# Patient Record
Sex: Female | Born: 1935 | Race: White | Hispanic: No | Marital: Married | Smoking: Never smoker
Health system: Southern US, Community
[De-identification: ages and names within clinical notes are randomized; demographics above are authoritative.]

## PROBLEM LIST (undated history)

## (undated) DIAGNOSIS — F419 Anxiety disorder, unspecified: Secondary | ICD-10-CM

## (undated) DIAGNOSIS — Z923 Personal history of irradiation: Secondary | ICD-10-CM

## (undated) DIAGNOSIS — I447 Left bundle-branch block, unspecified: Secondary | ICD-10-CM

## (undated) DIAGNOSIS — K219 Gastro-esophageal reflux disease without esophagitis: Secondary | ICD-10-CM

## (undated) DIAGNOSIS — M1990 Unspecified osteoarthritis, unspecified site: Secondary | ICD-10-CM

## (undated) DIAGNOSIS — J3089 Other allergic rhinitis: Secondary | ICD-10-CM

## (undated) DIAGNOSIS — C801 Malignant (primary) neoplasm, unspecified: Secondary | ICD-10-CM

## (undated) DIAGNOSIS — Z9221 Personal history of antineoplastic chemotherapy: Secondary | ICD-10-CM

## (undated) DIAGNOSIS — G629 Polyneuropathy, unspecified: Secondary | ICD-10-CM

## (undated) DIAGNOSIS — Z853 Personal history of malignant neoplasm of breast: Secondary | ICD-10-CM

## (undated) DIAGNOSIS — C50919 Malignant neoplasm of unspecified site of unspecified female breast: Secondary | ICD-10-CM

## (undated) DIAGNOSIS — E785 Hyperlipidemia, unspecified: Secondary | ICD-10-CM

## (undated) DIAGNOSIS — I10 Essential (primary) hypertension: Secondary | ICD-10-CM

## (undated) HISTORY — PX: BREAST SURGERY: SHX581

## (undated) HISTORY — PX: EYE SURGERY: SHX253

## (undated) HISTORY — DX: Personal history of malignant neoplasm of breast: Z85.3

## (undated) HISTORY — PX: DILATION AND CURETTAGE OF UTERUS: SHX78

## (undated) HISTORY — PX: TONSILLECTOMY: SUR1361

## (undated) HISTORY — PX: BACK SURGERY: SHX140

## (undated) HISTORY — PX: CARDIAC CATHETERIZATION: SHX172

---
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
## 1995-03-22 DIAGNOSIS — C50919 Malignant neoplasm of unspecified site of unspecified female breast: Secondary | ICD-10-CM

## 1995-03-22 DIAGNOSIS — Z923 Personal history of irradiation: Secondary | ICD-10-CM

## 1995-03-22 DIAGNOSIS — Z9221 Personal history of antineoplastic chemotherapy: Secondary | ICD-10-CM

## 1995-03-22 HISTORY — DX: Malignant neoplasm of unspecified site of unspecified female breast: C50.919

## 1995-03-22 HISTORY — DX: Personal history of antineoplastic chemotherapy: Z92.21

## 1995-03-22 HISTORY — PX: MASTECTOMY: SHX3

## 1995-03-22 HISTORY — DX: Personal history of irradiation: Z92.3

## 2004-02-27 ENCOUNTER — Ambulatory Visit: Payer: Self-pay | Admitting: General Surgery

## 2004-03-24 ENCOUNTER — Ambulatory Visit: Payer: Self-pay | Admitting: Oncology

## 2004-04-21 ENCOUNTER — Ambulatory Visit: Payer: Self-pay | Admitting: Oncology

## 2004-09-27 ENCOUNTER — Ambulatory Visit: Payer: Self-pay | Admitting: Oncology

## 2004-10-19 ENCOUNTER — Ambulatory Visit: Payer: Self-pay | Admitting: Oncology

## 2005-03-28 ENCOUNTER — Ambulatory Visit: Payer: Self-pay | Admitting: General Surgery

## 2005-03-30 ENCOUNTER — Ambulatory Visit: Payer: Self-pay | Admitting: Oncology

## 2005-04-21 ENCOUNTER — Ambulatory Visit: Payer: Self-pay | Admitting: Oncology

## 2005-09-26 ENCOUNTER — Ambulatory Visit: Payer: Self-pay | Admitting: Oncology

## 2005-10-19 ENCOUNTER — Ambulatory Visit: Payer: Self-pay | Admitting: Oncology

## 2005-11-19 ENCOUNTER — Ambulatory Visit: Payer: Self-pay | Admitting: Oncology

## 2006-01-19 ENCOUNTER — Ambulatory Visit: Payer: Self-pay | Admitting: Oncology

## 2006-01-24 ENCOUNTER — Ambulatory Visit: Payer: Self-pay | Admitting: Oncology

## 2006-04-03 ENCOUNTER — Ambulatory Visit: Payer: Self-pay | Admitting: General Surgery

## 2006-04-12 ENCOUNTER — Ambulatory Visit: Payer: Self-pay | Admitting: Oncology

## 2006-04-21 ENCOUNTER — Ambulatory Visit: Payer: Self-pay | Admitting: Oncology

## 2006-09-19 ENCOUNTER — Ambulatory Visit: Payer: Self-pay | Admitting: Oncology

## 2006-10-09 ENCOUNTER — Ambulatory Visit: Payer: Self-pay | Admitting: Oncology

## 2006-10-20 ENCOUNTER — Ambulatory Visit: Payer: Self-pay | Admitting: Oncology

## 2007-03-22 ENCOUNTER — Ambulatory Visit: Payer: Self-pay | Admitting: Oncology

## 2007-03-30 ENCOUNTER — Ambulatory Visit: Payer: Self-pay | Admitting: General Surgery

## 2007-04-18 ENCOUNTER — Ambulatory Visit: Payer: Self-pay | Admitting: Oncology

## 2007-04-22 ENCOUNTER — Ambulatory Visit: Payer: Self-pay | Admitting: Oncology

## 2007-05-15 ENCOUNTER — Inpatient Hospital Stay: Payer: Self-pay | Admitting: Internal Medicine

## 2007-05-15 ENCOUNTER — Other Ambulatory Visit: Payer: Self-pay

## 2007-06-21 ENCOUNTER — Ambulatory Visit: Payer: Self-pay | Admitting: General Surgery

## 2007-09-19 ENCOUNTER — Ambulatory Visit: Payer: Self-pay | Admitting: Oncology

## 2007-10-09 ENCOUNTER — Ambulatory Visit: Payer: Self-pay | Admitting: Internal Medicine

## 2008-04-04 ENCOUNTER — Ambulatory Visit: Payer: Self-pay | Admitting: General Surgery

## 2008-04-21 ENCOUNTER — Ambulatory Visit: Payer: Self-pay | Admitting: Oncology

## 2008-05-16 ENCOUNTER — Ambulatory Visit: Payer: Self-pay | Admitting: Oncology

## 2008-05-19 ENCOUNTER — Ambulatory Visit: Payer: Self-pay | Admitting: Oncology

## 2009-03-30 ENCOUNTER — Ambulatory Visit: Payer: Self-pay | Admitting: Ophthalmology

## 2009-04-06 ENCOUNTER — Ambulatory Visit: Payer: Self-pay | Admitting: Ophthalmology

## 2009-04-21 ENCOUNTER — Ambulatory Visit: Payer: Self-pay | Admitting: General Surgery

## 2009-05-19 ENCOUNTER — Ambulatory Visit: Payer: Self-pay | Admitting: Oncology

## 2009-06-08 ENCOUNTER — Ambulatory Visit: Payer: Self-pay | Admitting: Oncology

## 2009-08-02 ENCOUNTER — Ambulatory Visit: Payer: Self-pay

## 2010-03-23 ENCOUNTER — Ambulatory Visit: Payer: Self-pay | Admitting: Internal Medicine

## 2010-04-01 ENCOUNTER — Ambulatory Visit: Payer: Self-pay | Admitting: Internal Medicine

## 2010-04-19 ENCOUNTER — Ambulatory Visit: Payer: Self-pay | Admitting: Oncology

## 2010-04-21 ENCOUNTER — Ambulatory Visit: Payer: Self-pay | Admitting: Internal Medicine

## 2010-04-26 ENCOUNTER — Ambulatory Visit: Payer: Self-pay | Admitting: General Surgery

## 2010-04-27 ENCOUNTER — Ambulatory Visit: Payer: Self-pay | Admitting: Internal Medicine

## 2010-05-20 ENCOUNTER — Ambulatory Visit: Payer: Self-pay | Admitting: Internal Medicine

## 2010-05-24 ENCOUNTER — Ambulatory Visit: Payer: Self-pay

## 2010-06-20 ENCOUNTER — Ambulatory Visit: Payer: Self-pay | Admitting: Internal Medicine

## 2010-11-26 ENCOUNTER — Ambulatory Visit: Payer: Self-pay | Admitting: Oncology

## 2010-12-20 ENCOUNTER — Ambulatory Visit: Payer: Self-pay | Admitting: Oncology

## 2011-03-10 ENCOUNTER — Ambulatory Visit: Payer: Self-pay | Admitting: Oncology

## 2011-03-22 ENCOUNTER — Ambulatory Visit: Payer: Self-pay | Admitting: Oncology

## 2011-05-03 ENCOUNTER — Ambulatory Visit: Payer: Self-pay | Admitting: Oncology

## 2011-09-13 ENCOUNTER — Ambulatory Visit: Payer: Self-pay | Admitting: Oncology

## 2011-09-13 LAB — COMPREHENSIVE METABOLIC PANEL
Alkaline Phosphatase: 107 U/L (ref 50–136)
Anion Gap: 8 (ref 7–16)
Bilirubin,Total: 0.5 mg/dL (ref 0.2–1.0)
Co2: 28 mmol/L (ref 21–32)
Creatinine: 0.88 mg/dL (ref 0.60–1.30)
EGFR (African American): >60
EGFR (Non-African Amer.): >60
Glucose: 92 mg/dL (ref 65–99)
Osmolality: 275 (ref 275–301)
Potassium: 4.2 mmol/L (ref 3.5–5.1)
SGPT (ALT): 19 U/L
Sodium: 136 mmol/L (ref 136–145)

## 2011-09-13 LAB — CBC CANCER CENTER
Basophil #: 0.1 x10 3/mm (ref 0.0–0.1)
Eosinophil #: 0.2 x10 3/mm (ref 0.0–0.7)
HCT: 36.7 % (ref 35.0–47.0)
HGB: 11.9 g/dL — ABNORMAL LOW (ref 12.0–16.0)
Lymphocyte %: 21.7 %
MCH: 27.6 pg (ref 26.0–34.0)
MCHC: 32.5 g/dL (ref 32.0–36.0)
MCV: 85 fL (ref 80–100)
Monocyte #: 0.8 x10 3/mm (ref 0.2–0.9)
Monocyte %: 12.6 %
Neutrophil #: 3.8 x10 3/mm (ref 1.4–6.5)
Neutrophil %: 61 %
Platelet: 308 x10 3/mm (ref 150–440)
RBC: 4.32 10*6/uL (ref 3.80–5.20)
WBC: 6.3 x10 3/mm (ref 3.6–11.0)

## 2011-09-14 LAB — PROT IMMUNOELECTROPHORES(ARMC)

## 2011-09-19 ENCOUNTER — Ambulatory Visit: Payer: Self-pay | Admitting: Oncology

## 2011-10-20 ENCOUNTER — Ambulatory Visit: Payer: Self-pay | Admitting: Oncology

## 2011-12-08 ENCOUNTER — Ambulatory Visit: Payer: Self-pay | Admitting: Oncology

## 2012-03-27 ENCOUNTER — Ambulatory Visit: Payer: Self-pay | Admitting: Oncology

## 2012-03-27 LAB — CREATININE, SERUM: EGFR (Non-African Amer.): >60

## 2012-03-27 LAB — CANCER CENTER HEMOGLOBIN: HGB: 12.9 g/dL (ref 12.0–16.0)

## 2012-03-29 LAB — PROT IMMUNOELECTROPHORES(ARMC)

## 2012-04-21 ENCOUNTER — Ambulatory Visit: Payer: Self-pay | Admitting: Oncology

## 2012-05-19 ENCOUNTER — Ambulatory Visit: Payer: Self-pay | Admitting: Oncology

## 2012-09-18 ENCOUNTER — Ambulatory Visit: Payer: Self-pay | Admitting: Oncology

## 2012-09-27 LAB — CBC CANCER CENTER
Basophil %: 0.8 %
Eosinophil #: 0.2 x10 3/mm (ref 0.0–0.7)
HGB: 12 g/dL (ref 12.0–16.0)
Lymphocyte #: 1.1 x10 3/mm (ref 1.0–3.6)
Lymphocyte %: 16.8 %
MCH: 29.3 pg (ref 26.0–34.0)
MCHC: 34.7 g/dL (ref 32.0–36.0)
MCV: 85 fL (ref 80–100)
Monocyte #: 0.8 x10 3/mm (ref 0.2–0.9)
Monocyte %: 11.3 %
Neutrophil %: 68.3 %
RBC: 4.11 10*6/uL (ref 3.80–5.20)
RDW: 13.5 % (ref 11.5–14.5)
WBC: 6.7 x10 3/mm (ref 3.6–11.0)

## 2012-09-27 LAB — COMPREHENSIVE METABOLIC PANEL
Alkaline Phosphatase: 102 U/L (ref 50–136)
Anion Gap: 3 — ABNORMAL LOW (ref 7–16)
BUN: 17 mg/dL (ref 7–18)
Chloride: 98 mmol/L (ref 98–107)
Co2: 31 mmol/L (ref 21–32)
Creatinine: 0.87 mg/dL (ref 0.60–1.30)
EGFR (African American): >60
EGFR (Non-African Amer.): >60
Glucose: 103 mg/dL — ABNORMAL HIGH (ref 65–99)
Osmolality: 266 (ref 275–301)
SGOT(AST): 13 U/L — ABNORMAL LOW (ref 15–37)
SGPT (ALT): 17 U/L (ref 12–78)

## 2012-10-02 LAB — PROT IMMUNOELECTROPHORES(ARMC)

## 2012-10-19 ENCOUNTER — Ambulatory Visit: Payer: Self-pay | Admitting: Oncology

## 2013-04-04 ENCOUNTER — Ambulatory Visit: Payer: Self-pay | Admitting: Ophthalmology

## 2013-04-08 ENCOUNTER — Ambulatory Visit: Payer: Self-pay | Admitting: Ophthalmology

## 2013-04-28 ENCOUNTER — Inpatient Hospital Stay: Payer: Self-pay | Admitting: Internal Medicine

## 2013-04-28 LAB — CK TOTAL AND CKMB (NOT AT ARMC)
CK, TOTAL: 54 U/L
CK, Total: 49 U/L
CK-MB: <0.5 ng/mL — ABNORMAL LOW (ref 0.5–3.6)

## 2013-04-28 LAB — COMPREHENSIVE METABOLIC PANEL
ALBUMIN: 3.2 g/dL — AB (ref 3.4–5.0)
ALK PHOS: 98 U/L
Anion Gap: 8 (ref 7–16)
BUN: 14 mg/dL (ref 7–18)
Bilirubin,Total: 0.8 mg/dL (ref 0.2–1.0)
CALCIUM: 9.5 mg/dL (ref 8.5–10.1)
CHLORIDE: 100 mmol/L (ref 98–107)
CREATININE: 0.88 mg/dL (ref 0.60–1.30)
Co2: 24 mmol/L (ref 21–32)
EGFR (African American): >60
Glucose: 116 mg/dL — ABNORMAL HIGH (ref 65–99)
Osmolality: 266 (ref 275–301)
POTASSIUM: 3.7 mmol/L (ref 3.5–5.1)
SGOT(AST): 26 U/L (ref 15–37)
SGPT (ALT): 33 U/L (ref 12–78)
SODIUM: 132 mmol/L — AB (ref 136–145)
Total Protein: 7.7 g/dL (ref 6.4–8.2)

## 2013-04-28 LAB — URINALYSIS, COMPLETE
BACTERIA: NEGATIVE
BILIRUBIN, UR: NEGATIVE
BLOOD: NEGATIVE
GLUCOSE, UR: NEGATIVE mg/dL (ref 0–75)
Ketone: NEGATIVE
LEUKOCYTE ESTERASE: NEGATIVE
Nitrite: NEGATIVE
PH: 6 (ref 4.5–8.0)
PROTEIN: NEGATIVE
Specific Gravity: 1.017 (ref 1.003–1.030)

## 2013-04-28 LAB — CBC
HCT: 36.5 % (ref 35.0–47.0)
HGB: 12 g/dL (ref 12.0–16.0)
MCH: 28.3 pg (ref 26.0–34.0)
MCHC: 32.9 g/dL (ref 32.0–36.0)
MCV: 86 fL (ref 80–100)
Platelet: 297 10*3/uL (ref 150–440)
RBC: 4.24 10*6/uL (ref 3.80–5.20)
RDW: 13.2 % (ref 11.5–14.5)
WBC: 20.7 10*3/uL — AB (ref 3.6–11.0)

## 2013-04-28 LAB — TROPONIN I
Troponin-I: <0.02 ng/mL
Troponin-I: <0.02 ng/mL

## 2013-04-29 LAB — CBC WITH DIFFERENTIAL/PLATELET
Basophil #: 0.1 10*3/uL (ref 0.0–0.1)
Basophil %: 0.8 %
EOS ABS: 0.1 10*3/uL (ref 0.0–0.7)
Eosinophil %: 0.6 %
HCT: 29.7 % — ABNORMAL LOW (ref 35.0–47.0)
HGB: 10.1 g/dL — ABNORMAL LOW (ref 12.0–16.0)
Lymphocyte #: 0.6 10*3/uL — ABNORMAL LOW (ref 1.0–3.6)
Lymphocyte %: 6 %
MCH: 29.5 pg (ref 26.0–34.0)
MCHC: 34 g/dL (ref 32.0–36.0)
MCV: 87 fL (ref 80–100)
Monocyte #: 1.1 x10 3/mm — ABNORMAL HIGH (ref 0.2–0.9)
Monocyte %: 10.4 %
NEUTROS PCT: 82.2 %
Neutrophil #: 8.7 10*3/uL — ABNORMAL HIGH (ref 1.4–6.5)
Platelet: 195 10*3/uL (ref 150–440)
RBC: 3.42 10*6/uL — ABNORMAL LOW (ref 3.80–5.20)
RDW: 13.4 % (ref 11.5–14.5)
WBC: 10.5 10*3/uL (ref 3.6–11.0)

## 2013-04-29 LAB — BASIC METABOLIC PANEL
ANION GAP: 4 — AB (ref 7–16)
BUN: 17 mg/dL (ref 7–18)
CO2: 23 mmol/L (ref 21–32)
Calcium, Total: 8.5 mg/dL (ref 8.5–10.1)
Chloride: 104 mmol/L (ref 98–107)
Creatinine: 0.99 mg/dL (ref 0.60–1.30)
EGFR (African American): >60
EGFR (Non-African Amer.): 54 — ABNORMAL LOW
GLUCOSE: 107 mg/dL — AB (ref 65–99)
OSMOLALITY: 265 (ref 275–301)
POTASSIUM: 3.6 mmol/L (ref 3.5–5.1)
Sodium: 131 mmol/L — ABNORMAL LOW (ref 136–145)

## 2013-05-03 LAB — CULTURE, BLOOD (SINGLE)

## 2013-05-28 ENCOUNTER — Ambulatory Visit: Payer: Self-pay | Admitting: Oncology

## 2013-06-07 ENCOUNTER — Emergency Department: Payer: Self-pay | Admitting: Emergency Medicine

## 2013-08-27 DIAGNOSIS — M1990 Unspecified osteoarthritis, unspecified site: Secondary | ICD-10-CM | POA: Insufficient documentation

## 2013-10-21 ENCOUNTER — Ambulatory Visit: Payer: Self-pay | Admitting: Oncology

## 2013-10-21 LAB — CBC CANCER CENTER
BASOS ABS: 0.1 x10 3/mm (ref 0.0–0.1)
Basophil %: 1 %
EOS ABS: 0.1 x10 3/mm (ref 0.0–0.7)
EOS PCT: 0.9 %
HCT: 35.5 % (ref 35.0–47.0)
HGB: 11.9 g/dL — AB (ref 12.0–16.0)
Lymphocyte #: 1.4 x10 3/mm (ref 1.0–3.6)
Lymphocyte %: 14.4 %
MCH: 29.4 pg (ref 26.0–34.0)
MCHC: 33.5 g/dL (ref 32.0–36.0)
MCV: 88 fL (ref 80–100)
Monocyte #: 0.9 x10 3/mm (ref 0.2–0.9)
Monocyte %: 9.4 %
NEUTROS ABS: 7.2 x10 3/mm — AB (ref 1.4–6.5)
NEUTROS PCT: 74.3 %
PLATELETS: 392 x10 3/mm (ref 150–440)
RBC: 4.04 10*6/uL (ref 3.80–5.20)
RDW: 13.7 % (ref 11.5–14.5)
WBC: 9.6 x10 3/mm (ref 3.6–11.0)

## 2013-10-21 LAB — COMPREHENSIVE METABOLIC PANEL
ALBUMIN: 3.3 g/dL — AB (ref 3.4–5.0)
ANION GAP: 7 (ref 7–16)
Alkaline Phosphatase: 83 U/L
BUN: 18 mg/dL (ref 7–18)
Bilirubin,Total: 0.5 mg/dL (ref 0.2–1.0)
CHLORIDE: 99 mmol/L (ref 98–107)
CO2: 28 mmol/L (ref 21–32)
Calcium, Total: 9.9 mg/dL (ref 8.5–10.1)
Creatinine: 0.95 mg/dL (ref 0.60–1.30)
GFR CALC NON AF AMER: 57 — AB
GLUCOSE: 103 mg/dL — AB (ref 65–99)
Osmolality: 270 (ref 275–301)
Potassium: 3.9 mmol/L (ref 3.5–5.1)
SGOT(AST): 16 U/L (ref 15–37)
SGPT (ALT): 25 U/L
SODIUM: 134 mmol/L — AB (ref 136–145)
Total Protein: 7.7 g/dL (ref 6.4–8.2)

## 2013-10-24 LAB — PROT IMMUNOELECTROPHORES(ARMC)

## 2013-10-24 LAB — KAPPA/LAMBDA FREE LIGHT CHAINS (ARMC)

## 2013-11-19 ENCOUNTER — Ambulatory Visit: Payer: Self-pay | Admitting: Oncology

## 2013-11-30 DIAGNOSIS — E785 Hyperlipidemia, unspecified: Secondary | ICD-10-CM | POA: Insufficient documentation

## 2013-11-30 DIAGNOSIS — G43909 Migraine, unspecified, not intractable, without status migrainosus: Secondary | ICD-10-CM | POA: Insufficient documentation

## 2013-11-30 DIAGNOSIS — M159 Polyosteoarthritis, unspecified: Secondary | ICD-10-CM | POA: Insufficient documentation

## 2013-11-30 DIAGNOSIS — I10 Essential (primary) hypertension: Secondary | ICD-10-CM | POA: Insufficient documentation

## 2014-02-02 ENCOUNTER — Observation Stay: Payer: Self-pay | Admitting: Internal Medicine

## 2014-02-02 LAB — CBC WITH DIFFERENTIAL/PLATELET
Basophil #: 0.1 10*3/uL (ref 0.0–0.1)
Basophil %: 0.9 %
EOS ABS: 0.1 10*3/uL (ref 0.0–0.7)
Eosinophil %: 0.9 %
HCT: 38.2 % (ref 35.0–47.0)
HGB: 13 g/dL (ref 12.0–16.0)
LYMPHS ABS: 2.3 10*3/uL (ref 1.0–3.6)
LYMPHS PCT: 19.9 %
MCH: 30.2 pg (ref 26.0–34.0)
MCHC: 34.1 g/dL (ref 32.0–36.0)
MCV: 89 fL (ref 80–100)
Monocyte #: 1.1 x10 3/mm — ABNORMAL HIGH (ref 0.2–0.9)
Monocyte %: 9.5 %
Neutrophil #: 8 10*3/uL — ABNORMAL HIGH (ref 1.4–6.5)
Neutrophil %: 68.8 %
PLATELETS: 375 10*3/uL (ref 150–440)
RBC: 4.31 10*6/uL (ref 3.80–5.20)
RDW: 13.2 % (ref 11.5–14.5)
WBC: 11.7 10*3/uL — AB (ref 3.6–11.0)

## 2014-02-02 LAB — BASIC METABOLIC PANEL
ANION GAP: 11 (ref 7–16)
BUN: 22 mg/dL — AB (ref 7–18)
Calcium, Total: 9.3 mg/dL (ref 8.5–10.1)
Chloride: 100 mmol/L (ref 98–107)
Co2: 21 mmol/L (ref 21–32)
Creatinine: 0.94 mg/dL (ref 0.60–1.30)
EGFR (African American): >60
EGFR (Non-African Amer.): >60
GLUCOSE: 107 mg/dL — AB (ref 65–99)
OSMOLALITY: 268 (ref 275–301)
Potassium: 4.3 mmol/L (ref 3.5–5.1)
SODIUM: 132 mmol/L — AB (ref 136–145)

## 2014-02-02 LAB — URINALYSIS, COMPLETE
BILIRUBIN, UR: NEGATIVE
Blood: NEGATIVE
GLUCOSE, UR: NEGATIVE mg/dL (ref 0–75)
Ketone: NEGATIVE
Nitrite: NEGATIVE
PH: 6 (ref 4.5–8.0)
Protein: NEGATIVE
RBC,UR: 5 /HPF (ref 0–5)
Specific Gravity: 1.017 (ref 1.003–1.030)
Transitional Epi: <1
WBC UR: 10 /HPF (ref 0–5)

## 2014-02-02 LAB — CK-MB: CK-MB: 0.7 ng/mL (ref 0.5–3.6)

## 2014-02-02 LAB — TROPONIN I

## 2014-02-03 LAB — HEMOGLOBIN A1C: Hemoglobin A1C: 6.1 % (ref 4.2–6.3)

## 2014-02-03 LAB — TROPONIN I: Troponin-I: <0.02 ng/mL

## 2014-02-03 LAB — CK-MB
CK-MB: 0.8 ng/mL (ref 0.5–3.6)
CK-MB: 1.1 ng/mL (ref 0.5–3.6)

## 2014-02-03 LAB — TSH: Thyroid Stimulating Horm: 1.7 u[IU]/mL

## 2014-03-21 HISTORY — PX: KYPHOPLASTY: SHX5884

## 2014-05-13 ENCOUNTER — Ambulatory Visit: Payer: Self-pay | Admitting: Physician Assistant

## 2014-05-19 ENCOUNTER — Ambulatory Visit: Payer: Self-pay | Admitting: Orthopedic Surgery

## 2014-05-20 ENCOUNTER — Ambulatory Visit: Payer: Self-pay | Admitting: Orthopedic Surgery

## 2014-06-17 DIAGNOSIS — C8800 Waldenstrom macroglobulinemia not having achieved remission: Secondary | ICD-10-CM | POA: Insufficient documentation

## 2014-06-17 DIAGNOSIS — M810 Age-related osteoporosis without current pathological fracture: Secondary | ICD-10-CM | POA: Insufficient documentation

## 2014-06-25 ENCOUNTER — Ambulatory Visit
Admit: 2014-06-25 | Disposition: A | Discharge status: Home / Self Care | Payer: Self-pay | Attending: Internal Medicine | Admitting: Internal Medicine

## 2014-07-12 NOTE — Discharge Summary (Signed)
PATIENT NAME:  Carly Peck, Carly Peck MR#:  [id number] DATE OF BIRTH:  08/03/1931  DATE OF ADMISSION:  04/28/2013 DATE OF DISCHARGE: 04/30/2013   DISCHARGE DIAGNOSES:  1.  Cellulitis. 2.  Sepsis syndrome.  3.  Syncope from above. 4.  History of osteoarthritis.  5.  Hypotension associated with the above, now improved.   DISCHARGE MEDICATIONS: Per [facility] medication reconciliation form. Please see for details.   HISTORY AND PHYSICAL: Please see detailed history and physical done on admission.   HOSPITAL COURSE: The patient was admitted hypotensive, status post syncopal event and cellulitis on her left leg after a cat bite at home. She was put on multiple broad-spectrum antibiotics on admission. She had some nausea with that, which was relieved with supplying her  regimen. White blood cell count was initially 20,000 and improved to normal by the time of discharge. She had a echocardiogram, which showed normal LV function. She had minimal carotid plaques on a carotid ultrasound. She was ambulating in her room to the bathroom without difficulty. She was discharged home as long as she tolerates her breakfast and lunch today to ensure she is eating well and her blood pressure stays in the normal range on just losartan. She will take Augmentin 875 b.i.d. for a week further prior to discharge.   TIME SPENT: Please note, it took approximately 33 minutes to do all discharge tasks today.  ____________________________ Ocie Cornfield. Ouida Sills, MD mwa:aw D: 04/30/2013 08:02:13 ET T: 04/30/2013 09:04:05 ET JOB#: [id number]  cc: Ocie Cornfield. Ouida Sills, MD, <Dictator> Kirk Ruths MD ELECTRONICALLY SIGNED 05/01/2013 14:04

## 2014-07-12 NOTE — H&P (Signed)
PATIENT NAME:  Carly Peck, Carly Peck MR#:  [id number] DATE OF BIRTH:  09-17-1931  DATE OF ADMISSION:  04/28/2013  REFERRING PHYSICIAN: Dr. Benjaman Lobe.   FAMILY PHYSICIAN: Dr. Frazier Richards.   REASON FOR ADMISSION: Left lower extremity cellulitis.   HISTORY OF PRESENT ILLNESS: The patient is a 81 year old female with a history of breast cancer status post left mastectomy, chronic left bundle branch block with negative cardiac catheterization in the past, as well as Waldenstrom macroglobulinemia. Presents to the Emergency Room with some atypical chest pain, fevers, chills, and progressive left lower extremity pain, swelling, and erythema. Was bitten by a cat 3 to 4 days ago. In the Emergency Room, the patient was noted to be somewhat hypotensive. Had a syncopal episode in the Emergency Room. Her white count is elevated. Leg is erythematous and tender. She is now admitted for further evaluation. The patient does have occasional chest pain. None now. She has had atypical chest pain since her mastectomy. Has a left bundle branch block and negative cardiac catheterization associated with this.   PAST MEDICAL HISTORY: 1. Chronic left bundle branch block with negative cardiac catheterization.  2. Breast cancer last status post left mastectomy.  3. Waldenstrom macroglobulinemia.  4. Benign hypertension.  5. Hyperlipidemia.  6. Osteoarthritis.  7. Environmental allergies with allergic rhinitis.  8. Previous cataract surgery.   MEDICATIONS: 1. Zocor 40 mg p.o. at bedtime.  2. Omeprazole 20 mg p.o. daily.  3. Hyzaar 100/12.5 mg 1 p.o. daily.  4. Flonase 2 puffs each nostril daily.  5. Celebrex 200 mg p.o. daily.  6. Amlodipine 5 mg p.o. b.i.d.   ALLERGIES: ULTRAM.   SOCIAL HISTORY: The patient is married. No history of alcohol or tobacco abuse. Works as a Psychologist, occupational here at the hospital.   FAMILY HISTORY: Positive for coronary artery disease and stroke. Negative for breast or colon cancer.   REVIEW  OF SYSTEMS:  CONSTITUTIONAL: The patient has had fever but no change in weight.  EYES: No blurred or double vision. No glaucoma.  ENT: No tinnitus or hearing loss. No nasal discharge or bleeding. No difficulty swallowing.  RESPIRATORY: No cough or wheezing. Denies hemoptysis. No painful respiration.  CARDIOVASCULAR: No orthopnea or palpitations.  GASTROINTESTINAL: Some nausea but no vomiting or diarrhea. No abdominal pain. No change in bowel habits.  GENITOURINARY: No dysuria or hematuria. No incontinence.  ENDOCRINE: No polyuria or polydipsia. No heat or cold intolerance.  HEMATOLOGIC: The patient denies anemia, easy bruising, or bleeding.  LYMPHATIC: No swollen glands.  MUSCULOSKELETAL: The patient denies pain in her neck, back, shoulders, knees, or hips. No gout.  NEUROLOGIC: No numbness or migraines. Denies stroke or seizures.  PSYCHIATRIC: The patient denies anxiety, insomnia or depression.   PHYSICAL EXAMINATION: GENERAL: The patient is in no acute distress.  VITAL SIGNS: Currently remarkable for a blood pressure of 108/53, heart rate 97, respiratory rate of 20, temperature of 98.1. Sats are 99% on room air.  HEENT: Normocephalic, atraumatic. Pupils equally round and reactive to light and accommodation. Extraocular movements are intact. Sclerae are nonicteric. Conjunctivae are clear.  OROPHARYNX: Clear.  NECK: Supple without JVD or bruits. No adenopathy or thyromegaly is noted.  LUNGS: Clear to auscultation and percussion without wheezes, rales or rhonchi. No dullness. Respiratory effort is normal.   CARDIAC: Regular rate and rhythm. Normal S1, S2. No significant rubs, murmurs or gallops. PMI is nondisplaced. Chest wall is nontender.  ABDOMEN: Soft, nontender, with normoactive bowel sounds. No organomegaly or masses were appreciated. No hernias  or bruits were noted.  EXTREMITIES: Revealed 1+ edema to the left lower extremity with erythema and tenderness. Cat bite area was noted. No  purulence. Pulses were 2+ bilaterally.  SKIN: Warm and dry without other rash noted.  NEUROLOGIC: Cranial nerves II through XII grossly intact. Deep tendon reflexes were symmetric. Motor and sensory examination is nonfocal.  PSYCHIATRIC: Exam revealed a patient who is alert and oriented to person, place, and time. She was cooperative and used good judgment.   LABORATORY DATA: EKG revealed sinus tachycardia with a left bundle branch block. Chest x-ray was unremarkable. Her white count was 20.7 with a hemoglobin of 12.0. Glucose 116 with a BUN of 14, creatinine of 0.88 with a GFR of greater than 60. Sodium 132 with a potassium of 3.7. Troponin less than 0.02.   ASSESSMENT: 1. Left lower extremity cellulitis after a cat bite. 2. Atypical chest pain.  3. Syncope, presumably vasovagal.  4. Hyponatremia.  5. Chronic left bundle branch block.  6. Waldenstrom macroglobulinemia.  7. Relative hypotension.   PLAN: The patient will be admitted to telemetry with IV fluids and IV antibiotics. Blood cultures have been sent. We will hold her Norvasc because of her relative hypotension. We will follow serial cardiac enzymes because of her chest pain. We will also obtain an echocardiogram and carotid Dopplers because of her syncope. Monitor on telemetry. Follow up routine labs in the morning. physical therapy and care management consult have been placed. Further treatment and evaluation will depend upon the patient's progress.   TOTAL TIME SPENT ON THIS PATIENT: 50 minutes.    ____________________________ Leonie Douglas Doy Hutching, MD jds:sg D: 04/28/2013 10:55:03 ET T: 04/28/2013 14:01:27 ET JOB#: [id number]  cc: Leonie Douglas. Doy Hutching, MD, <Dictator> Ocie Cornfield. Ouida Sills, MD  Jiovani Mccammon Lennice Sites MD ELECTRONICALLY SIGNED 04/28/2013 15:27

## 2014-07-12 NOTE — H&P (Signed)
PATIENT NAME:  Carly Peck, Carly Peck MR#:  [id number] DATE OF BIRTH:  [birth year]  DATE OF ADMISSION:  02/02/2014  REFERRING PHYSICIAN:  Sheryl L. Benjaman Lobe, MD   PRIMARY CARE PHYSICIAN:  Ocie Cornfield. Ouida Sills, MD   ADMISSION DIAGNOSIS:  Syncope.   HISTORY OF PRESENT ILLNESS:  This is an 81 year old Caucasian female who presents to the Emergency Department via EMS due to syncope. The patient states that she was feeling relatively well this afternoon while shopping and came home with a pizza to eat. She denies any nausea as she ate her pizza and had a few sips of wine. She began to feel lightheaded and dizzy and was able to notify her husband prior to losing consciousness. She did not fall from her barstool, as may have been originally reported in the Emergency Department. Her husband helped her down. She did not hit her head. She had no seizure activity or loss of continence. She reports that her husband told her she was unconscious for 5 to 6 minutes. When she regained consciousness, she felt somewhat flushed and diaphoretic. She denies any chest pain or shortness of breath prior to fainting. She admits that she had gone to the urgent care earlier that day to get some antibiotics for a sinus infection. She had taken one dose of amoxicillin prior to this episode. Due to syncope of unknown etiology, the Emergency Department called for admission.   REVIEW OF SYSTEMS: CONSTITUTIONAL:  The patient admits to some subjective fevers but denies weakness or fatigue.  EYES:  Denies blurred vision or inflammation.  EARS, NOSE, AND THROAT:  Denies tinnitus but admits to sinus pressure and a funny feeling in her ears.  RESPIRATORY:  Admits to cough, but denies shortness of breath.  CARDIOVASCULAR:  Denies chest pain, palpitations, orthopnea, or paroxysmal nocturnal dyspnea.  GASTROINTESTINAL:  Admits to some nausea but denies vomiting or diarrhea. The patient admits to abdominal pain currently that is mild and is likely  from coughing.  GENITOURINARY:  Denies dysuria, increased frequency, or hesitancy.  ENDOCRINE:  Denies polyuria or polydipsia.  INTEGUMENTARY:  Denies rashes or lesions.  HEMATOLOGIC AND LYMPHATIC:  Denies easy bruising or bleeding.  MUSCULOSKELETAL:  Admits to arthralgias, but denies myalgia.  NEUROLOGIC:  Denies numbness in her extremities or dysarthria.  PSYCHIATRIC:  Denies depression or suicidal ideation.   PAST MEDICAL HISTORY:  Hypertension, osteoarthritis, left shoulder bursitis, torn right shoulder rotator cuff, Waldenstrom agammaglobulinemia, history of sepsis secondary to cellulitis due to a cat bite, and history of breast cancer status post chemotherapy.   PAST SURGICAL HISTORY:  Left mastectomy, tonsillectomy and adenoidectomy, dilatation and curettage x 2, temporal artery biopsy, and cataract surgery.   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 She does not smoke or do drugs. She occasionally has some wine to drink.   FAMILY HISTORY:  Her mother is deceased of complications of dementia and possible congestive heart failure. Her father is deceased of arterial sclerosis. He was a smoker.    MEDICATIONS:  1.  Acetaminophen 325 mg 2 tablets p.o. b.i.d. as needed.  2.  Carvedilol 3.125 mg 1 tab p.o. b.i.d.  3.  Celebrex 200 mg 1 capsule p.o. daily.  4.  Cetirizine 10 mg 1 tablet p.o. daily.  5.  Flonase 50 mcg/inhalation one spray to each nostril once a day.  6.  Hydrochlorothiazide with losartan 12.5 mg/100 mg 1 tablet p.o. daily.  7.  Omeprazole 1 capsule p.o. daily.  8.  Simvastatin 40  mg 1 tablet p.o. at bedtime.   ALLERGIES:  ULTRAM.   PERTINENT LABORATORY RESULTS AND RADIOGRAPHIC FINDINGS:  Serum glucose is 107, BUN 22, creatinine 0.94, sodium 132, potassium 4.3, chloride 100, bicarbonate 21, and calcium is 9.3. Troponin is negative x 3 sets. TSH is 1.7. White blood cell count is 11.7, hemoglobin 13, hematocrit 38.2, and platelet count is 375,000.  Urinalysis shows 10 white blood cells per high-power field. It is nitrite negative and leukocyte esterase negative.   CT scan of the head without contrast shows no acute intracranial abnormality.   PHYSICAL EXAMINATION: VITAL SIGNS:  Temperature is 98, pulse 84, respirations 18, blood pressure 121/67, pulse oximetry 97% on room air.  GENERAL:  The patient is alert and oriented x 3 and in no apparent distress.  HEENT:  Normocephalic, atraumatic. Pupils are equal, round, and reactive to light and accommodation. Extraocular movements are intact. Mucous membranes are moist.  NECK:  Trachea is midline. No adenopathy.  CHEST:  Symmetric, atraumatic.  CARDIOVASCULAR:  Regular rate and rhythm. Normal S1, S2. No rubs, clicks, or murmurs appreciated.  LUNGS:  Clear to auscultation bilaterally. Normal effort and excursion.  ABDOMEN:  Positive bowel sounds. Soft. Mildly tender diffusely without guarding or rebound tenderness. No hepatosplenomegaly.  GENITOURINARY:  Deferred.  MUSCULOSKELETAL:  The patient moves all 4 extremities equally. There is 5/5 strength in upper and lower extremities bilaterally.  SKIN:  No rashes or lesions.  EXTREMITIES:  No clubbing, cyanosis, or edema.  NEUROLOGIC:  Cranial nerves II through XII are grossly intact. There is no dysmetria.  PSYCHIATRIC:  Mood is normal. Affect is congruent.   ASSESSMENT AND PLAN:  This is an 81 year old female admitted for syncope.   1.  Syncope. No head trauma. The patient had a distinct prodrome, and there does not seem to be any history of seizure behavior. The etiology of this episode is likely a combination of antibiotics with a small amount of alcohol or air-fluid levels in her sinuses, which can create some dizziness, plus subjective fever and/or exhaustion and mild dehydration. The patient has no arrhythmias reported by telemetry since she has been admitted to the floor. She has a known left bundle branch block. She denies any chest pain.  We will continue to monitor her on telemetry and cycle her cardiac enzymes.  2.  Sinus infection. The patient was given a prescription for amoxicillin. We will give her one dose of IV ceftriaxone while she is in the hospital, which will hopefully help [place] get ahead of this infection and help her clear it faster. We will continue cetirizine and fluticasone for symptomatic relief.  3.  Hyponatremia, mild. This is an unlikely cause of syncope. It is likely secondary to mild dehydration, and we have the patient on intravenous fluid to help hydrate her while she is sick.  4.  Leukocytosis secondary to infection. The patient does not meet septic criteria.  5.  Sterile pyuria. The patient is asymptomatic for urinary tract infection.  6.  Hypertension. Continue Coreg as well as hydrochlorothiazide with losartan.  7.  Hyperlipidemia. Continue statin therapy.  8.  Deep vein thrombosis prophylaxis. Continue subcutaneous heparin.  9.  Gastrointestinal prophylaxis is unnecessary, as the patient is not critically ill.   CODE STATUS:  The patient is a full code.   TIME SPENT ON ADMISSION ORDERS AND PATIENT CARE:  Approximately 40 minutes.     ____________________________ Norva Riffle. Marcille Blanco, MD msd:nb D: 02/03/2014 06:00:03 ET T: 02/03/2014 07:13:46 ET JOB#: [id number]  cc: Norva Riffle. Marcille Blanco, MD, <Dictator> Norva Riffle Edrick Whitehorn MD ELECTRONICALLY SIGNED 02/06/2014 4:37

## 2014-07-12 NOTE — Op Note (Signed)
PATIENT NAME:  Carly Peck, Carly Peck MR#:  [id number] DATE OF BIRTH:  12-16-1931  DATE OF PROCEDURE:  04/08/2013  PREOPERATIVE DIAGNOSIS: Cataract, right eye.   POSTOPERATIVE DIAGNOSIS: Cataract, right eye.  PROCEDURE PERFORMED:  Extracapsular cataract extraction using phacoemulsification with placement of an Alcon SN60WF, 23.5-diopter posterior chamber lens, serial # [id number].  SURGEON:  Loura Back. Lauris Keepers, MD  ASSISTANT:  None.  ANESTHESIA:  4% lidocaine and 0.75% Marcaine in a 50/50 mixture of 10 units/mL of Hylenex added, given as a peribulbar.  ANESTHESIOLOGIST:  Dr. Myra Gianotti.   COMPLICATIONS:  None.  ESTIMATED BLOOD LOSS:  Less than 1 ml.  DESCRIPTION OF PROCEDURE:  The patient was brought to the operating room and given a peribulbar block.  The patient was then prepped and draped in the usual fashion.  The vertical rectus muscles were imbricated using 5-0 silk sutures.  These sutures were then clamped to the sterile drapes as bridle sutures.  A limbal peritomy was performed extending two clock hours and hemostasis was obtained with cautery.  A partial thickness scleral groove was made at the surgical limbus and dissected anteriorly in a lamellar dissection using an Alcon crescent knife.  The anterior chamber was entered superonasally with a Superblade and through the lamellar dissection with a 2.6 mm keratome.  DisCoVisc was used to replace the aqueous and a continuous tear capsulorrhexis was carried out.  Hydrodissection and hydrodelineation were carried out with balanced salt and a 27 gauge canula.  The nucleus was rotated to confirm the effectiveness of the hydrodissection.  Phacoemulsification was carried out using a divide-and-conquer technique.  Total ultrasound time was 1 minute and 23 seconds with an average power of 21.2 percent.  CDE of 32.06.  Irrigation/aspiration was used to remove the residual cortex.  DisCoVisc was used to inflate the capsule and the internal incision was  enlarged to 3 mm with the crescent knife.  The intraocular lens was folded and inserted into the capsular bag using the AcrySert delivery system.  Irrigation/aspiration was used to remove the residual DisCoVisc.  Miostat was injected into the anterior chamber through the paracentesis track to inflate the anterior chamber and induce miosis.  0.10 mL of cefuroxime was injected via the paracentesis tract containing 1 mg of drug. The wound was checked for leaks and none were found. The conjunctiva was closed with cautery and the bridle sutures were removed.  Two drops of 0.3% Vigamox were placed on the eye.   An eye shield was placed on the eye.  The patient was discharged to the recovery room in good condition.     ____________________________ Loura Back Yariana Hoaglund, MD sad:dmm D: 04/08/2013 13:12:17 ET T: 04/08/2013 20:52:45 ET JOB#: [id number]  cc: Remo Lipps A. Arley Garant, MD, <Dictator> Martie Lee MD ELECTRONICALLY SIGNED 04/15/2013 13:35

## 2014-07-14 LAB — SURGICAL PATHOLOGY

## 2014-07-20 NOTE — Op Note (Signed)
PATIENT NAME:  Carly Peck, Carly Peck MR#:  [id number] DATE OF BIRTH:  30-Sep-1931  DATE OF PROCEDURE:  05/20/2014  PREOPERATIVE DIAGNOSIS: L1 compression fracture.   POSTOPERATIVE DIAGNOSIS: L1 compression fracture.   PROCEDURE: L1 kyphoplasty.   ANESTHESIA: MAC.   SURGEON: Hessie Knows, MD   DESCRIPTION OF PROCEDURE: The patient was brought to the operating room and after adequate anesthesia was obtained, the patient was placed prone. C-arm was brought in and good visualization of L1 was obtained in both AP and lateral projections. Local anesthetic was infiltrated on either side, 5 mL of 1% Xylocaine, after prepping with alcohol and after having completed timeout procedure. The back was then prepped and draped in the usual sterile manner, appropriate patient identification and timeout procedure completed. A spinal needle was used to get local anesthetic down to the pedicle on the right side. Trocar was advanced visualized as it went into the vertebral body and a biopsy obtained. Drilling was carried out and a balloon inflated, which crossed the midline to about 2.5 mL. Bone cement was then infiltrated with the appropriate consistency, filling the vertebral body with approximately 4 mL of bone cement. Trocar was removed and without extravasation. Permanent C-arm views obtained. The wound was closed with Dermabond and a Band-Aid. The patient was sent to the recovery room in stable condition.   ESTIMATED BLOOD LOSS: Minimal.   COMPLICATIONS: None.   SPECIMEN: L1 vertebral body biopsy.   ____________________________ Laurene Footman, MD mjm:TM D: 05/20/2014 18:39:00 ET T: 05/21/2014 01:21:06 ET JOB#: [id number]  cc: Laurene Footman, MD, <Dictator> Laurene Footman MD ELECTRONICALLY SIGNED 05/21/2014 7:31

## 2014-09-24 ENCOUNTER — Emergency Department
Admission: EM | Admit: 2014-09-24 | Discharge: 2014-09-24 | Disposition: A | Discharge status: Home / Self Care | Payer: Medicare Other | Attending: Emergency Medicine | Admitting: Emergency Medicine

## 2014-09-24 ENCOUNTER — Encounter: Payer: Self-pay | Admitting: *Deleted

## 2014-09-24 ENCOUNTER — Other Ambulatory Visit: Payer: Self-pay

## 2014-09-24 DIAGNOSIS — R55 Syncope and collapse: Secondary | ICD-10-CM | POA: Diagnosis present

## 2014-09-24 DIAGNOSIS — Y9222 Religious institution as the place of occurrence of the external cause: Secondary | ICD-10-CM | POA: Diagnosis not present

## 2014-09-24 DIAGNOSIS — Y9389 Activity, other specified: Secondary | ICD-10-CM | POA: Diagnosis not present

## 2014-09-24 DIAGNOSIS — I10 Essential (primary) hypertension: Secondary | ICD-10-CM | POA: Diagnosis not present

## 2014-09-24 DIAGNOSIS — Z79899 Other long term (current) drug therapy: Secondary | ICD-10-CM | POA: Diagnosis not present

## 2014-09-24 DIAGNOSIS — X30XXXA Exposure to excessive natural heat, initial encounter: Secondary | ICD-10-CM | POA: Diagnosis not present

## 2014-09-24 DIAGNOSIS — Y998 Other external cause status: Secondary | ICD-10-CM | POA: Diagnosis not present

## 2014-09-24 DIAGNOSIS — T671XXA Heat syncope, initial encounter: Secondary | ICD-10-CM | POA: Diagnosis not present

## 2014-09-24 HISTORY — DX: Essential (primary) hypertension: I10

## 2014-09-24 HISTORY — DX: Malignant (primary) neoplasm, unspecified: C80.1

## 2014-09-24 LAB — URINALYSIS COMPLETE WITH MICROSCOPIC (ARMC ONLY)
BACTERIA UA: NONE SEEN
BILIRUBIN URINE: NEGATIVE
GLUCOSE, UA: NEGATIVE mg/dL
Hgb urine dipstick: NEGATIVE
KETONES UR: NEGATIVE mg/dL
Nitrite: NEGATIVE
PH: 7 (ref 5.0–8.0)
Protein, ur: NEGATIVE mg/dL
SPECIFIC GRAVITY, URINE: 1.011 (ref 1.005–1.030)
Trans Epithel, UA: <1

## 2014-09-24 LAB — COMPREHENSIVE METABOLIC PANEL
ALBUMIN: 3.7 g/dL (ref 3.5–5.0)
ALK PHOS: 68 U/L (ref 38–126)
ALT: 13 U/L — AB (ref 14–54)
AST: 20 U/L (ref 15–41)
Anion gap: 10 (ref 5–15)
BUN: 16 mg/dL (ref 6–20)
CALCIUM: 9.4 mg/dL (ref 8.9–10.3)
CO2: 22 mmol/L (ref 22–32)
Chloride: 95 mmol/L — ABNORMAL LOW (ref 101–111)
Creatinine, Ser: 1.02 mg/dL — ABNORMAL HIGH (ref 0.44–1.00)
GFR calc non Af Amer: 50 mL/min — ABNORMAL LOW (ref >60)
GFR, EST AFRICAN AMERICAN: 58 mL/min — AB (ref >60)
Glucose, Bld: 109 mg/dL — ABNORMAL HIGH (ref 65–99)
POTASSIUM: 3.6 mmol/L (ref 3.5–5.1)
Sodium: 127 mmol/L — ABNORMAL LOW (ref 135–145)
TOTAL PROTEIN: 7.4 g/dL (ref 6.5–8.1)
Total Bilirubin: 0.7 mg/dL (ref 0.3–1.2)

## 2014-09-24 LAB — CBC WITH DIFFERENTIAL/PLATELET
Basophils Absolute: 0.1 10*3/uL (ref 0–0.1)
Basophils Relative: 1 %
EOS ABS: 0.1 10*3/uL (ref 0–0.7)
Eosinophils Relative: 1 %
HCT: 33.2 % — ABNORMAL LOW (ref 35.0–47.0)
HEMOGLOBIN: 11.5 g/dL — AB (ref 12.0–16.0)
LYMPHS ABS: 1.6 10*3/uL (ref 1.0–3.6)
LYMPHS PCT: 18 %
MCH: 29.8 pg (ref 26.0–34.0)
MCHC: 34.6 g/dL (ref 32.0–36.0)
MCV: 86.2 fL (ref 80.0–100.0)
MONO ABS: 0.9 10*3/uL (ref 0.2–0.9)
Monocytes Relative: 10 %
Neutro Abs: 6.4 10*3/uL (ref 1.4–6.5)
Neutrophils Relative %: 70 %
Platelets: 316 10*3/uL (ref 150–440)
RBC: 3.86 MIL/uL (ref 3.80–5.20)
RDW: 13.3 % (ref 11.5–14.5)
WBC: 9.1 10*3/uL (ref 3.6–11.0)

## 2014-09-24 LAB — TROPONIN I: Troponin I: <0.03 ng/mL (ref <0.031)

## 2014-09-24 NOTE — Discharge Instructions (Signed)
Drink plenty of fluids today and tomorrow. Avoid high or heat situations. Follow-up with Dr. Ouida Sills. It may be beneficial to wear a cardiac monitor for a few days to look for any rhythm disturbance. Return to the emergency department if you have another episode of fainting, if you have chest pain, shortness of breath, or if you have other urgent concerns.  Heat-Related Illness Heat-related illnesses occur when the body is unable to properly cool itself. The body normally cools itself by sweating. However, under some conditions sweating is not enough. In these cases, a person's body temperature rises rapidly. Very high body temperatures may damage the brain or other vital organs. Some examples of heat-related illnesses include:  Heat stroke. This occurs when the body is unable to regulate its temperature. The body's temperature rises rapidly, the sweating mechanism fails, and the body is unable to cool down. Body temperature may rise to 106 F (41 C) or higher within 10 to 15 minutes. Heat stroke can cause death or permanent disability if emergency treatment is not provided.  Heat exhaustion. This is a milder form of heat-related illness that can develop after several days of exposure to high temperatures and not enough fluids. It is the body's response to an excessive loss of the water and salt contained in sweat.  Heat cramps. These usually affect people who sweat a lot during heavy activity. This sweating drains the body's salt and moisture. The low salt level in the muscles causes painful cramps. Heat cramps may also be a symptom of heat exhaustion. Heat cramps usually occur in the abdomen, arms, or legs. Get medical attention for cramps if you have heart problems or are on a low-sodium diet. Those that are at greatest risk for heat-related illnesses include:   The elderly.  Infant and the very young.  People with mental illness and chronic diseases.  People who are overweight  (obese).  Young and healthy people can even succumb to heat if they participate in strenuous physical activities during hot weather. CAUSES  Several factors affect the body's ability to cool itself during extremely hot weather. When the humidity is high, sweat will not evaporate as quickly. This prevents the body from releasing heat quickly. Other factors that can affect the body's ability to cool down include:   Age.  Obesity.  Fever.  Dehydration.  Heart disease.  Mental illness.  Poor circulation.  Sunburn.  Prescription drug use.  Alcohol use. SYMPTOMS  Heat stroke: Warning signs of heat stroke vary, but may include:  An extremely high body temperature (above 103F orally).  A fast, strong pulse.  Dizziness.  Confusion.  Red, hot, and dry skin.  No sweating.  Throbbing headache.  Feeling sick to your stomach (nauseous).  Unconsciousness. Heat exhaustion: Warning signs of heat exhaustion include:  Heavy sweating.  Tiredness.  Headache.  Paleness.  Weakness.  Feeling sick to your stomach (nauseous) or vomiting.  Muscle cramps. Heat cramps  Muscle pains or spasms. TREATMENT  Heat stroke  Get into a cool environment. An indoor place that is air-conditioned may be best.  Take a cool shower or bath. Have someone around to make sure you are okay.  Take your temperature. Make sure it is going down. Heat exhaustion  Drink plenty of fluids. Do not drink liquids that contain caffeine, alcohol, or large amounts of sugar. These cause you to lose more body fluid. Also, avoid very cold drinks. They can cause stomach cramps.  Get into a cool environment. An indoor  place that is air-conditioned may be best.  Take a cool shower or bath. Have someone around to make sure you are okay.  Put on lightweight clothing. Heat cramps  Stop whatever activity you were doing. Do not attempt to do that activity for at least 3 hours after the cramps have gone  away.  Get into a cool environment. An indoor place that is air-conditioned may be best. [place]  To protect your health when temperatures are extremely high, follow these tips:  During heavy exercise in a hot environment, drink two to four glasses (16-32 ounces) of cool fluids each hour. Do not wait until you are thirsty to drink. Warning: If your caregiver limits the amount of fluid you drink or has you on water pills, ask how much you should drink while the weather is hot.  Do not drink liquids that contain caffeine, alcohol, or large amounts of sugar. These cause you to lose more body fluid.  Avoid very cold drinks. They can cause stomach cramps.  Wear appropriate clothing. Choose lightweight, light-colored, loose-fitting clothing.  If you must be outdoors, try to limit your outdoor activity to morning and evening hours. Try to rest often in shady areas.  If you are not used to working or exercising in a hot environment, start slowly and pick up the pace gradually.  Stay cool in an air-conditioned place if possible. If your home does not have air conditioning, go to the shopping mall or [employer].  Taking a cool shower or bath may help you cool off. SEEK MEDICAL CARE IF:   You see any of the symptoms listed above. You may be dealing with a life-threatening emergency.  Symptoms worsen or last longer than 1 hour.  Heat cramps do not get better in 1 hour. MAKE SURE YOU:   Understand these instructions.  Will watch your condition.  Will get help right away if you are not doing well or get worse. Document Released: 12/15/2007 Document Revised: 05/30/2011 Document Reviewed: 12/15/2007 [facility] Patient Information 2015 [place], [place]. This information is not intended to replace advice given to you by your health care provider. Make sure you discuss any questions you have with your health care provider.

## 2014-09-24 NOTE — ED Notes (Addendum)
Pt experienced multiple syncopal episodes while sitting down on bench today after volunteering. Denies falling. Witnessed syncope, states that she slumped over. Pt working outside today with little to eat or drink. Denies pain at this time. CBG 120's with EMS. Pt arrives wet due to water being put on her before EMS arrival. Pt alert and oriented X4, active, cooperative, pt in NAD. RR even and unlabored, color WNL.  Family with patient at this time Pt received full 500cc NS that EMS started.

## 2014-09-24 NOTE — ED Notes (Signed)
MD at bedside. 

## 2014-09-24 NOTE — ED Provider Notes (Signed)
[facility] Emergency Department Provider Note  ____________________________________________  Time seen: 1340  I have reviewed the triage vital signs and the nursing notes.   HISTORY  Chief Complaint Loss of Consciousness Syncope   HPI Carly Peck is a 81 y.o. female who appears younger than stated age. She is pleasant and interactive. She was at church today volunteering. They were waiting for a truck to deliver some items. She had a prolonged time waiting for the truck and she thinks she may have gotten rather warm. The truck arrived and they was moving things around and she started to feel weak. Her husband came to her side and the patient fainted next to him. She was having some visual difficulties and continued to have waxing and waning consciousness. She reports that she continued to have difficulty until she was in the ambulance in route to the hospital, when her vision seemed to clear up and improved and her mentation was better.  She denies any chest pain, palpitations, or shortness of breath.  There is no seizure-like activity witnessed    Past Medical History  Diagnosis Date  . Hypertension   . Cancer     There are no active problems to display for this patient.   Past Surgical History  Procedure Laterality Date  . Mastectomy    . Back surgery      Current Outpatient Rx  Name  Route  Sig  Dispense  Refill  . acetaminophen (TYLENOL) 500 MG tablet   Oral   Take 1,000 mg by mouth every 6 (six) hours as needed for mild pain or headache.         . carvedilol (COREG) 3.125 MG tablet   Oral   Take 3.125 mg by mouth 2 (two) times daily.         . celecoxib (CELEBREX) 200 MG capsule   Oral   Take 200 mg by mouth daily.         . cetirizine (ZYRTEC) 10 MG tablet   Oral   Take 10 mg by mouth daily.         . cholecalciferol (VITAMIN D) 1000 UNITS tablet   Oral   Take 1,000 Units by mouth daily.         . fluticasone  (FLONASE) 50 MCG/ACT nasal spray   Each Nare   Place 2 sprays into both nostrils 2 (two) times daily as needed for rhinitis.         Marland Kitchen losartan-hydrochlorothiazide (HYZAAR) 100-12.5 MG per tablet   Oral   Take 1 tablet by mouth daily.         Marland Kitchen omeprazole (PRILOSEC) 20 MG capsule   Oral   Take 20 mg by mouth daily.         . sennosides-docusate sodium (SENOKOT-S) 8.6-50 MG tablet   Oral   Take 3 tablets by mouth at bedtime.         . simvastatin (ZOCOR) 40 MG tablet   Oral   Take 40 mg by mouth at bedtime.           Allergies Ultram  History reviewed. No pertinent family history.  Social History History  Substance Use Topics  . Smoking status: Never Smoker   . Smokeless tobacco: Not on file  . Alcohol Use: No    Review of Systems  Constitutional: Negative for fever. ENT: Negative for sore throat. Cardiovascular: Negative for chest pain. - positive for syncope. See history of present illness Respiratory:  Negative for shortness of breath. Gastrointestinal: Negative for abdominal pain, vomiting and diarrhea. Genitourinary: Negative for dysuria. Musculoskeletal: No myalgias or injuries. Skin: Negative for rash. Neurological: Negative for headaches   10-point ROS otherwise negative.  ____________________________________________   PHYSICAL EXAM:  VITAL SIGNS: ED Triage Vitals  Enc Vitals Group     BP 09/24/14 1308 141/63 mmHg     Pulse Rate 09/24/14 1308 86     Resp 09/24/14 1308 16     Temp 09/24/14 1308 97.7 F (36.5 C)     Temp Source 09/24/14 1308 Oral     SpO2 09/24/14 1308 100 %     Weight 09/24/14 1308 152 lb (68.947 kg)     Height 09/24/14 1308 5\' 6"  (1.676 m)     Head Cir --      Peak Flow --      Pain Score --      Pain Loc --      Pain Edu? --      Excl. in [place]? --     Constitutional: Alert and oriented. Patient looks slightly uncomfortable but otherwise is in no acute distress and is interactive. ENT   Head: Normocephalic  and atraumatic.   Nose: No congestion/rhinnorhea.   Mouth/Throat: Mucous membranes are moist. Cardiovascular: Normal rate with rate of 78, regular rhythm, no murmur noted Respiratory:  Normal respiratory effort, no tachypnea.    Breath sounds are clear and equal bilaterally.  Gastrointestinal: Soft and nontender. No distention.  Back: No muscle spasm, no tenderness, no CVA tenderness. Musculoskeletal: No deformity noted. Nontender with normal range of motion in all extremities.  No noted edema. Neurologic:  Normal speech and language. No gross focal neurologic deficits are appreciated.  Skin:  Skin is warm, dry. No rash noted. Psychiatric: Mood and affect are normal. Speech and behavior are normal.  ____________________________________________    LABS (pertinent positives/negatives)  Labs Reviewed  CBC WITH DIFFERENTIAL/PLATELET - Abnormal; Notable for the following:    Hemoglobin 11.5 (*)    HCT 33.2 (*)    All other components within normal limits  COMPREHENSIVE METABOLIC PANEL - Abnormal; Notable for the following:    Sodium 127 (*)    Chloride 95 (*)    Glucose, Bld 109 (*)    Creatinine, Ser 1.02 (*)    ALT 13 (*)    GFR calc non Af Amer 50 (*)    GFR calc Af Amer 58 (*)    All other components within normal limits  URINALYSIS COMPLETEWITH MICROSCOPIC ([facility] ONLY) - Abnormal; Notable for the following:    Color, Urine YELLOW (*)    APPearance CLEAR (*)    Leukocytes, UA TRACE (*)    Squamous Epithelial / LPF 0-5 (*)    All other components within normal limits  TROPONIN I    Urinalysis: White blood cells 6-30, trace leukocyte esterase  ____________________________________________   EKG  ED ECG REPORT I, Teruo Stilley W, the attending physician, personally viewed and interpreted this ECG.   Date: 09/24/2014  EKG Time: 1310  Rate: 81  Rhythm: Normal sinus rhythm with left bundle-branch block  Axis: Normal  Intervals: QRS of 132 with left bundle branch  block  ST&T Change: None noted  ____________________________________________   ____________________________________________   INITIAL IMPRESSION / ASSESSMENT AND PLAN / ED COURSE  Pertinent labs & imaging results that were available during my care of the patient were reviewed by me and considered in my medical decision making (see chart for details).  Pleasant,  alert 81 year old female in no acute distress. She looks slightly uncomfortable but overall well. Her blood tests look okay. She has a slightly low hemoglobin level XI.5 and a slightly low sodium level at 127. Renal function is basically reasonable.  The patient and family believe that this patient's syncope was due to heat. I think this is likely as well.  ----------------------------------------- 2:59 PM on 09/24/2014 -----------------------------------------  Patient is doing well. She continues to be alert communicative. Her vital signs are stable. She is drinking water without acute distress. Discussing the situation further, she reports to other events where she had syncopal episodes since 2014. All these events do not happen to often, and one was associated with a gastrointestinal illness, it may be reasonable to have her follow-up with her regular physician for possible Holter monitoring.  I am placing a call to her doctor, Dr. Ouida Sills, to discuss and to help arrange follow-up.  ____________________________________________   FINAL CLINICAL IMPRESSION(S) / ED DIAGNOSES  Final diagnoses:  Syncope and collapse  Heat causing syncope      Ahmed Prima, MD 09/24/14 1523

## 2014-09-24 NOTE — ED Notes (Addendum)
Pt to ED from church volunteer event when sat down to take a break and passed out. Per pt, pt one ate "half of a bagel and drank a coffee before working outside since 7 am this am." Per husband, pt lost loc "several times for about 20-30 seconds each" Pt is AAOx3 at this time, BP 141/63. Pt denies chest pain or SOB, but c/c of lightheadedness and dizziness along with "black spots in my eyes" Pt NSR at 84, hx hypertension, left bundle branch block, and  breast cancer. Pt given 500 cc bolus via EMS

## 2014-10-21 ENCOUNTER — Other Ambulatory Visit: Payer: Self-pay | Admitting: *Deleted

## 2014-10-21 ENCOUNTER — Telehealth: Payer: Self-pay | Admitting: *Deleted

## 2014-10-21 DIAGNOSIS — C880 Waldenstrom macroglobulinemia: Secondary | ICD-10-CM

## 2014-10-21 NOTE — Telephone Encounter (Signed)
Pt was referred to them for eye pain, they were assessing her for arteritis and had some labs drawn at [facility] Her PMD and her sed rate came back very high at 95. Patietn requested they let Dr Oliva Bustard know this. I discussed with Dr Oliva Bustard and printed off all labs from [facility]. He asked that patient come in for lab only to repeat her SIEP. She has agreed to come in tomorrow for lab. Appt for 915 given

## 2014-10-22 ENCOUNTER — Inpatient Hospital Stay: Payer: Medicare Other | Attending: Oncology

## 2014-10-22 DIAGNOSIS — Z853 Personal history of malignant neoplasm of breast: Secondary | ICD-10-CM | POA: Insufficient documentation

## 2014-10-22 DIAGNOSIS — C880 Waldenstrom macroglobulinemia: Secondary | ICD-10-CM

## 2014-10-24 LAB — PROTEIN ELECTROPHORESIS, SERUM
A/G RATIO SPE: 1.1 (ref 0.7–1.7)
ALPHA-2-GLOBULIN: 0.6 g/dL (ref 0.4–1.0)
Albumin ELP: 3.4 g/dL (ref 2.9–4.4)
Alpha-1-Globulin: 0.2 g/dL (ref 0.0–0.4)
BETA GLOBULIN: 2.1 g/dL — AB (ref 0.7–1.3)
GAMMA GLOBULIN: 0.3 g/dL — AB (ref 0.4–1.8)
Globulin, Total: 3.2 g/dL (ref 2.2–3.9)
M-Spike, %: 0.9 g/dL — ABNORMAL HIGH
TOTAL PROTEIN ELP: 6.6 g/dL (ref 6.0–8.5)

## 2014-11-27 ENCOUNTER — Inpatient Hospital Stay: Payer: Medicare Other | Attending: Oncology | Admitting: Oncology

## 2014-11-27 ENCOUNTER — Inpatient Hospital Stay: Payer: Medicare Other

## 2014-11-27 VITALS — BP 172/91 | HR 88 | Temp 96.7°F | Wt 158.0 lb

## 2014-11-27 DIAGNOSIS — R42 Dizziness and giddiness: Secondary | ICD-10-CM | POA: Insufficient documentation

## 2014-11-27 DIAGNOSIS — C8400 Mycosis fungoides, unspecified site: Secondary | ICD-10-CM | POA: Diagnosis not present

## 2014-11-27 DIAGNOSIS — R531 Weakness: Secondary | ICD-10-CM | POA: Diagnosis not present

## 2014-11-27 DIAGNOSIS — I454 Nonspecific intraventricular block: Secondary | ICD-10-CM | POA: Insufficient documentation

## 2014-11-27 DIAGNOSIS — I10 Essential (primary) hypertension: Secondary | ICD-10-CM | POA: Diagnosis not present

## 2014-11-27 DIAGNOSIS — Z923 Personal history of irradiation: Secondary | ICD-10-CM | POA: Insufficient documentation

## 2014-11-27 DIAGNOSIS — Z801 Family history of malignant neoplasm of trachea, bronchus and lung: Secondary | ICD-10-CM | POA: Diagnosis not present

## 2014-11-27 DIAGNOSIS — I739 Peripheral vascular disease, unspecified: Secondary | ICD-10-CM | POA: Diagnosis not present

## 2014-11-27 DIAGNOSIS — Z9012 Acquired absence of left breast and nipple: Secondary | ICD-10-CM | POA: Diagnosis not present

## 2014-11-27 DIAGNOSIS — H532 Diplopia: Secondary | ICD-10-CM | POA: Diagnosis not present

## 2014-11-27 DIAGNOSIS — H539 Unspecified visual disturbance: Secondary | ICD-10-CM | POA: Diagnosis not present

## 2014-11-27 DIAGNOSIS — Z9221 Personal history of antineoplastic chemotherapy: Secondary | ICD-10-CM | POA: Diagnosis not present

## 2014-11-27 DIAGNOSIS — Z79899 Other long term (current) drug therapy: Secondary | ICD-10-CM | POA: Diagnosis not present

## 2014-11-27 DIAGNOSIS — Z859 Personal history of malignant neoplasm, unspecified: Secondary | ICD-10-CM | POA: Diagnosis not present

## 2014-11-27 DIAGNOSIS — R51 Headache: Secondary | ICD-10-CM | POA: Diagnosis not present

## 2014-11-27 DIAGNOSIS — M549 Dorsalgia, unspecified: Secondary | ICD-10-CM | POA: Diagnosis not present

## 2014-11-27 DIAGNOSIS — C880 Waldenstrom macroglobulinemia: Secondary | ICD-10-CM | POA: Insufficient documentation

## 2014-11-27 DIAGNOSIS — R5383 Other fatigue: Secondary | ICD-10-CM | POA: Insufficient documentation

## 2014-11-27 DIAGNOSIS — R55 Syncope and collapse: Secondary | ICD-10-CM | POA: Diagnosis not present

## 2014-11-27 DIAGNOSIS — E780 Pure hypercholesterolemia: Secondary | ICD-10-CM | POA: Diagnosis not present

## 2014-11-27 DIAGNOSIS — D472 Monoclonal gammopathy: Secondary | ICD-10-CM | POA: Diagnosis present

## 2014-11-27 LAB — CREATININE, SERUM
Creatinine, Ser: 0.71 mg/dL (ref 0.44–1.00)
GFR calc Af Amer: >60 mL/min (ref >60)
GFR calc non Af Amer: >60 mL/min (ref >60)

## 2014-11-27 LAB — HEMOGLOBIN: Hemoglobin: 12 g/dL (ref 12.0–16.0)

## 2014-11-27 NOTE — Progress Notes (Signed)
Patient does not have living will.  Never smoked.  BP elevated today.  172/91.  Patient states she has to sit up in the bed almost every night due to feeling tightness in her chest,

## 2014-11-28 LAB — KAPPA/LAMBDA LIGHT CHAINS
KAPPA FREE LGHT CHN: 8.52 mg/L (ref 3.30–19.40)
Kappa, lambda light chain ratio: 0.26 (ref 0.26–1.65)
LAMDA FREE LIGHT CHAINS: 32.55 mg/L — AB (ref 5.71–26.30)

## 2014-12-03 ENCOUNTER — Ambulatory Visit
Admission: RE | Admit: 2014-12-03 | Discharge: 2014-12-03 | Disposition: A | Discharge status: Home / Self Care | Payer: Medicare Other | Source: Ambulatory Visit | Attending: Oncology | Admitting: Oncology

## 2014-12-03 DIAGNOSIS — C880 Waldenstrom macroglobulinemia: Secondary | ICD-10-CM | POA: Diagnosis not present

## 2014-12-03 DIAGNOSIS — I739 Peripheral vascular disease, unspecified: Secondary | ICD-10-CM | POA: Diagnosis not present

## 2014-12-03 MED ORDER — GADOBENATE DIMEGLUMINE 529 MG/ML IV SOLN
15.0000 mL | Freq: Once | INTRAVENOUS | Status: AC | PRN
  Administered 2014-12-03: 14 mL via INTRAVENOUS

## 2014-12-08 ENCOUNTER — Inpatient Hospital Stay: Payer: Medicare Other

## 2014-12-08 ENCOUNTER — Inpatient Hospital Stay (HOSPITAL_BASED_OUTPATIENT_CLINIC_OR_DEPARTMENT_OTHER): Payer: Medicare Other | Admitting: Oncology

## 2014-12-08 ENCOUNTER — Encounter: Payer: Self-pay | Admitting: Oncology

## 2014-12-08 DIAGNOSIS — I739 Peripheral vascular disease, unspecified: Secondary | ICD-10-CM

## 2014-12-08 DIAGNOSIS — D472 Monoclonal gammopathy: Secondary | ICD-10-CM | POA: Diagnosis not present

## 2014-12-08 DIAGNOSIS — Z9221 Personal history of antineoplastic chemotherapy: Secondary | ICD-10-CM

## 2014-12-08 DIAGNOSIS — R51 Headache: Secondary | ICD-10-CM

## 2014-12-08 DIAGNOSIS — H539 Unspecified visual disturbance: Secondary | ICD-10-CM

## 2014-12-08 DIAGNOSIS — E780 Pure hypercholesterolemia: Secondary | ICD-10-CM

## 2014-12-08 DIAGNOSIS — Z801 Family history of malignant neoplasm of trachea, bronchus and lung: Secondary | ICD-10-CM

## 2014-12-08 DIAGNOSIS — Z859 Personal history of malignant neoplasm, unspecified: Secondary | ICD-10-CM

## 2014-12-08 DIAGNOSIS — C880 Waldenstrom macroglobulinemia: Secondary | ICD-10-CM | POA: Diagnosis not present

## 2014-12-08 DIAGNOSIS — Z923 Personal history of irradiation: Secondary | ICD-10-CM

## 2014-12-08 DIAGNOSIS — R42 Dizziness and giddiness: Secondary | ICD-10-CM

## 2014-12-08 DIAGNOSIS — M549 Dorsalgia, unspecified: Secondary | ICD-10-CM

## 2014-12-08 DIAGNOSIS — C8400 Mycosis fungoides, unspecified site: Secondary | ICD-10-CM | POA: Diagnosis not present

## 2014-12-08 DIAGNOSIS — I454 Nonspecific intraventricular block: Secondary | ICD-10-CM

## 2014-12-08 DIAGNOSIS — H532 Diplopia: Secondary | ICD-10-CM

## 2014-12-08 DIAGNOSIS — I10 Essential (primary) hypertension: Secondary | ICD-10-CM

## 2014-12-08 DIAGNOSIS — R55 Syncope and collapse: Secondary | ICD-10-CM

## 2014-12-08 DIAGNOSIS — R5383 Other fatigue: Secondary | ICD-10-CM

## 2014-12-08 DIAGNOSIS — R531 Weakness: Secondary | ICD-10-CM

## 2014-12-08 DIAGNOSIS — Z9012 Acquired absence of left breast and nipple: Secondary | ICD-10-CM

## 2014-12-08 DIAGNOSIS — Z79899 Other long term (current) drug therapy: Secondary | ICD-10-CM

## 2014-12-08 LAB — TSH: TSH: 2.186 u[IU]/mL (ref 0.350–4.500)

## 2014-12-08 NOTE — Progress Notes (Signed)
[place] [facility] [phone]  Fax:([phone]) [place]: 06/25/1931  MR#: [id number]  BWI#:[id number]  Patient Care Team: Kirk Ruths, MD as PCP - General (Internal Medicine)  CHIEF COMPLAINT:  Chief Complaint  Patient presents with  . Follow-up    Chief Complaint/Diagnosis:   1.  Status post chemoradiation therapy.  Followed by extended adjuvant therapy under NSABP protocol  Unblinded .  Patient was on Aromasin.   2.  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  IgM monoclonal spike.  3.  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 INTERVAL HISTORY: 81 year old lady is seen today because number of complaints that patient started having increasing headache.  Patient had temporal artery biopsy which has been reported to be negative. Patient had a high sedimentation rate Frequent syncopal attack. Eye changes recently documented by ophthalmologist Patient has a previous history of IgM monoclonal gammopathy which was being observed. In March of 2016 Patient had vertebral kyphoplasty for back pain REVIEW OF SYSTEMS:   Gen. status: Patient is somewhat feeling weak and tired. HEENT: Visual disturbances and headache Temporal artery biopsy negative Lungs: No cough no shortness of breath no hemoptysis GI: No nausea no vomiting no diarrhea GU: No dysuria hematuria Musculoskeletal system by the back pain had kyphoplasty done Lower extremity swelling in the night time Skin: No rash Neurological system frequent dizziness.  Was admitted in the hospital with syncopal attack  As per HPI. Otherwise, a complete review of systems is negatve.  PAST MEDICAL HISTORY: Past Medical History  Diagnosis Date  . Hypertension   . Cancer     PAST SURGICAL HISTORY: Past Surgical History  Procedure Laterality Date  . Mastectomy    . Back surgery     Significant History/PMH:   M protien lymphoma:    gall bladder attack: Feb  2009   BBB: Feb 2009 bundle branch block, Feb 2009   Breast cancer:    Hypercholesterolemia:    HTN:    Oncology Protocol: NSABP B-33   Oncology Protocol: NSABP B-33    Pt is on Exemestane (Aromasin)   R cataract removal 03/2013:    Mastectomy: LEFT  Preventive Screening:  Has patient had any of the following test? Mammography (1)   Last Mammography: 03/2010(1)   PFSH: Comments: No family history of colorectal cancer, breast cancer, or ovarian cancer.  Social History: noncontributory, negative alcohol, negative tobacco  Additional Past Medical and Surgical History: is been reviewed from multiple previous   HEALTH MAINTENANCE: Social History  Substance Use Topics  . Smoking status: Never Smoker   . Smokeless tobacco: None  . Alcohol Use: No      Allergies  Allergen Reactions  . Ultram [Tramadol] Other (See Comments)    Pt states that it causes her to faint.     Current Outpatient Prescriptions  Medication Sig Dispense Refill  . acetaminophen (TYLENOL) 500 MG tablet Take 1,000 mg by mouth every 6 (six) hours as needed for mild pain or headache.    . carvedilol (COREG) 3.125 MG tablet Take 3.125 mg by mouth 2 (two) times daily.    . celecoxib (CELEBREX) 200 MG capsule Take 200 mg by mouth daily.    . cetirizine (ZYRTEC) 10 MG tablet Take 10 mg by mouth daily.    . cholecalciferol (VITAMIN D) 1000 UNITS tablet Take 1,000 Units by mouth daily.    . fluticasone (FLONASE) 50 MCG/ACT nasal spray Place 2 sprays into both  nostrils 2 (two) times daily as needed for rhinitis.    Marland Kitchen losartan-hydrochlorothiazide (HYZAAR) 100-12.5 MG per tablet Take 1 tablet by mouth daily.    Marland Kitchen omeprazole (PRILOSEC) 20 MG capsule Take 20 mg by mouth daily.    . sennosides-docusate sodium (SENOKOT-S) 8.6-50 MG tablet Take 3 tablets by mouth at bedtime.    . simvastatin (ZOCOR) 40 MG tablet Take 40 mg by mouth at bedtime.     No current facility-administered medications for this visit.     OBJECTIVE:  Filed Vitals:   11/27/14 1007  BP: 172/91  Pulse: 88  Temp: 96.7 F (35.9 C)     Body mass index is 25.51 kg/(m^2).    ECOG FS:1 - Symptomatic but completely ambulatory  PHYSICAL EXAM: General  status: Performance status is good.  Patient has not lost significant weight HEENT: No evidence of stomatitis. Sclera and conjunctivae :: No jaundice.   pale looking. Lungs: Air  entry equal on both sides.  No rhonchi.  No rales.  Cardiac: Heart sounds are normal.  No pericardial rub.  No murmur. Lymphatic system: Cervical, axillary, inguinal, lymph nodes not palpable GI: Abdomen is soft.  No ascites.  Liver spleen not palpable.  No tenderness.  Bowel sounds are within normal limit Lower extremity: No edema Neurological system: Higher functions, cranial nerves intact no evidence of peripheral neuropathy. Skin: No rash.  No ecchymosis.. Examination of both breasts: Left breast mastectomy no evidence of recurrent disease Right breast free of masses   LAB RESULTS:  CBC Latest Ref Rng 11/27/2014 09/24/2014  WBC 3.6 - 11.0 K/uL - 9.1  Hemoglobin 12.0 - 16.0 g/dL 12.0 11.5(L)  Hematocrit 35.0 - 47.0 % - 33.2(L)  Platelets 150 - 440 K/uL - 316    Appointment on 11/27/2014  Component Date Value Ref Range Status  . Hemoglobin 11/27/2014 12.0  12.0 - 16.0 g/dL Final  . Creatinine, Ser 11/27/2014 0.71  0.44 - 1.00 mg/dL Final  . GFR calc non Af Amer 11/27/2014 >60  >60 mL/min Final  . GFR calc Af Amer 11/27/2014 >60  >60 mL/min Final   Comment: (NOTE) The eGFR has been calculated using the CKD EPI equation. This calculation has not been validated in all clinical situations. eGFR's persistently <60 mL/min signify possible Chronic Kidney Disease.   . Kappa free light chain 11/27/2014 8.52  3.30 - 19.40 mg/L Final  . Lamda free light chains 11/27/2014 32.55* 5.71 - 26.30 mg/L Final  . Kappa, lamda light chain ratio 11/27/2014 0.26  0.26 - 1.65 Final   Comment: (NOTE) Performed  At: [facility] [place], [place] [id number] Lindon Romp MD BX:[id number]        STUDIES: Mr Jeri Cos Wo Contrast  Dec 25, 2014   CLINICAL DATA:  Sharp pain in the LEFT eye with double vision for 3 days. Syncopal episodes in the past year. Dizziness.  EXAM: MRI HEAD WITHOUT AND WITH CONTRAST  TECHNIQUE: Multiplanar, multiecho pulse sequences of the brain and surrounding structures were obtained without and with intravenous contrast.  CONTRAST:  4m MULTIHANCE GADOBENATE DIMEGLUMINE 529 MG/ML IV SOLN  COMPARISON:  CT head 02/02/2014.  FINDINGS: No evidence for acute infarction, hemorrhage, mass lesion, hydrocephalus, or extra-axial fluid. Normal for age cerebral volume. Mild to moderate T2 and FLAIR hyperintensities throughout the periventricular and subcortical white matter, likely small vessel disease. This is Flow voids are maintained throughout the carotid, basilar, and vertebral arteries. There are no areas of chronic hemorrhage. Pituitary, pineal, and  cerebellar tonsils unremarkable. No upper cervical lesions.  Post infusion, no abnormal enhancement of the brain or meninges. BILATERAL cataract extraction. No acute sinus or mastoid disease. Scalp soft tissues unremarkable.  IMPRESSION: Age-related atrophy with mild to moderate small vessel disease. No acute intracranial findings.  Unremarkable appearing orbits without visible abnormality in the anterior optic pathways on this routine brain MR.   Electronically Signed   By: Staci Righter M.D.   On: 12/03/2014 15:52    ASSESSMENT: Dizziness and syncopal attack in a patient was up monoclonal gammopathy with IgM. MRI scan would be recommended. Reevaluate patient after MRI scan is available.  MEDICAL DECISION MAKING:  Visual changes headache syncopal attack is bothersome and may be related with monoclonal gammopathy with M spike.  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 Last bone  marrow test was 4  years ago If needed bone marrow test will be repeated.  Patient expressed understanding and was in agreement with this plan. She also understands that She can call clinic at any time with any questions, concerns, or complaints.    No matching staging information was found for the patient.  Forest Gleason, MD   12/08/2014 4:51 PM

## 2014-12-08 NOTE — Progress Notes (Signed)
[place] @ [facility] [phone]  Fax:(336) [place] OB: 10-22-1931  MR#: [id number]  BMW#:[id number]  Patient Care Team: Kirk Ruths, MD as PCP - General (Internal Medicine)  CHIEF COMPLAINT:  Chief Complaint  Patient presents with  . Back Pain  . Results    Chief Complaint/Diagnosis:   1.  Status post chemoradiation therapy.  Followed by extended adjuvant therapy under NSABP protocol  Unblinded .  Patient was on Aromasin.   2.  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  IgM monoclonal spike.  3.  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 INTERVAL HISTORY: 81 year old lady is seen today because number of complaints that patient started having increasing headache.  Patient had temporal artery biopsy which has been reported to be negative. Patient had a high sedimentation rate Frequent syncopal attack. Eye changes recently documented by ophthalmologist Patient has a previous history of IgM monoclonal gammopathy which was being observed. In March of 2016 Patient had vertebral kyphoplasty for back pain December 08, 2014 Patient continues to have symptoms of dizziness.  Calcium level was 11.0.  MRI scan of brain was negative.  Patient is here for further follow-up and treatment consideration REVIEW OF SYSTEMS:   Gen. status: Patient is somewhat feeling weak and tired. HEENT: Visual disturbances and headache Temporal artery biopsy negative Lungs: No cough no shortness of breath no hemoptysis GI: No nausea no vomiting no diarrhea GU: No dysuria hematuria Musculoskeletal system by the back pain had kyphoplasty done Lower extremity swelling in the night time Skin: No rash Neurological system frequent dizziness.  Was admitted in the hospital with syncopal attack  As per HPI. Otherwise, a complete review of systems is negatve.  PAST MEDICAL HISTORY: Past Medical History  Diagnosis Date  . Hypertension   .  Cancer     PAST SURGICAL HISTORY: Past Surgical History  Procedure Laterality Date  . Mastectomy    . Back surgery     Significant History/PMH:   M protien lymphoma:    gall bladder attack: Feb 2009   BBB: Feb 2009 bundle branch block, Feb 2009   Breast cancer:    Hypercholesterolemia:    HTN:    Oncology Protocol: NSABP B-33   Oncology Protocol: NSABP B-33    Pt is on Exemestane (Aromasin)   R cataract removal 03/2013:    Mastectomy: LEFT  Preventive Screening:  Has patient had any of the following test? Mammography (1)   Last Mammography: 03/2010(1)   PFSH: Comments: No family history of colorectal cancer, breast cancer, or ovarian cancer.  Social History: noncontributory, negative alcohol, negative tobacco  Additional Past Medical and Surgical History: is been reviewed from multiple previous   HEALTH MAINTENANCE: Social History  Substance Use Topics  . Smoking status: Never Smoker   . Smokeless tobacco: None  . Alcohol Use: No      Allergies  Allergen Reactions  . Ultram [Tramadol] Other (See Comments)    Pt states that it causes her to faint.     Current Outpatient Prescriptions  Medication Sig Dispense Refill  . acetaminophen (TYLENOL) 500 MG tablet Take 1,000 mg by mouth every 6 (six) hours as needed for mild pain or headache.    . carvedilol (COREG) 3.125 MG tablet Take 3.125 mg by mouth 2 (two) times daily.    . celecoxib (CELEBREX) 200 MG capsule Take 200 mg by mouth daily.    . cetirizine (ZYRTEC) 10 MG tablet Take  10 mg by mouth daily.    . cholecalciferol (VITAMIN D) 1000 UNITS tablet Take 1,000 Units by mouth daily.    . fluticasone (FLONASE) 50 MCG/ACT nasal spray Place 2 sprays into both nostrils 2 (two) times daily as needed for rhinitis.    Marland Kitchen losartan-hydrochlorothiazide (HYZAAR) 100-12.5 MG per tablet Take 1 tablet by mouth daily.    Marland Kitchen omeprazole (PRILOSEC) 20 MG capsule Take 20 mg by mouth daily.    . sennosides-docusate sodium  (SENOKOT-S) 8.6-50 MG tablet Take 3 tablets by mouth at bedtime.    . simvastatin (ZOCOR) 40 MG tablet Take 40 mg by mouth at bedtime.     No current facility-administered medications for this visit.    OBJECTIVE:  Filed Vitals:   12/08/14 1516  BP: 141/76  Pulse: 81  Temp: 96.7 F (35.9 C)  Resp: 18     Body mass index is 25.28 kg/(m^2).    ECOG FS:1 - Symptomatic but completely ambulatory  PHYSICAL EXAM: General  status: Performance status is good.  Patient has not lost significant weight HEENT: No evidence of stomatitis. Sclera and conjunctivae :: No jaundice.   pale looking. Lungs: Air  entry equal on both sides.  No rhonchi.  No rales.  Cardiac: Heart sounds are normal.  No pericardial rub.  No murmur. Lymphatic system: Cervical, axillary, inguinal, lymph nodes not palpable GI: Abdomen is soft.  No ascites.  Liver spleen not palpable.  No tenderness.  Bowel sounds are within normal limit Lower extremity: No edema Neurological system: Higher functions, cranial nerves intact no evidence of peripheral neuropathy. Skin: No rash.  No ecchymosis.. Examination of both breasts: Left breast mastectomy no evidence of recurrent disease Right breast free of masses   LAB RESULTS:  CBC Latest Ref Rng 11/27/2014 09/24/2014  WBC 3.6 - 11.0 K/uL - 9.1  Hemoglobin 12.0 - 16.0 g/dL 12.0 11.5(L)  Hematocrit 35.0 - 47.0 % - 33.2(L)  Platelets 150 - 440 K/uL - 316    No visits with results within 5 Day(s) from this visit. Latest known visit with results is:  Appointment on 11/27/2014  Component Date Value Ref Range Status  . Hemoglobin 11/27/2014 12.0  12.0 - 16.0 g/dL Final  . Creatinine, Ser 11/27/2014 0.71  0.44 - 1.00 mg/dL Final  . GFR calc non Af Amer 11/27/2014 >60  >60 mL/min Final  . GFR calc Af Amer 11/27/2014 >60  >60 mL/min Final   Comment: (NOTE) The eGFR has been calculated using the CKD EPI equation. This calculation has not been validated in all clinical  situations. eGFR's persistently <60 mL/min signify possible Chronic Kidney Disease.   . Kappa free light chain 11/27/2014 8.52  3.30 - 19.40 mg/L Final  . Lamda free light chains 11/27/2014 32.55* 5.71 - 26.30 mg/L Final  . Kappa, lamda light chain ratio 11/27/2014 0.26  0.26 - 1.65 Final   Comment: (NOTE) Performed At: [facility] [place], [place] [id number] Lindon Romp MD VZ:[id number]        STUDIES: Mr Jeri Cos Wo Contrast  12/20/2014   CLINICAL DATA:  Sharp pain in the LEFT eye with double vision for 3 days. Syncopal episodes in the past year. Dizziness.  EXAM: MRI HEAD WITHOUT AND WITH CONTRAST  TECHNIQUE: Multiplanar, multiecho pulse sequences of the brain and surrounding structures were obtained without and with intravenous contrast.  CONTRAST:  68m MULTIHANCE GADOBENATE DIMEGLUMINE 529 MG/ML IV SOLN  COMPARISON:  CT head 02/02/2014.  FINDINGS: No evidence  for acute infarction, hemorrhage, mass lesion, hydrocephalus, or extra-axial fluid. Normal for age cerebral volume. Mild to moderate T2 and FLAIR hyperintensities throughout the periventricular and subcortical white matter, likely small vessel disease. This is Flow voids are maintained throughout the carotid, basilar, and vertebral arteries. There are no areas of chronic hemorrhage. Pituitary, pineal, and cerebellar tonsils unremarkable. No upper cervical lesions.  Post infusion, no abnormal enhancement of the brain or meninges. BILATERAL cataract extraction. No acute sinus or mastoid disease. Scalp soft tissues unremarkable.  IMPRESSION: Age-related atrophy with mild to moderate small vessel disease. No acute intracranial findings.  Unremarkable appearing orbits without visible abnormality in the anterior optic pathways on this routine brain MR.   Electronically Signed   By: Staci Righter M.D.   On: 12/03/2014 15:52    ASSESSMENT: Dizziness and syncopal attack in a patient was up monoclonal gammopathy  with IgM. MRI scan would be recommended. Reevaluate patient after MRI scan is available. 2.  Patient had hypercalcemia. Check parathyroid hormone level   consider Zometa therapy to bring calcium down and see whether some of the symptoms  are Patient would improve.   MEDICAL DECISION MAKING:  Visual changes headache syncopal attack is bothersome and may be related with monoclonal gammopathy with M spike.  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 Last bone marrow test was 4  years ago If needed bone marrow test will be repeated..   2.  Hypercalcemia may be related to Waldenstrm's macroglobulinemia   We will check blood for viscosity If it is high then patient would be considered for treatment option for 1-total macroglobulinemia Discussed that with the patient and family Will try to get approval for Zometa therapy    Patient expressed understanding and was in agreement with this plan. She also understands that She can call clinic at any time with any questions, concerns, or complaints.    No matching staging information was found for the patient.  Forest Gleason, MD   12/08/2014 5:02 PM

## 2014-12-11 ENCOUNTER — Inpatient Hospital Stay: Payer: Medicare Other | Admitting: *Deleted

## 2014-12-11 ENCOUNTER — Inpatient Hospital Stay: Payer: Medicare Other

## 2014-12-11 VITALS — BP 123/76 | HR 80 | Temp 97.4°F | Resp 20

## 2014-12-11 DIAGNOSIS — C880 Waldenstrom macroglobulinemia: Secondary | ICD-10-CM

## 2014-12-11 DIAGNOSIS — D472 Monoclonal gammopathy: Secondary | ICD-10-CM | POA: Diagnosis not present

## 2014-12-11 MED ORDER — ZOLEDRONIC ACID 4 MG/5ML IV CONC
3.3000 mg | Freq: Once | INTRAVENOUS | Status: AC
Start: 2014-12-11 — End: 2014-12-11
  Administered 2014-12-11: 3.3 mg via INTRAVENOUS
  Filled 2014-12-11: qty 4.13

## 2014-12-11 MED ORDER — SODIUM CHLORIDE 0.9 % IV SOLN
Freq: Once | INTRAVENOUS | Status: AC
  Administered 2014-12-11: 16:00:00 via INTRAVENOUS
  Filled 2014-12-11: qty 1000

## 2014-12-12 ENCOUNTER — Other Ambulatory Visit: Payer: Self-pay | Admitting: *Deleted

## 2014-12-12 DIAGNOSIS — C880 Waldenstrom macroglobulinemia: Secondary | ICD-10-CM

## 2014-12-14 LAB — PTH-RELATED PEPTIDE

## 2014-12-15 ENCOUNTER — Encounter: Payer: Self-pay | Admitting: Oncology

## 2014-12-15 ENCOUNTER — Inpatient Hospital Stay: Payer: Medicare Other

## 2014-12-15 ENCOUNTER — Inpatient Hospital Stay (HOSPITAL_BASED_OUTPATIENT_CLINIC_OR_DEPARTMENT_OTHER): Payer: Medicare Other | Admitting: Oncology

## 2014-12-15 VITALS — BP 145/78 | HR 80 | Temp 95.6°F | Wt 157.1 lb

## 2014-12-15 DIAGNOSIS — C880 Waldenstrom macroglobulinemia: Secondary | ICD-10-CM

## 2014-12-15 DIAGNOSIS — R51 Headache: Secondary | ICD-10-CM

## 2014-12-15 DIAGNOSIS — H532 Diplopia: Secondary | ICD-10-CM

## 2014-12-15 DIAGNOSIS — R42 Dizziness and giddiness: Secondary | ICD-10-CM

## 2014-12-15 DIAGNOSIS — H539 Unspecified visual disturbance: Secondary | ICD-10-CM

## 2014-12-15 DIAGNOSIS — C8400 Mycosis fungoides, unspecified site: Secondary | ICD-10-CM | POA: Diagnosis not present

## 2014-12-15 DIAGNOSIS — I10 Essential (primary) hypertension: Secondary | ICD-10-CM

## 2014-12-15 DIAGNOSIS — Z9012 Acquired absence of left breast and nipple: Secondary | ICD-10-CM

## 2014-12-15 DIAGNOSIS — D472 Monoclonal gammopathy: Secondary | ICD-10-CM | POA: Diagnosis not present

## 2014-12-15 DIAGNOSIS — Z923 Personal history of irradiation: Secondary | ICD-10-CM

## 2014-12-15 DIAGNOSIS — Z859 Personal history of malignant neoplasm, unspecified: Secondary | ICD-10-CM

## 2014-12-15 DIAGNOSIS — Z801 Family history of malignant neoplasm of trachea, bronchus and lung: Secondary | ICD-10-CM

## 2014-12-15 DIAGNOSIS — I454 Nonspecific intraventricular block: Secondary | ICD-10-CM

## 2014-12-15 DIAGNOSIS — E780 Pure hypercholesterolemia: Secondary | ICD-10-CM

## 2014-12-15 DIAGNOSIS — Z79899 Other long term (current) drug therapy: Secondary | ICD-10-CM

## 2014-12-15 DIAGNOSIS — R5383 Other fatigue: Secondary | ICD-10-CM

## 2014-12-15 DIAGNOSIS — I739 Peripheral vascular disease, unspecified: Secondary | ICD-10-CM

## 2014-12-15 DIAGNOSIS — Z9221 Personal history of antineoplastic chemotherapy: Secondary | ICD-10-CM

## 2014-12-15 DIAGNOSIS — M549 Dorsalgia, unspecified: Secondary | ICD-10-CM

## 2014-12-15 DIAGNOSIS — R55 Syncope and collapse: Secondary | ICD-10-CM

## 2014-12-15 DIAGNOSIS — R531 Weakness: Secondary | ICD-10-CM

## 2014-12-15 LAB — BASIC METABOLIC PANEL
Anion gap: 5 (ref 5–15)
BUN: 24 mg/dL — ABNORMAL HIGH (ref 6–20)
CALCIUM: 8.2 mg/dL — AB (ref 8.9–10.3)
CO2: 26 mmol/L (ref 22–32)
Chloride: 99 mmol/L — ABNORMAL LOW (ref 101–111)
Creatinine, Ser: 1.11 mg/dL — ABNORMAL HIGH (ref 0.44–1.00)
GFR calc Af Amer: 53 mL/min — ABNORMAL LOW (ref >60)
GFR, EST NON AFRICAN AMERICAN: 45 mL/min — AB (ref >60)
GLUCOSE: 126 mg/dL — AB (ref 65–99)
Potassium: 3.9 mmol/L (ref 3.5–5.1)
Sodium: 130 mmol/L — ABNORMAL LOW (ref 135–145)

## 2014-12-15 MED ORDER — PREDNISONE 10 MG PO TABS: ORAL_TABLET | ORAL | Status: DC

## 2014-12-15 MED ORDER — HYDROCODONE-ACETAMINOPHEN 5-325 MG PO TABS
0.5000 | ORAL_TABLET | Freq: Four times a day (QID) | ORAL | Status: DC | PRN
Start: 2014-12-15 — End: 2015-08-31

## 2014-12-15 NOTE — Progress Notes (Signed)
Patient does not have living will.  Never smoked.  Patient complains of stiff neck and pain 8/10.  Uses heating pad for pain.

## 2014-12-16 LAB — T4: T4, Total: 8 ug/dL (ref 4.5–12.0)

## 2014-12-16 LAB — PTH, INTACT AND CALCIUM
Calcium, Total (PTH): 10.2 mg/dL (ref 8.7–10.3)
PTH: 22 pg/mL (ref 15–65)

## 2014-12-16 LAB — VISCOSITY, SERUM: Viscosity, Serum: 1.6 rel.saline (ref 1.6–1.9)

## 2014-12-21 NOTE — Progress Notes (Signed)
[place] @ [facility] [phone]  Fax:(336) [place] OB: Aug 16, 1931  MR#: [id number]  EQA#:[id number]  Patient Care Team: Kirk Ruths, MD as PCP - General (Internal Medicine)  CHIEF COMPLAINT:  Chief Complaint  Patient presents with  . OTHER    Chief Complaint/Diagnosis:   1.  Status post chemoradiation therapy.  Followed by extended adjuvant therapy under NSABP protocol  Unblinded .  Patient was on Aromasin.   2.  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  IgM monoclonal spike.  3.  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 INTERVAL HISTORY: 81 year old lady is seen today because number of complaints that patient started having increasing headache.  Patient had temporal artery biopsy which has been reported to be negative. Patient had a high sedimentation rate Frequent syncopal attack. Eye changes recently documented by ophthalmologist   Patient is here for ongoing evaluation.  Patient's serum viscosity is within acceptable range.  Does not have any significant IgM.  Level.  Number of neurological symptoms which may or may not have been related to high IgM level REVIEW OF SYSTEMS:   Gen. status: Patient is somewhat feeling weak and tired. HEENT: Visual disturbances and headache Temporal artery biopsy negative Lungs: No cough no shortness of breath no hemoptysis GI: No nausea no vomiting no diarrhea GU: No dysuria hematuria Musculoskeletal system by the back pain had kyphoplasty done Lower extremity swelling in the night time Skin: No rash Neurological system frequent dizziness.  Was admitted in the hospital with syncopal attack  As per HPI. Otherwise, a complete review of systems is negative.  PAST MEDICAL HISTORY: Past Medical History  Diagnosis Date  . Hypertension   . Cancer     PAST SURGICAL HISTORY: Past Surgical History  Procedure Laterality Date  . Mastectomy    . Back surgery      Significant History/PMH:   M protein lymphoma:    gall bladder attack: Feb 2009   BBB: Feb 2009 bundle branch block, Feb 2009   Breast cancer:    Hypercholesterolemia:    HTN:    Oncology Protocol: NSABP B-33   Oncology Protocol: NSABP B-33    Pt is on Exemestane (Aromasin)   R cataract removal 03/2013:    Mastectomy: LEFT  Preventive Screening:  Has patient had any of the following test? Mammography (1)   Last Mammography: 03/2010(1)   PF SH: Comments: No family history of colorectal cancer, breast cancer, or ovarian cancer.  Social History: noncontributory, negative alcohol, negative tobacco  Additional Past Medical and Surgical History: is been reviewed from multiple previous   HEALTH MAINTENANCE: Social History  Substance Use Topics  . Smoking status: Never Smoker   . Smokeless tobacco: None  . Alcohol Use: No      Allergies  Allergen Reactions  . Ultram [Tramadol] Other (See Comments)    Pt states that it causes her to faint.     Current Outpatient Prescriptions  Medication Sig Dispense Refill  . acetaminophen (TYLENOL) 500 MG tablet Take 1,000 mg by mouth every 6 (six) hours as needed for mild pain or headache.    . carvedilol (COREG) 3.125 MG tablet Take 3.125 mg by mouth 2 (two) times daily.    . celecoxib (CELEBREX) 200 MG capsule Take 200 mg by mouth daily.    . cetirizine (ZYRTEC) 10 MG tablet Take 10 mg by mouth daily.    . cholecalciferol (VITAMIN D) 1000 UNITS tablet Take 1,000  Units by mouth daily.    . fluticasone (FLONASE) 50 MCG/ACT nasal spray Place 2 sprays into both nostrils 2 (two) times daily as needed for rhinitis.    Marland Kitchen losartan-hydrochlorothiazide (HYZAAR) 100-12.5 MG per tablet Take 1 tablet by mouth daily.    Marland Kitchen omeprazole (PRILOSEC) 20 MG capsule Take 20 mg by mouth daily.    . sennosides-docusate sodium (SENOKOT-S) 8.6-50 MG tablet Take 3 tablets by mouth at bedtime.    . simvastatin (ZOCOR) 40 MG tablet Take 40 mg by mouth at  bedtime.    Marland Kitchen HYDROcodone-acetaminophen (NORCO/VICODIN) 5-325 MG per tablet Take 0.5 tablets by mouth every 6 (six) hours as needed for moderate pain. 30 tablet 0  . predniSONE (DELTASONE) 10 MG tablet 6 tabs po x 1 day, 5 tabs po x 1 day, 4 tabs po x 1 day, 3 tabs po x 1 day, 2 tabs po x 1 day, 1 tab po x 1 day, then stop 21 tablet 0   No current facility-administered medications for this visit.    OBJECTIVE:  Filed Vitals:   12/15/14 1525  BP: 145/78  Pulse: 80  Temp: 95.6 F (35.3 C)     Body mass index is 25.37 kg/(m^2).    EOG FBS:1 - Symptomatic but completely ambulatory  PHYSICAL EXAM: General  status: Performance status is good.  Patient has not lost significant weight HE ENT: No evidence of stomatitis. Sclera and conjunctivae :: No jaundice.   pale looking. Lungs: Air  entry equal on both sides.  No rhonchi.  No rales.  Cardiac: Heart sounds are normal.  No pericardial rub.  No murmur. Lymphatic system: Cervical, axillary, inguinal, lymph nodes not palpable GI: Abdomen is soft.  No ascites.  Liver spleen not palpable.  No tenderness.  Bowel sounds are within normal limit Lower extremity: No edema Neurological system: Higher functions, cranial nerves intact no evidence of peripheral neuropathy. Skin: No rash.  No ecchymosis.. Examination of both breasts: Left breast mastectomy no evidence of recurrent disease Right breast free of masses   LAB RESULTS:  CBC Latest Ref Rng 11/27/2014 09/24/2014  WBC 3.6 - 11.0 K/uL - 9.1  Hemoglobin 12.0 - 16.0 g/dL 12.0 11.5(L)  Hematocrit 35.0 - 47.0 % - 33.2(L)  Platelets 150 - 440 K/uL - 316    Appointment on 12/15/2014  Component Date Value Ref Range Status  . Sodium 12/15/2014 130* 135 - 145 mmol/L Final  . Potassium 12/15/2014 3.9  3.5 - 5.1 mmol/L Final  . Chloride 12/15/2014 99* 101 - 111 mmol/L Final  . CO2 12/15/2014 26  22 - 32 mmol/L Final  . Glucose, Bld 12/15/2014 126* 65 - 99 mg/dL Final  . BUN 12/15/2014 24* 6 - 20  mg/dL Final  . Creatinine, Ser 12/15/2014 1.11* 0.44 - 1.00 mg/dL Final  . Calcium 12/15/2014 8.2* 8.9 - 10.3 mg/dL Final  . GFR calc non Af Amer 12/15/2014 45* >60 mL/min Final  . GFR calc Af Amer 12/15/2014 53* >60 mL/min Final   Comment: (NOTE) The eGFR has been calculated using the CKD EPI equation. This calculation has not been validated in all clinical situations. eGFR's persistently <60 mL/min signify possible Chronic Kidney Disease.   . Anion gap 12/15/2014 5  5 - 15 Final       STUDIES: Mr Kizzie Fantasia Contrast  12-16-2014   CLINICAL DATA:  Sharp pain in the LEFT eye with double vision for 3 days. Syncopal episodes in the past year. Dizziness.  EXAM: MRI  HEAD WITHOUT AND WITH CONTRAST  TECHNIQUE: Multiplanar, multiecho pulse sequences of the brain and surrounding structures were obtained without and with intravenous contrast.  CONTRAST:  2m MULTIHANCE GADOBENATE DIMEGLUMINE 529 MG/ML IV SOLN  COMPARISON:  CT head 02/02/2014.  FINDINGS: No evidence for acute infarction, hemorrhage, mass lesion, hydrocephalus, or extra-axial fluid. Normal for age cerebral volume. Mild to moderate T2 and FLAIR hyperintensities throughout the periventricular and subcortical white matter, likely small vessel disease. This is Flow voids are maintained throughout the carotid, basilar, and vertebral arteries. There are no areas of chronic hemorrhage. Pituitary, pineal, and cerebellar tonsils unremarkable. No upper cervical lesions.  Post infusion, no abnormal enhancement of the brain or meninges. BILATERAL cataract extraction. No acute sinus or mastoid disease. Scalp soft tissues unremarkable.  IMPRESSION: Age-related atrophy with mild to moderate small vessel disease. No acute intracranial findings.  Unremarkable appearing orbits without visible abnormality in the anterior optic pathways on this routine brain MR.   Electronically Signed   By: JStaci RighterM.D.   On: 12/03/2014 15:52    ASSESSMENT: Dizziness  and syncopal attack in a patient was up monoclonal gammopathy with IgM. MRI scan has been reviewed does not show any evidence of abnormality . 2.  Patient had hypercalcemia. Hypercalcemia has resolved. PTH level is within acceptable range,however non-parathyroid hypercalcemia cannot be ruled out and calcium level would be monitored carefully As is not clear whether lower level of IgM monoclonal spike is responsible for some of the symptoms we would like to start patient on prednisone and observe whether there is any improvement in the symptoms Possibility of rituximab can be considered however I doubt there is any clear cut indication at present time to treat patient with rituximab but careful consideration can be given the patient continues taking neurological symptoms and no other etiologies were found MEDICAL DECISION MAKING:  Waldenstrm's macroglobulinemia Serum     [place] t is within normal limit High sedimentation rate and neurological symptoms may or may not related to macroglobulinemia  Patient expressed understanding and was in agreement with this plan. She also understands that She can call clinic at any time with any questions, concerns, or complaints.    No matching staging information was found for the patient.  JForest Gleason MD   12/21/2014 8:32 PM

## 2014-12-22 ENCOUNTER — Other Ambulatory Visit: Payer: Self-pay | Admitting: *Deleted

## 2014-12-22 DIAGNOSIS — C880 Waldenstrom macroglobulinemia: Secondary | ICD-10-CM

## 2014-12-23 ENCOUNTER — Telehealth: Payer: Self-pay | Admitting: *Deleted

## 2014-12-23 MED ORDER — GABAPENTIN 100 MG PO CAPS: 200.0000 mg | ORAL_CAPSULE | Freq: Three times a day (TID) | ORAL | Status: DC

## 2014-12-23 NOTE — Telephone Encounter (Signed)
Neurontin 200 mg tid called in to [facility], patient informed and asked if she will be able to drive. I advised her that it may make her drowsy and to see how it affects her before she drives whilst taking it. Confirmed appt for 10/26

## 2014-12-23 NOTE — Telephone Encounter (Addendum)
Left foot throbbing and burning, can't sleep at night. Has to get up and walk only sleeping 3 hours a night past 5 nights. Mostly in toes like match lit at feet. Occasionally has twinge in right foot. Hydrocodone full tablet did not phase the pain. She is also asking about flu shot, said she read you cannot get it if you are on Rituxan

## 2015-01-14 ENCOUNTER — Encounter: Payer: Self-pay | Admitting: Oncology

## 2015-01-14 ENCOUNTER — Inpatient Hospital Stay: Payer: Medicare Other

## 2015-01-14 ENCOUNTER — Inpatient Hospital Stay: Payer: Medicare Other | Attending: Oncology | Admitting: Oncology

## 2015-01-14 VITALS — BP 136/68 | HR 80 | Temp 96.1°F | Wt 160.0 lb

## 2015-01-14 DIAGNOSIS — Z9221 Personal history of antineoplastic chemotherapy: Secondary | ICD-10-CM

## 2015-01-14 DIAGNOSIS — R51 Headache: Secondary | ICD-10-CM | POA: Diagnosis not present

## 2015-01-14 DIAGNOSIS — R600 Localized edema: Secondary | ICD-10-CM

## 2015-01-14 DIAGNOSIS — I10 Essential (primary) hypertension: Secondary | ICD-10-CM | POA: Insufficient documentation

## 2015-01-14 DIAGNOSIS — C880 Waldenstrom macroglobulinemia: Secondary | ICD-10-CM | POA: Diagnosis present

## 2015-01-14 DIAGNOSIS — Z923 Personal history of irradiation: Secondary | ICD-10-CM | POA: Diagnosis not present

## 2015-01-14 DIAGNOSIS — C8400 Mycosis fungoides, unspecified site: Secondary | ICD-10-CM | POA: Insufficient documentation

## 2015-01-14 DIAGNOSIS — I454 Nonspecific intraventricular block: Secondary | ICD-10-CM | POA: Diagnosis not present

## 2015-01-14 DIAGNOSIS — D472 Monoclonal gammopathy: Secondary | ICD-10-CM | POA: Diagnosis not present

## 2015-01-14 DIAGNOSIS — R1900 Intra-abdominal and pelvic swelling, mass and lump, unspecified site: Secondary | ICD-10-CM

## 2015-01-14 DIAGNOSIS — Z853 Personal history of malignant neoplasm of breast: Secondary | ICD-10-CM

## 2015-01-14 DIAGNOSIS — Z79899 Other long term (current) drug therapy: Secondary | ICD-10-CM | POA: Diagnosis not present

## 2015-01-14 DIAGNOSIS — Z8572 Personal history of non-Hodgkin lymphomas: Secondary | ICD-10-CM | POA: Insufficient documentation

## 2015-01-14 DIAGNOSIS — R42 Dizziness and giddiness: Secondary | ICD-10-CM | POA: Insufficient documentation

## 2015-01-14 DIAGNOSIS — E7800 Pure hypercholesterolemia, unspecified: Secondary | ICD-10-CM | POA: Insufficient documentation

## 2015-01-14 DIAGNOSIS — Z9012 Acquired absence of left breast and nipple: Secondary | ICD-10-CM | POA: Diagnosis not present

## 2015-01-14 DIAGNOSIS — C8590 Non-Hodgkin lymphoma, unspecified, unspecified site: Secondary | ICD-10-CM

## 2015-01-14 LAB — COMPREHENSIVE METABOLIC PANEL
ALBUMIN: 3.5 g/dL (ref 3.5–5.0)
ALK PHOS: 66 U/L (ref 38–126)
ALT: 13 U/L — ABNORMAL LOW (ref 14–54)
ANION GAP: 6 (ref 5–15)
AST: 21 U/L (ref 15–41)
BUN: 16 mg/dL (ref 6–20)
CHLORIDE: 97 mmol/L — AB (ref 101–111)
CO2: 26 mmol/L (ref 22–32)
Calcium: 8.6 mg/dL — ABNORMAL LOW (ref 8.9–10.3)
Creatinine, Ser: 0.91 mg/dL (ref 0.44–1.00)
GFR calc non Af Amer: 58 mL/min — ABNORMAL LOW (ref >60)
GLUCOSE: 98 mg/dL (ref 65–99)
POTASSIUM: 3.7 mmol/L (ref 3.5–5.1)
SODIUM: 129 mmol/L — AB (ref 135–145)
Total Bilirubin: 0.6 mg/dL (ref 0.3–1.2)
Total Protein: 7.4 g/dL (ref 6.5–8.1)

## 2015-01-14 LAB — CBC WITH DIFFERENTIAL/PLATELET
BASOS PCT: 1 %
Basophils Absolute: 0.1 10*3/uL (ref 0–0.1)
EOS ABS: 0.2 10*3/uL (ref 0–0.7)
EOS PCT: 3 %
HCT: 33.9 % — ABNORMAL LOW (ref 35.0–47.0)
HEMOGLOBIN: 11.4 g/dL — AB (ref 12.0–16.0)
LYMPHS ABS: 1.3 10*3/uL (ref 1.0–3.6)
Lymphocytes Relative: 21 %
MCH: 28.5 pg (ref 26.0–34.0)
MCHC: 33.6 g/dL (ref 32.0–36.0)
MCV: 84.7 fL (ref 80.0–100.0)
MONOS PCT: 15 %
Monocytes Absolute: 0.9 10*3/uL (ref 0.2–0.9)
NEUTROS PCT: 60 %
Neutro Abs: 3.7 10*3/uL (ref 1.4–6.5)
PLATELETS: 386 10*3/uL (ref 150–440)
RBC: 4.01 MIL/uL (ref 3.80–5.20)
RDW: 14.2 % (ref 11.5–14.5)
WBC: 6.1 10*3/uL (ref 3.6–11.0)

## 2015-01-14 LAB — SEDIMENTATION RATE: Sed Rate: 93 mm/hr — ABNORMAL HIGH (ref 0–30)

## 2015-01-14 NOTE — Progress Notes (Signed)
Patient would like to talk to MD regarding flu shot.  Also wants to discuss treatment plan, side effects, etc.

## 2015-01-14 NOTE — Progress Notes (Signed)
[place] @ [facility] [phone]) [phone]  Fax:([phone]) [place] OB: Aug 22, 1931  MR#: [id number]  JYN#:[id number]  Patient Care Team: Kirk Ruths, MD as PCP - General (Internal Medicine)  CHIEF COMPLAINT:  Chief Complaint  Patient presents with  . OTHER    Chief Complaint/Diagnosis:   1.  Status post chemoradiation therapy.  Followed by extended adjuvant therapy under NSABP protocol  Unblinded .  Patient was on Aromasin.   2.  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  IgM monoclonal spike.  3.  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 INTERVAL HISTORY: 81 year old lady is seen today because number of complaints that patient started having increasing headache.  Patient had temporal artery biopsy which has been reported to be negative. As last time patient continues to have dizziness.  Had severe burning pain in both lower extremity responded to Neurontin.  Patient is feeling weak and tired.  Has abdominal pain which is nonspecific mainly in the lower abdominal area.  Appetite started declining. REVIEW OF SYSTEMS:   Gen. status: Patient is somewhat feeling weak and tired. HEENT: Visual disturbances and headache Temporal artery biopsy negative Lungs: No cough no shortness of breath no hemoptysis GI: No nausea no vomiting no diarrhea Declining appetite.  Lower abdominal discomfort.  GU: No dysuria hematuria Musculoskeletal system by the back pain had kyphoplasty done Lower extremity swelling in the night time Skin: No rash Neurological system frequent dizziness.  Was admitted in the hospital with syncopal attack  As per HPI. Otherwise, a complete review of systems is negative.  PAST MEDICAL HISTORY: Past Medical History  Diagnosis Date  . Hypertension   . Cancer [facility])     PAST SURGICAL HISTORY: Past Surgical History  Procedure Laterality Date  . Mastectomy    . Back surgery     Significant History/PMH:   M  protein lymphoma:    gall bladder attack: Feb 2009   BBB: Feb 2009 bundle branch block, Feb 2009   Breast cancer:    Hypercholesterolemia:    HTN:    Oncology Protocol: NSABP B-33   Oncology Protocol: NSABP B-33    Pt is on Exemestane (Aromasin)   R cataract removal 03/2013:    Mastectomy: LEFT  Preventive Screening:  Has patient had any of the following test? Mammography (1)   Last Mammography: 03/2010(1)   PF SH: Comments: No family history of colorectal cancer, breast cancer, or ovarian cancer.  Social History: noncontributory, negative alcohol, negative tobacco  Additional Past Medical and Surgical History: is been reviewed from multiple previous   HEALTH MAINTENANCE: Social History  Substance Use Topics  . Smoking status: Never Smoker   . Smokeless tobacco: None  . Alcohol Use: No      Allergies  Allergen Reactions  . Ultram [Tramadol] Other (See Comments)    Pt states that it causes her to faint.     Current Outpatient Prescriptions  Medication Sig Dispense Refill  . acetaminophen (TYLENOL) 500 MG tablet Take 1,000 mg by mouth every 6 (six) hours as needed for mild pain or headache.    . carvedilol (COREG) 3.125 MG tablet Take 3.125 mg by mouth 2 (two) times daily.    . celecoxib (CELEBREX) 200 MG capsule Take 200 mg by mouth daily.    . cetirizine (ZYRTEC) 10 MG tablet Take 10 mg by mouth daily.    . cholecalciferol (VITAMIN D) 1000 UNITS tablet Take 1,000 Units by mouth daily.    Marland Kitchen  fluticasone (FLONASE) 50 MCG/ACT nasal spray Place 2 sprays into both nostrils 2 (two) times daily as needed for rhinitis.    Marland Kitchen gabapentin (NEURONTIN) 100 MG capsule Take 2 capsules (200 mg total) by mouth 3 (three) times daily. 180 capsule 1  . HYDROcodone-acetaminophen (NORCO/VICODIN) 5-325 MG per tablet Take 0.5 tablets by mouth every 6 (six) hours as needed for moderate pain. 30 tablet 0  . losartan-hydrochlorothiazide (HYZAAR) 100-12.5 MG per tablet Take 1 tablet by mouth  daily.    Marland Kitchen omeprazole (PRILOSEC) 20 MG capsule Take 20 mg by mouth daily.    . predniSONE (DELTASONE) 10 MG tablet 6 tabs po x 1 day, 5 tabs po x 1 day, 4 tabs po x 1 day, 3 tabs po x 1 day, 2 tabs po x 1 day, 1 tab po x 1 day, then stop 21 tablet 0  . sennosides-docusate sodium (SENOKOT-S) 8.6-50 MG tablet Take 3 tablets by mouth at bedtime.    . simvastatin (ZOCOR) 40 MG tablet Take 40 mg by mouth at bedtime.     No current facility-administered medications for this visit.    OBJECTIVE:  Filed Vitals:   01/14/15 1500  BP: 136/68  Pulse: 80  Temp: 96.1 F (35.6 C)     Body mass index is 25.84 kg/(m^2).    EOG FBS:1 - Symptomatic but completely ambulatory  PHYSICAL EXAM: General  status: Performance status is good.  Patient has not lost significant weight HE ENT: No evidence of stomatitis. Sclera and conjunctivae :: No jaundice.   pale looking. Lungs: Air  entry equal on both sides.  No rhonchi.  No rales.  Cardiac: Heart sounds are normal.  No pericardial rub.  No murmur. Lymphatic system: Cervical, axillary, inguinal, lymph nodes not palpable GI: Abdomen is soft.  No ascites.  Liver spleen not palpable.  No tenderness.  Bowel sounds are within normal limit Lower extremity: No edema Neurological system: Higher functions, cranial nerves intact no evidence of peripheral neuropathy. Skin: No rash.  No ecchymosis.. Examination of both breasts: Left breast mastectomy no evidence of recurrent disease Right breast free of masses   LAB RESULTS:  CBC Latest Ref Rng 01/14/2015 11/27/2014  WBC 3.6 - 11.0 K/uL 6.1 -  Hemoglobin 12.0 - 16.0 g/dL 11.4(L) 12.0  Hematocrit 35.0 - 47.0 % 33.9(L) -  Platelets 150 - 440 K/uL 386 -    Appointment on 01/14/2015  Component Date Value Ref Range Status  . WBC 01/14/2015 6.1  3.6 - 11.0 K/uL Final  . RBC 01/14/2015 4.01  3.80 - 5.20 MIL/uL Final  . Hemoglobin 01/14/2015 11.4* 12.0 - 16.0 g/dL Final  . HCT 01/14/2015 33.9* 35.0 - 47.0 % Final    . MCV 01/14/2015 84.7  80.0 - 100.0 fL Final  . MCH 01/14/2015 28.5  26.0 - 34.0 pg Final  . MCHC 01/14/2015 33.6  32.0 - 36.0 g/dL Final  . RDW 01/14/2015 14.2  11.5 - 14.5 % Final  . Platelets 01/14/2015 386  150 - 440 K/uL Final  . Neutrophils Relative % 01/14/2015 60   Final  . Neutro Abs 01/14/2015 3.7  1.4 - 6.5 K/uL Final  . Lymphocytes Relative 01/14/2015 21   Final  . Lymphs Abs 01/14/2015 1.3  1.0 - 3.6 K/uL Final  . Monocytes Relative 01/14/2015 15   Final  . Monocytes Absolute 01/14/2015 0.9  0.2 - 0.9 K/uL Final  . Eosinophils Relative 01/14/2015 3   Final  . Eosinophils Absolute 01/14/2015 0.2  0 -  0.7 K/uL Final  . Basophils Relative 01/14/2015 1   Final  . Basophils Absolute 01/14/2015 0.1  0 - 0.1 K/uL Final  . Sodium 01/14/2015 129* 135 - 145 mmol/L Final  . Potassium 01/14/2015 3.7  3.5 - 5.1 mmol/L Final  . Chloride 01/14/2015 97* 101 - 111 mmol/L Final  . CO2 01/14/2015 26  22 - 32 mmol/L Final  . Glucose, Bld 01/14/2015 98  65 - 99 mg/dL Final  . BUN 01/14/2015 16  6 - 20 mg/dL Final  . Creatinine, Ser 01/14/2015 0.91  0.44 - 1.00 mg/dL Final  . Calcium 01/14/2015 8.6* 8.9 - 10.3 mg/dL Final  . Total Protein 01/14/2015 7.4  6.5 - 8.1 g/dL Final  . Albumin 01/14/2015 3.5  3.5 - 5.0 g/dL Final  . AST 01/14/2015 21  15 - 41 U/L Final  . ALT 01/14/2015 13* 14 - 54 U/L Final  . Alkaline Phosphatase 01/14/2015 66  38 - 126 U/L Final  . Total Bilirubin 01/14/2015 0.6  0.3 - 1.2 mg/dL Final  . GFR calc non Af Amer 01/14/2015 58* >60 mL/min Final  . GFR calc Af Amer 01/14/2015 >60  >60 mL/min Final   Comment: (NOTE) The eGFR has been calculated using the CKD EPI equation. This calculation has not been validated in all clinical situations. eGFR's persistently <60 mL/min signify possible Chronic Kidney Disease.   . Anion gap 01/14/2015 6  5 - 15 Final       STUDIES: No results found.  ASSESSMENT: Dizziness and syncopal attack in a patient was up monoclonal  gammopathy with IgM. MRI scan has been reviewed does not show any evidence of abnormality . 2.  Patient had hypercalcemia. Hypercalcemia has resolved. PTH level is within acceptable range,however non-parathyroid hypercalcemia cannot be ruled out and calcium level would be monitored carefully As is not clear whether lower level of IgM monoclonal spike is responsible for some of the symptoms we would like to start patient on prednisone and observe whether there is any improvement in the symptoms Possibility of rituximab can be considered however I doubt there is any clear cut indication at present time to treat patient with rituximab but careful consideration can be given the patient continues taking neurological symptoms and no other etiologies were found MEDICAL DECISION MAKING:  Waldenstrm's macroglobulinemia Serum     [place] t is within normal limit Patient and number of medical complaints that does not fit into Waldenstrm's macroglobulinemia In general patient's condition has been declining for a while Patient previously had high risk breast cancer as well as cutaneous T-cell lymphoma I would like to go in and get a CT scan of abdomen and chest to rule out any lymphadenopathy.  Or any late recurrence of the breast cancer (patient had multiple positive lymph node at the initial diagnosis) Patient understood that.  He is difficult to explain all the symptoms based on Waldenstrm's macroglobulinemia with very low-level lops IgM   No matching staging information was found for the patient.  Forest Gleason, MD   01/14/2015 3:43 PM

## 2015-01-15 LAB — PROTEIN ELECTROPHORESIS, SERUM
A/G Ratio: 0.8 (ref 0.7–1.7)
ALBUMIN ELP: 3 g/dL (ref 2.9–4.4)
ALPHA-1-GLOBULIN: 0.3 g/dL (ref 0.0–0.4)
ALPHA-2-GLOBULIN: 0.9 g/dL (ref 0.4–1.0)
Beta Globulin: 2.1 g/dL — ABNORMAL HIGH (ref 0.7–1.3)
GAMMA GLOBULIN: 0.5 g/dL (ref 0.4–1.8)
GLOBULIN, TOTAL: 3.8 g/dL (ref 2.2–3.9)
M-Spike, %: 1.2 g/dL — ABNORMAL HIGH
TOTAL PROTEIN ELP: 6.8 g/dL (ref 6.0–8.5)

## 2015-01-16 LAB — MULTIPLE MYELOMA PANEL, SERUM
ALBUMIN/GLOB SERPL: 1 (ref 0.7–1.7)
ALPHA 1: 0.3 g/dL (ref 0.0–0.4)
Albumin SerPl Elph-Mcnc: 3.4 g/dL (ref 2.9–4.4)
Alpha2 Glob SerPl Elph-Mcnc: 0.7 g/dL (ref 0.4–1.0)
B-Globulin SerPl Elph-Mcnc: 2.4 g/dL — ABNORMAL HIGH (ref 0.7–1.3)
GAMMA GLOB SERPL ELPH-MCNC: 0.2 g/dL — AB (ref 0.4–1.8)
GLOBULIN, TOTAL: 3.5 g/dL (ref 2.2–3.9)
IGA: 27 mg/dL — AB (ref 64–422)
IgG (Immunoglobin G), Serum: 243 mg/dL — ABNORMAL LOW (ref 700–1600)
IgM, Serum: 2278 mg/dL — ABNORMAL HIGH (ref 26–217)
M Protein SerPl Elph-Mcnc: 1 g/dL — ABNORMAL HIGH
Total Protein ELP: 6.9 g/dL (ref 6.0–8.5)

## 2015-01-21 ENCOUNTER — Ambulatory Visit
Admission: RE | Admit: 2015-01-21 | Discharge: 2015-01-21 | Disposition: A | Discharge status: Home / Self Care | Payer: Medicare Other | Source: Ambulatory Visit | Attending: Oncology | Admitting: Oncology

## 2015-01-21 DIAGNOSIS — R600 Localized edema: Secondary | ICD-10-CM | POA: Insufficient documentation

## 2015-01-21 DIAGNOSIS — R1900 Intra-abdominal and pelvic swelling, mass and lump, unspecified site: Secondary | ICD-10-CM | POA: Insufficient documentation

## 2015-01-21 NOTE — Progress Notes (Signed)
*  PRELIMINARY RESULTS* Echocardiogram 2D Echocardiogram has been performed.  Carly Peck 01/21/2015, 10:23 AM

## 2015-01-23 ENCOUNTER — Ambulatory Visit
Admission: RE | Admit: 2015-01-23 | Discharge: 2015-01-23 | Disposition: A | Discharge status: Home / Self Care | Payer: Medicare Other | Source: Ambulatory Visit | Attending: Oncology | Admitting: Oncology

## 2015-01-23 DIAGNOSIS — M7989 Other specified soft tissue disorders: Secondary | ICD-10-CM | POA: Diagnosis not present

## 2015-01-23 DIAGNOSIS — R140 Abdominal distension (gaseous): Secondary | ICD-10-CM | POA: Insufficient documentation

## 2015-01-23 DIAGNOSIS — N9489 Other specified conditions associated with female genital organs and menstrual cycle: Secondary | ICD-10-CM | POA: Diagnosis not present

## 2015-01-23 DIAGNOSIS — R600 Localized edema: Secondary | ICD-10-CM

## 2015-01-23 DIAGNOSIS — C8590 Non-Hodgkin lymphoma, unspecified, unspecified site: Secondary | ICD-10-CM

## 2015-01-23 DIAGNOSIS — R1900 Intra-abdominal and pelvic swelling, mass and lump, unspecified site: Secondary | ICD-10-CM

## 2015-01-23 MED ORDER — IOHEXOL 300 MG/ML  SOLN
100.0000 mL | Freq: Once | INTRAMUSCULAR | Status: AC | PRN
  Administered 2015-01-23: 100 mL via INTRAVENOUS

## 2015-01-27 ENCOUNTER — Ambulatory Visit: Payer: Medicare Other

## 2015-02-02 ENCOUNTER — Encounter: Payer: Self-pay | Admitting: Oncology

## 2015-02-02 ENCOUNTER — Inpatient Hospital Stay (HOSPITAL_BASED_OUTPATIENT_CLINIC_OR_DEPARTMENT_OTHER): Payer: Medicare Other | Admitting: Internal Medicine

## 2015-02-02 ENCOUNTER — Inpatient Hospital Stay: Payer: Medicare Other | Attending: Oncology | Admitting: Oncology

## 2015-02-02 VITALS — BP 139/69 | HR 84 | Temp 96.3°F | Wt 161.4 lb

## 2015-02-02 DIAGNOSIS — M79662 Pain in left lower leg: Secondary | ICD-10-CM | POA: Diagnosis not present

## 2015-02-02 DIAGNOSIS — R609 Edema, unspecified: Secondary | ICD-10-CM | POA: Insufficient documentation

## 2015-02-02 DIAGNOSIS — I10 Essential (primary) hypertension: Secondary | ICD-10-CM

## 2015-02-02 DIAGNOSIS — R55 Syncope and collapse: Secondary | ICD-10-CM | POA: Insufficient documentation

## 2015-02-02 DIAGNOSIS — C880 Waldenstrom macroglobulinemia: Secondary | ICD-10-CM

## 2015-02-02 DIAGNOSIS — Z853 Personal history of malignant neoplasm of breast: Secondary | ICD-10-CM | POA: Diagnosis not present

## 2015-02-02 DIAGNOSIS — D472 Monoclonal gammopathy: Secondary | ICD-10-CM

## 2015-02-02 DIAGNOSIS — M79661 Pain in right lower leg: Secondary | ICD-10-CM

## 2015-02-02 DIAGNOSIS — Z79899 Other long term (current) drug therapy: Secondary | ICD-10-CM | POA: Insufficient documentation

## 2015-02-02 MED ORDER — GABAPENTIN 100 MG PO CAPS: 200.0000 mg | ORAL_CAPSULE | Freq: Three times a day (TID) | ORAL | Status: DC

## 2015-02-02 NOTE — Progress Notes (Signed)
Patient was seen by Dr.Brahmden

## 2015-02-02 NOTE — Progress Notes (Signed)
Wants to know if she will remain on Gabapentin.  If so would like a prescription for a year.

## 2015-02-02 NOTE — Progress Notes (Signed)
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 degen changes; M spike- 1.2gm/dl  # 2016-Temporal A Bx-neg [headaches/diziness]; Syncope ? Cardiac  # LEFT LE swelling/pain ? Neuropathy  # 1997-LEFT BREAST CA Stage III  ER-POS;s/p MASTEC;s/p chemo; s/p RT NSABP- 33;on Aromasin;   # ESR- Elevated ? From Monoclonal gammopathy  INTERVAL HISTORY:  81 year old female patient with above history of IgM-MGUS is here for follow-up for elevated ESR/history of syncopal episode. Patient had 3 syncopal episodes lasting~10 minutes or so. The last episode was approximately 3 months ago. No precipitating factor. She states to have workup with a 2-D echo/ and also monitor placed/MRI brain negative.  Patient complains of left lower extremity pain; intermittent swelling. Not helped with Neurontin.  Otherwise not losing any weight. No nausea vomiting. No other aches and pains.  REVIEW OF SYSTEMS:  A complete 10 point review of system is done which is negative except mentioned above/history of present illness.   PAST MEDICAL HISTORY :  Past Medical History  Diagnosis Date  . Hypertension   . Cancer [facility])     PAST SURGICAL HISTORY :   Past Surgical History  Procedure Laterality Date  . Mastectomy    . Back surgery      FAMILY HISTORY :  No family history on file.  SOCIAL HISTORY:   Social History  Substance Use Topics  . Smoking status: Never Smoker   . Smokeless tobacco: Not on file  . Alcohol Use: No    ALLERGIES:  is allergic to ultram.  MEDICATIONS:  Current Outpatient Prescriptions  Medication Sig Dispense Refill  . acetaminophen  (TYLENOL) 500 MG tablet Take 1,000 mg by mouth every 6 (six) hours as needed for mild pain or headache.    . carvedilol (COREG) 3.125 MG tablet Take 3.125 mg by mouth 2 (two) times daily.    . celecoxib (CELEBREX) 200 MG capsule Take 200 mg by mouth daily.    . cetirizine (ZYRTEC) 10 MG tablet Take 10 mg by mouth daily.    . cholecalciferol (VITAMIN D) 1000 UNITS tablet Take 1,000 Units by mouth daily.    . clobetasol ointment (TEMOVATE) 0.05 % APPLY TO AFFECTED AREA TWICE DAILY AS NEEDED  3  . fluticasone (FLONASE) 50 MCG/ACT nasal spray Place 2 sprays into both nostrils 2 (two) times daily as needed for rhinitis.    Marland Kitchen gabapentin (NEURONTIN) 100 MG capsule Take 2 capsules (200 mg total) by mouth 3 (three) times daily. 540 capsule 3  . HYDROcodone-acetaminophen (NORCO/VICODIN) 5-325 MG per tablet Take 0.5 tablets by mouth every 6 (six) hours as needed for moderate pain. 30 tablet 0  . losartan-hydrochlorothiazide (HYZAAR) 100-12.5 MG per tablet Take 1 tablet by mouth daily.    Marland Kitchen omeprazole (PRILOSEC) 20 MG capsule Take 20 mg by mouth daily.    . predniSONE (DELTASONE) 10 MG tablet 6 tabs po x 1 day, 5 tabs po x 1 day, 4 tabs po x 1 day, 3 tabs po x 1 day, 2 tabs po x 1 day, 1 tab po x 1 day, then stop 21 tablet 0  . sennosides-docusate sodium (SENOKOT-S) 8.6-50 MG tablet Take 3 tablets by  mouth at bedtime.    . simvastatin (ZOCOR) 40 MG tablet Take 40 mg by mouth at bedtime.     No current facility-administered medications for this visit.    PHYSICAL EXAMINATION:  There were no vitals taken for this visit.  There were no vitals filed for this visit.  GENERAL: Well-nourished well-developed; Alert, no distress and comfortable.  She is alone.  EYES: no pallor or icterus OROPHARYNX: no thrush or ulceration; good dentition  NECK: supple, no masses felt LYMPH:  no palpable lymphadenopathy in the cervical, axillary or inguinal regions LUNGS: clear to auscultation and  No wheeze or  crackles HEART/CVS: regular rate & rhythm and no murmurs; No lower extremity edema ABDOMEN:abdomen soft, non-tender and normal bowel sounds Musculoskeletal:no cyanosis of digits and no clubbing  PSYCH: alert & oriented x 3 with fluent speech NEURO: no focal motor/sensory deficits SKIN:  no rashes or significant lesions  LABORATORY DATA:  I have reviewed the data as listed    Component Value Date/Time   NA 129* 01/14/2015 1421   NA 132* 02/02/2014 1947   K 3.7 01/14/2015 1421   K 4.3 02/02/2014 1947   CL 97* 01/14/2015 1421   CL 100 02/02/2014 1947   CO2 26 01/14/2015 1421   CO2 21 02/02/2014 1947   GLUCOSE 98 01/14/2015 1421   GLUCOSE 107* 02/02/2014 1947   BUN 16 01/14/2015 1421   BUN 22* 02/02/2014 1947   CREATININE 0.91 01/14/2015 1421   CREATININE 0.94 02/02/2014 1947   CALCIUM 8.6* 01/14/2015 1421   CALCIUM 10.2 12/08/2014 1540   CALCIUM 9.3 02/02/2014 1947   PROT 7.4 01/14/2015 1421   PROT 7.7 10/21/2013 1106   ALBUMIN 3.5 01/14/2015 1421   ALBUMIN 3.3* 10/21/2013 1106   AST 21 01/14/2015 1421   AST 16 10/21/2013 1106   ALT 13* 01/14/2015 1421   ALT 25 10/21/2013 1106   ALKPHOS 66 01/14/2015 1421   ALKPHOS 83 10/21/2013 1106   BILITOT 0.6 01/14/2015 1421   BILITOT 0.5 10/21/2013 1106   GFRNONAA 58* 01/14/2015 1421   GFRNONAA >60 02/02/2014 1947   GFRNONAA 57* 10/21/2013 1106   GFRAA >60 01/14/2015 1421   GFRAA >60 02/02/2014 1947   GFRAA >60 10/21/2013 1106    No results found for: SPEP, UPEP  Lab Results  Component Value Date   WBC 6.1 01/14/2015   NEUTROABS 3.7 01/14/2015   HGB 11.4* 01/14/2015   HCT 33.9* 01/14/2015   MCV 84.7 01/14/2015   PLT 386 01/14/2015      Chemistry      Component Value Date/Time   NA 129* 01/14/2015 1421   NA 132* 02/02/2014 1947   K 3.7 01/14/2015 1421   K 4.3 02/02/2014 1947   CL 97* 01/14/2015 1421   CL 100 02/02/2014 1947   CO2 26 01/14/2015 1421   CO2 21 02/02/2014 1947   BUN 16 01/14/2015 1421   BUN 22*  02/02/2014 1947   CREATININE 0.91 01/14/2015 1421   CREATININE 0.94 02/02/2014 1947      Component Value Date/Time   CALCIUM 8.6* 01/14/2015 1421   CALCIUM 10.2 12/08/2014 1540   CALCIUM 9.3 02/02/2014 1947   ALKPHOS 66 01/14/2015 1421   ALKPHOS 83 10/21/2013 1106   AST 21 01/14/2015 1421   AST 16 10/21/2013 1106   ALT 13* 01/14/2015 1421   ALT 25 10/21/2013 1106   BILITOT 0.6 01/14/2015 1421   BILITOT 0.5 10/21/2013 1106       RADIOGRAPHIC STUDIES: I have  personally reviewed the radiological images as listed and agreed with the findings in the report. No results found.   ASSESSMENT & PLAN:   # MGUS-IgM lambda- most recent M protein October 2016-1.2 g/dL [June 2016-0.9 g]. Clinically and on imaging no evidence of organ involvement based on CAT scan. Less likely Waldenstrm's macroglobulinemia. Clinically I do not think this is causing any of her syncopal episodes [C discussion below]. I would not recommend any treatment at this time.   #Syncopal episode- unclear etiology. This appears to be cardiac related- needs to have further workup including ? Tilt table test. Follow-up with Dr. Kowalski/cardiology.  # Question pain/tingling and numbness of the left lower extremity- Dopplers negative. Question neuropathy. Recommend neurology evaluation with question nerve conduction study. IgM monoclonal gammopathy has been associated with neuropathy. Unfortunately treatments are controversial; sometimes Rituxan Could be used.   # Elevated ESR- likely artifactual given- M protein in the blood. CRP would be a better test   # History of breast cancer- ER/PR positive high risk/stage III- indefinite Aromasin. Clinically no evidence of recurrence.   Discussed with Dr. Malena Peer primary oncologist.  All questions were answered. The patient knows to call the clinic with any problems, questions or concerns. No barriers to learning was detected.     Cammie Sickle, MD 02/02/2015 4:51  PM

## 2015-02-09 DIAGNOSIS — R600 Localized edema: Secondary | ICD-10-CM | POA: Insufficient documentation

## 2015-02-09 DIAGNOSIS — I447 Left bundle-branch block, unspecified: Secondary | ICD-10-CM | POA: Insufficient documentation

## 2015-02-24 DIAGNOSIS — E782 Mixed hyperlipidemia: Secondary | ICD-10-CM | POA: Insufficient documentation

## 2015-02-24 DIAGNOSIS — I10 Essential (primary) hypertension: Secondary | ICD-10-CM | POA: Insufficient documentation

## 2015-03-11 ENCOUNTER — Telehealth: Payer: Self-pay | Admitting: *Deleted

## 2015-03-11 DIAGNOSIS — C880 Waldenstrom macroglobulinemia: Secondary | ICD-10-CM

## 2015-03-11 MED ORDER — GABAPENTIN 100 MG PO CAPS: 300.0000 mg | ORAL_CAPSULE | Freq: Three times a day (TID) | ORAL | Status: DC

## 2015-03-11 NOTE — Telephone Encounter (Signed)
Called patient and left message to inform her that MD wants her to increase her neurotin to 3 tablets 3 times a day instead of the current 2 tablets 3 times a day.

## 2015-03-11 NOTE — Telephone Encounter (Signed)
Per MD pt needs to increase neurontin to 3 capsules three times a day instead of 2 capsules three times a day.

## 2015-04-06 DIAGNOSIS — R200 Anesthesia of skin: Secondary | ICD-10-CM | POA: Insufficient documentation

## 2015-04-06 DIAGNOSIS — R202 Paresthesia of skin: Secondary | ICD-10-CM

## 2015-04-07 DIAGNOSIS — M79606 Pain in leg, unspecified: Secondary | ICD-10-CM | POA: Insufficient documentation

## 2015-05-04 ENCOUNTER — Other Ambulatory Visit: Payer: Self-pay | Admitting: *Deleted

## 2015-05-04 DIAGNOSIS — C880 Waldenstrom macroglobulinemia: Secondary | ICD-10-CM

## 2015-05-05 ENCOUNTER — Encounter: Payer: Self-pay | Admitting: Oncology

## 2015-05-05 ENCOUNTER — Inpatient Hospital Stay: Payer: Medicare Other | Attending: Oncology

## 2015-05-05 ENCOUNTER — Inpatient Hospital Stay (HOSPITAL_BASED_OUTPATIENT_CLINIC_OR_DEPARTMENT_OTHER): Payer: Medicare Other | Admitting: Oncology

## 2015-05-05 VITALS — BP 132/73 | HR 90 | Temp 98.2°F | Wt 162.9 lb

## 2015-05-05 DIAGNOSIS — G629 Polyneuropathy, unspecified: Secondary | ICD-10-CM | POA: Diagnosis not present

## 2015-05-05 DIAGNOSIS — Z9012 Acquired absence of left breast and nipple: Secondary | ICD-10-CM | POA: Insufficient documentation

## 2015-05-05 DIAGNOSIS — Z79899 Other long term (current) drug therapy: Secondary | ICD-10-CM | POA: Insufficient documentation

## 2015-05-05 DIAGNOSIS — E7800 Pure hypercholesterolemia, unspecified: Secondary | ICD-10-CM | POA: Diagnosis not present

## 2015-05-05 DIAGNOSIS — I10 Essential (primary) hypertension: Secondary | ICD-10-CM | POA: Diagnosis not present

## 2015-05-05 DIAGNOSIS — R42 Dizziness and giddiness: Secondary | ICD-10-CM | POA: Diagnosis not present

## 2015-05-05 DIAGNOSIS — C880 Waldenstrom macroglobulinemia: Secondary | ICD-10-CM | POA: Insufficient documentation

## 2015-05-05 DIAGNOSIS — D472 Monoclonal gammopathy: Secondary | ICD-10-CM | POA: Diagnosis not present

## 2015-05-05 DIAGNOSIS — E039 Hypothyroidism, unspecified: Secondary | ICD-10-CM | POA: Diagnosis not present

## 2015-05-05 DIAGNOSIS — Z923 Personal history of irradiation: Secondary | ICD-10-CM | POA: Insufficient documentation

## 2015-05-05 DIAGNOSIS — R531 Weakness: Secondary | ICD-10-CM | POA: Diagnosis not present

## 2015-05-05 DIAGNOSIS — E871 Hypo-osmolality and hyponatremia: Secondary | ICD-10-CM | POA: Insufficient documentation

## 2015-05-05 DIAGNOSIS — G4730 Sleep apnea, unspecified: Secondary | ICD-10-CM | POA: Diagnosis not present

## 2015-05-05 DIAGNOSIS — F419 Anxiety disorder, unspecified: Secondary | ICD-10-CM

## 2015-05-05 DIAGNOSIS — N920 Excessive and frequent menstruation with regular cycle: Secondary | ICD-10-CM

## 2015-05-05 DIAGNOSIS — R5383 Other fatigue: Secondary | ICD-10-CM | POA: Diagnosis not present

## 2015-05-05 DIAGNOSIS — F319 Bipolar disorder, unspecified: Secondary | ICD-10-CM

## 2015-05-05 DIAGNOSIS — C8400 Mycosis fungoides, unspecified site: Secondary | ICD-10-CM | POA: Insufficient documentation

## 2015-05-05 DIAGNOSIS — Z853 Personal history of malignant neoplasm of breast: Secondary | ICD-10-CM | POA: Diagnosis not present

## 2015-05-05 DIAGNOSIS — F4310 Post-traumatic stress disorder, unspecified: Secondary | ICD-10-CM

## 2015-05-05 DIAGNOSIS — Z9221 Personal history of antineoplastic chemotherapy: Secondary | ICD-10-CM | POA: Diagnosis not present

## 2015-05-05 DIAGNOSIS — Z8711 Personal history of peptic ulcer disease: Secondary | ICD-10-CM

## 2015-05-05 DIAGNOSIS — Z87891 Personal history of nicotine dependence: Secondary | ICD-10-CM

## 2015-05-05 LAB — CBC WITH DIFFERENTIAL/PLATELET
BASOS PCT: 1 %
Basophils Absolute: 0 10*3/uL (ref 0–0.1)
Eosinophils Absolute: 0.1 10*3/uL (ref 0–0.7)
Eosinophils Relative: 1 %
HEMATOCRIT: 33.5 % — AB (ref 35.0–47.0)
HEMOGLOBIN: 11.6 g/dL — AB (ref 12.0–16.0)
LYMPHS ABS: 0.8 10*3/uL — AB (ref 1.0–3.6)
LYMPHS PCT: 13 %
MCH: 28.9 pg (ref 26.0–34.0)
MCHC: 34.7 g/dL (ref 32.0–36.0)
MCV: 83.3 fL (ref 80.0–100.0)
MONOS PCT: 18 %
Monocytes Absolute: 1.1 10*3/uL — ABNORMAL HIGH (ref 0.2–0.9)
NEUTROS ABS: 4.3 10*3/uL (ref 1.4–6.5)
NEUTROS PCT: 67 %
Platelets: 265 10*3/uL (ref 150–440)
RBC: 4.03 MIL/uL (ref 3.80–5.20)
RDW: 13.8 % (ref 11.5–14.5)
WBC: 6.4 10*3/uL (ref 3.6–11.0)

## 2015-05-05 LAB — COMPREHENSIVE METABOLIC PANEL
ALBUMIN: 3.5 g/dL (ref 3.5–5.0)
ALK PHOS: 74 U/L (ref 38–126)
ALT: 13 U/L — ABNORMAL LOW (ref 14–54)
ANION GAP: 6 (ref 5–15)
AST: 16 U/L (ref 15–41)
BILIRUBIN TOTAL: 0.7 mg/dL (ref 0.3–1.2)
BUN: 16 mg/dL (ref 6–20)
CO2: 25 mmol/L (ref 22–32)
Calcium: 9.2 mg/dL (ref 8.9–10.3)
Chloride: 94 mmol/L — ABNORMAL LOW (ref 101–111)
Creatinine, Ser: 0.7 mg/dL (ref 0.44–1.00)
GFR calc Af Amer: >60 mL/min (ref >60)
GFR calc non Af Amer: >60 mL/min (ref >60)
GLUCOSE: 104 mg/dL — AB (ref 65–99)
POTASSIUM: 3.7 mmol/L (ref 3.5–5.1)
Sodium: 125 mmol/L — ABNORMAL LOW (ref 135–145)
Total Protein: 7.3 g/dL (ref 6.5–8.1)

## 2015-05-05 LAB — SEDIMENTATION RATE: Sed Rate: 71 mm/hr — ABNORMAL HIGH (ref 0–30)

## 2015-05-05 NOTE — Progress Notes (Signed)
Patient was seen by Venetie @ [facility] [phone]  Fax:([phone]) [place] OB: Oct 02, 1931  MR#: [id number]  GQQ#:[id number]  Patient Care Team: Kirk Ruths, MD as PCP - General (Internal Medicine)  CHIEF COMPLAINT:  Chief Complaint  Patient presents with  . Macroglobulinemia of [place]    Chief Complaint/Diagnosis:   1.  Status post chemoradiation therapy.  Followed by extended adjuvant therapy under NSABP protocol  Unblinded .  Patient was on Aromasin.   2.  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  IgM monoclonal spike.  3.  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 INTERVAL HISTORY: 82 year old lady is seen today because number of complaints that patient started having increasing headache.  Patient had temporal artery biopsy which has been reported to be negative. Patient is here for ongoing evaluation and treatment consideration.  Since last evaluation patient had evaluation by cardiologist And dermatologist.  Swelling in lower extremity has improved.  Amitriptyline has been added which has resulted in improvement in the pain. Myeloma panel is pending.   REVIEW OF SYSTEMS:   Gen. status: Patient is somewhat feeling weak and tired. HEENT: Visual disturbances and headache Temporal artery biopsy negative Lungs: No cough no shortness of breath no hemoptysis GI: No nausea no vomiting no diarrhea Declining appetite.  Lower abdominal discomfort.  GU: No dysuria hematuria Musculoskeletal system by the back pain had kyphoplasty done Lower extremity swelling in the night time Skin: No rash Neurological system frequent dizziness.  Was admitted in the hospital with syncopal attack  As per HPI. Otherwise, a complete review of systems is negative.  PAST MEDICAL HISTORY: Past Medical History  Diagnosis Date  . Hypertension   . Cancer [facility])     PAST SURGICAL HISTORY: Past Surgical History    Procedure Laterality Date  . Mastectomy    . Back surgery     Significant History/PMH:   M protein lymphoma:    gall bladder attack: Feb 2009   BBB: Feb 2009 bundle branch block, Feb 2009   Breast cancer:    Hypercholesterolemia:    HTN:    Oncology Protocol: NSABP B-33   Oncology Protocol: NSABP B-33    Pt is on Exemestane (Aromasin)   R cataract removal 03/2013:    Mastectomy: LEFT  Preventive Screening:  Has patient had any of the following test? Mammography (1)   Last Mammography: 03/2010(1)   PF SH: Comments: No family history of colorectal cancer, breast cancer, or ovarian cancer.  Social History: noncontributory, negative alcohol, negative tobacco  Additional Past Medical and Surgical History: is been reviewed from multiple previous   HEALTH MAINTENANCE: Social History  Substance Use Topics  . Smoking status: Never Smoker   . Smokeless tobacco: None  . Alcohol Use: No      Allergies  Allergen Reactions  . Ultram [Tramadol] Other (See Comments)    Pt states that it causes her to faint.     Current Outpatient Prescriptions  Medication Sig Dispense Refill  . acetaminophen (TYLENOL) 500 MG tablet Take 1,000 mg by mouth every 6 (six) hours as needed for mild pain or headache.    . carvedilol (COREG) 3.125 MG tablet Take 3.125 mg by mouth 2 (two) times daily.    . celecoxib (CELEBREX) 200 MG capsule Take 200 mg by mouth daily.    . cetirizine (ZYRTEC) 10 MG tablet Take 10 mg by mouth daily.    . cholecalciferol (VITAMIN  D) 1000 UNITS tablet Take 1,000 Units by mouth daily.    . clobetasol ointment (TEMOVATE) 0.05 % APPLY TO AFFECTED AREA TWICE DAILY AS NEEDED  3  . fluticasone (FLONASE) 50 MCG/ACT nasal spray Place 2 sprays into both nostrils 2 (two) times daily as needed for rhinitis.    Marland Kitchen gabapentin (NEURONTIN) 100 MG capsule Take 3 capsules (300 mg total) by mouth 3 (three) times daily.    Marland Kitchen HYDROcodone-acetaminophen (NORCO/VICODIN) 5-325 MG per  tablet Take 0.5 tablets by mouth every 6 (six) hours as needed for moderate pain. 30 tablet 0  . losartan-hydrochlorothiazide (HYZAAR) 100-12.5 MG per tablet Take 1 tablet by mouth daily.    Marland Kitchen omeprazole (PRILOSEC) 20 MG capsule Take 20 mg by mouth daily.    . predniSONE (DELTASONE) 10 MG tablet 6 tabs po x 1 day, 5 tabs po x 1 day, 4 tabs po x 1 day, 3 tabs po x 1 day, 2 tabs po x 1 day, 1 tab po x 1 day, then stop 21 tablet 0  . sennosides-docusate sodium (SENOKOT-S) 8.6-50 MG tablet Take 3 tablets by mouth at bedtime.    . simvastatin (ZOCOR) 40 MG tablet Take 40 mg by mouth at bedtime.     No current facility-administered medications for this visit.    OBJECTIVE:  Filed Vitals:   05/05/15 1033  BP: 132/73  Pulse: 90  Temp: 98.2 F (36.8 C)     Body mass index is 26.31 kg/(m^2).    EOG FBS:1 - Symptomatic but completely ambulatory  PHYSICAL EXAM: General  status: Performance status is good.  Patient has not lost significant weight HE ENT: No evidence of stomatitis. Sclera and conjunctivae :: No jaundice.   pale looking. Lungs: Air  entry equal on both sides.  No rhonchi.  No rales.  Cardiac: Heart sounds are normal.  No pericardial rub.  No murmur. Lymphatic system: Cervical, axillary, inguinal, lymph nodes not palpable GI: Abdomen is soft.  No ascites.  Liver spleen not palpable.  No tenderness.  Bowel sounds are within normal limit Lower extremity: No edema Neurological system: Higher functions, cranial nerves intact no evidence of peripheral neuropathy. Skin: No rash.  No ecchymosis.. Examination of both breasts: Left breast mastectomy no evidence of recurrent disease Right breast free of masses   LAB RESULTS:  CBC Latest Ref Rng 05/05/2015 01/14/2015  WBC 3.6 - 11.0 K/uL 6.4 6.1  Hemoglobin 12.0 - 16.0 g/dL 11.6(L) 11.4(L)  Hematocrit 35.0 - 47.0 % 33.5(L) 33.9(L)  Platelets 150 - 440 K/uL 265 386    Appointment on 05/05/2015  Component Date Value Ref Range Status   . WBC 05/05/2015 6.4  3.6 - 11.0 K/uL Final  . RBC 05/05/2015 4.03  3.80 - 5.20 MIL/uL Final  . Hemoglobin 05/05/2015 11.6* 12.0 - 16.0 g/dL Final  . HCT 05/05/2015 33.5* 35.0 - 47.0 % Final  . MCV 05/05/2015 83.3  80.0 - 100.0 fL Final  . MCH 05/05/2015 28.9  26.0 - 34.0 pg Final  . MCHC 05/05/2015 34.7  32.0 - 36.0 g/dL Final  . RDW 05/05/2015 13.8  11.5 - 14.5 % Final  . Platelets 05/05/2015 265  150 - 440 K/uL Final  . Neutrophils Relative % 05/05/2015 67   Final  . Neutro Abs 05/05/2015 4.3  1.4 - 6.5 K/uL Final  . Lymphocytes Relative 05/05/2015 13   Final  . Lymphs Abs 05/05/2015 0.8* 1.0 - 3.6 K/uL Final  . Monocytes Relative 05/05/2015 18   Final  .  Monocytes Absolute 05/05/2015 1.1* 0.2 - 0.9 K/uL Final  . Eosinophils Relative 05/05/2015 1   Final  . Eosinophils Absolute 05/05/2015 0.1  0 - 0.7 K/uL Final  . Basophils Relative 05/05/2015 1   Final  . Basophils Absolute 05/05/2015 0.0  0 - 0.1 K/uL Final  . Sodium 05/05/2015 125* 135 - 145 mmol/L Final  . Potassium 05/05/2015 3.7  3.5 - 5.1 mmol/L Final  . Chloride 05/05/2015 94* 101 - 111 mmol/L Final  . CO2 05/05/2015 25  22 - 32 mmol/L Final  . Glucose, Bld 05/05/2015 104* 65 - 99 mg/dL Final  . BUN 05/05/2015 16  6 - 20 mg/dL Final  . Creatinine, Ser 05/05/2015 0.70  0.44 - 1.00 mg/dL Final  . Calcium 05/05/2015 9.2  8.9 - 10.3 mg/dL Final  . Total Protein 05/05/2015 7.3  6.5 - 8.1 g/dL Final  . Albumin 05/05/2015 3.5  3.5 - 5.0 g/dL Final  . AST 05/05/2015 16  15 - 41 U/L Final  . ALT 05/05/2015 13* 14 - 54 U/L Final  . Alkaline Phosphatase 05/05/2015 74  38 - 126 U/L Final  . Total Bilirubin 05/05/2015 0.7  0.3 - 1.2 mg/dL Final  . GFR calc non Af Amer 05/05/2015 >60  >60 mL/min Final  . GFR calc Af Amer 05/05/2015 >60  >60 mL/min Final   Comment: (NOTE) The eGFR has been calculated using the CKD EPI equation. This calculation has not been validated in all clinical situations. eGFR's persistently <60 mL/min  signify possible Chronic Kidney Disease.   . Anion gap 05/05/2015 6  5 - 15 Final       STUDIES: No results found.  ASSESSMENT: Dizziness and syncopal attack in a patient was up monoclonal gammopathy with IgM. MRI scan has been reviewed does not show any evidence of abnormality 2. hyponatraemia stable  3.  Neuropathy and back pain has improved after addition of amitriptyline Swelling in  lower extremity is improved Patient was advised to decrease Neurontin to twice a day and see whether pain in lower extremity remains under control.  If pain is better than over P of times he would do speak to one tablet a day MEDICAL DECISION MAKING:  [place] macroglobulinemia Serum     [place] t is within normal limit   No matching staging information was found for the patient.  Forest Gleason, MD   05/05/2015 10:53 AM

## 2015-05-06 ENCOUNTER — Encounter: Payer: Self-pay | Admitting: Oncology

## 2015-05-06 LAB — KAPPA/LAMBDA LIGHT CHAINS
KAPPA FREE LGHT CHN: 7.68 mg/L (ref 3.30–19.40)
Kappa, lambda light chain ratio: 0.25 — ABNORMAL LOW (ref 0.26–1.65)
LAMDA FREE LIGHT CHAINS: 30.35 mg/L — AB (ref 5.71–26.30)

## 2015-05-08 LAB — MULTIPLE MYELOMA PANEL, SERUM
ALBUMIN/GLOB SERPL: 1 (ref 0.7–1.7)
ALPHA 1: 0.3 g/dL (ref 0.0–0.4)
ALPHA2 GLOB SERPL ELPH-MCNC: 0.8 g/dL (ref 0.4–1.0)
Albumin SerPl Elph-Mcnc: 3.2 g/dL (ref 2.9–4.4)
B-Globulin SerPl Elph-Mcnc: 1.1 g/dL (ref 0.7–1.3)
Gamma Glob SerPl Elph-Mcnc: 1.3 g/dL (ref 0.4–1.8)
Globulin, Total: 3.5 g/dL (ref 2.2–3.9)
IGG (IMMUNOGLOBIN G), SERUM: 306 mg/dL — AB (ref 700–1600)
IGM, SERUM: 2186 mg/dL — AB (ref 26–217)
IgA: 30 mg/dL — ABNORMAL LOW (ref 64–422)
M Protein SerPl Elph-Mcnc: 0.8 g/dL — ABNORMAL HIGH
TOTAL PROTEIN ELP: 6.7 g/dL (ref 6.0–8.5)

## 2015-06-30 ENCOUNTER — Ambulatory Visit
Admission: RE | Admit: 2015-06-30 | Discharge: 2015-06-30 | Disposition: A | Discharge status: Home / Self Care | Payer: Medicare Other | Source: Ambulatory Visit | Attending: Oncology | Admitting: Oncology

## 2015-06-30 ENCOUNTER — Other Ambulatory Visit: Payer: Self-pay | Admitting: Oncology

## 2015-06-30 DIAGNOSIS — C880 Waldenstrom macroglobulinemia: Secondary | ICD-10-CM

## 2015-06-30 DIAGNOSIS — Z9012 Acquired absence of left breast and nipple: Secondary | ICD-10-CM | POA: Diagnosis not present

## 2015-06-30 DIAGNOSIS — Z1231 Encounter for screening mammogram for malignant neoplasm of breast: Secondary | ICD-10-CM

## 2015-06-30 HISTORY — DX: Malignant neoplasm of unspecified site of unspecified female breast: C50.919

## 2015-09-02 ENCOUNTER — Encounter
Admission: RE | Admit: 2015-09-02 | Discharge: 2015-09-02 | Disposition: A | Discharge status: Home / Self Care | Payer: Medicare Other | Source: Ambulatory Visit | Attending: Orthopedic Surgery | Admitting: Orthopedic Surgery

## 2015-09-02 DIAGNOSIS — Z01818 Encounter for other preprocedural examination: Secondary | ICD-10-CM | POA: Insufficient documentation

## 2015-09-02 DIAGNOSIS — R600 Localized edema: Secondary | ICD-10-CM | POA: Insufficient documentation

## 2015-09-02 DIAGNOSIS — M1990 Unspecified osteoarthritis, unspecified site: Secondary | ICD-10-CM | POA: Insufficient documentation

## 2015-09-02 DIAGNOSIS — R202 Paresthesia of skin: Secondary | ICD-10-CM | POA: Insufficient documentation

## 2015-09-02 DIAGNOSIS — I447 Left bundle-branch block, unspecified: Secondary | ICD-10-CM | POA: Insufficient documentation

## 2015-09-02 DIAGNOSIS — E782 Mixed hyperlipidemia: Secondary | ICD-10-CM | POA: Insufficient documentation

## 2015-09-02 DIAGNOSIS — M79606 Pain in leg, unspecified: Secondary | ICD-10-CM | POA: Diagnosis not present

## 2015-09-02 DIAGNOSIS — I10 Essential (primary) hypertension: Secondary | ICD-10-CM | POA: Insufficient documentation

## 2015-09-02 HISTORY — DX: Gastro-esophageal reflux disease without esophagitis: K21.9

## 2015-09-02 HISTORY — DX: Unspecified osteoarthritis, unspecified site: M19.90

## 2015-09-02 HISTORY — DX: Anxiety disorder, unspecified: F41.9

## 2015-09-02 HISTORY — DX: Hyperlipidemia, unspecified: E78.5

## 2015-09-02 HISTORY — DX: Left bundle-branch block, unspecified: I44.7

## 2015-09-02 HISTORY — DX: Polyneuropathy, unspecified: G62.9

## 2015-09-02 LAB — CBC
HCT: 34.1 % — ABNORMAL LOW (ref 35.0–47.0)
HEMOGLOBIN: 11.4 g/dL — AB (ref 12.0–16.0)
MCH: 28.8 pg (ref 26.0–34.0)
MCHC: 33.5 g/dL (ref 32.0–36.0)
MCV: 86.1 fL (ref 80.0–100.0)
Platelets: 295 10*3/uL (ref 150–440)
RBC: 3.96 MIL/uL (ref 3.80–5.20)
RDW: 13.6 % (ref 11.5–14.5)
WBC: 7 10*3/uL (ref 3.6–11.0)

## 2015-09-02 LAB — COMPREHENSIVE METABOLIC PANEL
ALK PHOS: 69 U/L (ref 38–126)
ALT: 12 U/L — AB (ref 14–54)
AST: 16 U/L (ref 15–41)
Albumin: 3.7 g/dL (ref 3.5–5.0)
Anion gap: 9 (ref 5–15)
BUN: 13 mg/dL (ref 6–20)
CALCIUM: 9.6 mg/dL (ref 8.9–10.3)
CO2: 25 mmol/L (ref 22–32)
CREATININE: 0.69 mg/dL (ref 0.44–1.00)
Chloride: 105 mmol/L (ref 101–111)
GFR calc non Af Amer: >60 mL/min (ref >60)
GLUCOSE: 105 mg/dL — AB (ref 65–99)
Potassium: 3.8 mmol/L (ref 3.5–5.1)
SODIUM: 139 mmol/L (ref 135–145)
Total Bilirubin: 0.5 mg/dL (ref 0.3–1.2)
Total Protein: 7.4 g/dL (ref 6.5–8.1)

## 2015-09-02 LAB — TYPE AND SCREEN
ABO/RH(D): A POS
ANTIBODY SCREEN: NEGATIVE

## 2015-09-02 LAB — PROTIME-INR
INR: 1.14
Prothrombin Time: 14.8 seconds (ref 11.4–15.0)

## 2015-09-02 LAB — URINALYSIS COMPLETE WITH MICROSCOPIC (ARMC ONLY)
BILIRUBIN URINE: NEGATIVE
Glucose, UA: NEGATIVE mg/dL
Hgb urine dipstick: NEGATIVE
Ketones, ur: NEGATIVE mg/dL
Nitrite: NEGATIVE
PH: 6 (ref 5.0–8.0)
Protein, ur: NEGATIVE mg/dL
SQUAMOUS EPITHELIAL / LPF: NONE SEEN
Specific Gravity, Urine: 1.008 (ref 1.005–1.030)

## 2015-09-02 LAB — SURGICAL PCR SCREEN
MRSA, PCR: NEGATIVE
STAPHYLOCOCCUS AUREUS: NEGATIVE

## 2015-09-02 LAB — APTT: aPTT: 31 seconds (ref 24–36)

## 2015-09-02 LAB — SEDIMENTATION RATE: Sed Rate: 78 mm/hr — ABNORMAL HIGH (ref 0–30)

## 2015-09-02 NOTE — Patient Instructions (Signed)
  Your procedure is scheduled on: September 14, 2015 (Monday) Report to [facility] To find out your arrival time please call [phone] between 1PM - 3PM on .September 11, 2015 (Friday)  Remember: Instructions that are not followed completely may result in serious medical risk, up to and including death, or upon the discretion of your surgeon and anesthesiologist your surgery may need to be rescheduled.    __x_ 1. Do not eat food or drink liquids after midnight. No gum chewing or hard candies.     __x__ 2. No Alcohol for 24 hours before or after surgery.   __x__ 3. Do Not Smoke For 24 Hours Prior to Your Surgery.   ____ 4. Bring all medications with you on the day of surgery if instructed.    __x__ 5. Notify your doctor if there is any change in your medical condition     (cold, fever, infections).       Do not wear jewelry, make-up, hairpins, clips or nail polish.  Do not wear lotions, powders, or perfumes. You may wear deodorant.  Do not shave 48 hours prior to surgery. Men may shave face and neck.  Do not bring valuables to the hospital.    [facility] is not responsible for any belongings or valuables.               Contacts, dentures or bridgework may not be worn into surgery.  Leave your suitcase in the car. After surgery it may be brought to your room.  For patients admitted to the hospital, discharge time is determined by your                treatment team.   Patients discharged the day of surgery will not be allowed to drive home.   Please read over the following fact sheets that you were given:   MRSA Information and Surgical Site Infection Prevention   _x___ Take these medicines the morning of surgery with A SIP OF WATER:    1. Carvedilol  2. Losartan  3. Gabapentin  4.Omeprazole (Omeprazole at bedtime on June 25, Sunday night)  5.  6.  ____ Fleet Enema (as directed)   __x_ Use CHG Soap as directed  ____ Use inhalers on the day of  surgery  ____ Stop metformin 2 days prior to surgery    ____ Take 1/2 of usual insulin dose the night before surgery and none on the morning of surgery.   _x___ Stop Coumadin/Plavix/aspirin on (N/A)  __x__ Stop Anti-inflammatories on (NO NSAIDS) Tylenol ok to take for pain if needed   ____ Stop supplements until after surgery.    ____ Bring C-Pap to the hospital.

## 2015-09-02 NOTE — Pre-Procedure Instructions (Signed)
EKG COMPARED WITH 2016 AND KNOWN LBBB

## 2015-09-03 LAB — URINE CULTURE: SPECIAL REQUESTS: NORMAL

## 2015-09-07 NOTE — Care Management (Signed)
Pre-admit: Received notification from patient requesting assistance with DME Gilford Rile). This RNCM attempted to call patient back at [phone] and left message. Patient seems anxious; she did attend joint class last Wednesday but still have more questions as per phone message to this [facility] today. I left message to reassure her that CM would work with her when she arrived at hospital and not to worry about equipment although it was good to plan ahead. Advised patient to call this RNCM with any other questions.

## 2015-09-14 ENCOUNTER — Inpatient Hospital Stay: Payer: Medicare Other

## 2015-09-14 ENCOUNTER — Encounter
Admission: RE | Disposition: A | Discharge status: Skilled Nursing Facility | Payer: Self-pay | Source: Ambulatory Visit | Attending: Orthopedic Surgery

## 2015-09-14 ENCOUNTER — Inpatient Hospital Stay: Payer: Medicare Other | Admitting: Anesthesiology

## 2015-09-14 ENCOUNTER — Inpatient Hospital Stay
Admission: RE | Admit: 2015-09-14 | Discharge: 2015-09-17 | DRG: 470 | Disposition: A | Discharge status: Skilled Nursing Facility | Payer: Medicare Other | Source: Ambulatory Visit | Attending: Orthopedic Surgery | Admitting: Orthopedic Surgery

## 2015-09-14 ENCOUNTER — Encounter: Payer: Self-pay | Admitting: Orthopedic Surgery

## 2015-09-14 DIAGNOSIS — Z853 Personal history of malignant neoplasm of breast: Secondary | ICD-10-CM

## 2015-09-14 DIAGNOSIS — C880 Waldenstrom macroglobulinemia: Secondary | ICD-10-CM | POA: Diagnosis present

## 2015-09-14 DIAGNOSIS — K219 Gastro-esophageal reflux disease without esophagitis: Secondary | ICD-10-CM | POA: Diagnosis present

## 2015-09-14 DIAGNOSIS — I447 Left bundle-branch block, unspecified: Secondary | ICD-10-CM | POA: Diagnosis present

## 2015-09-14 DIAGNOSIS — Z96659 Presence of unspecified artificial knee joint: Secondary | ICD-10-CM

## 2015-09-14 DIAGNOSIS — E785 Hyperlipidemia, unspecified: Secondary | ICD-10-CM | POA: Diagnosis present

## 2015-09-14 DIAGNOSIS — G629 Polyneuropathy, unspecified: Secondary | ICD-10-CM | POA: Diagnosis present

## 2015-09-14 DIAGNOSIS — M1711 Unilateral primary osteoarthritis, right knee: Secondary | ICD-10-CM | POA: Diagnosis present

## 2015-09-14 DIAGNOSIS — I10 Essential (primary) hypertension: Secondary | ICD-10-CM | POA: Diagnosis present

## 2015-09-14 DIAGNOSIS — Z823 Family history of stroke: Secondary | ICD-10-CM

## 2015-09-14 HISTORY — PX: KNEE ARTHROPLASTY: SHX992

## 2015-09-14 LAB — GLUCOSE, CAPILLARY: GLUCOSE-CAPILLARY: 100 mg/dL — AB (ref 65–99)

## 2015-09-14 SURGERY — ARTHROPLASTY, KNEE, TOTAL, USING IMAGELESS COMPUTER-ASSISTED NAVIGATION
Anesthesia: Spinal | Laterality: Right | Wound class: Clean

## 2015-09-14 MED ORDER — CHLORHEXIDINE GLUCONATE 4 % EX LIQD: 60.0000 mL | Freq: Once | CUTANEOUS | Status: DC

## 2015-09-14 MED ORDER — PHENYLEPHRINE HCL 10 MG/ML IJ SOLN
INTRAMUSCULAR | Status: DC | PRN
  Administered 2015-09-14 (×3): 100 ug via INTRAVENOUS

## 2015-09-14 MED ORDER — MORPHINE SULFATE (PF) 2 MG/ML IV SOLN
2.0000 mg | INTRAVENOUS | Status: DC | PRN
  Administered 2015-09-14 – 2015-09-15 (×3): 2 mg via INTRAVENOUS
  Filled 2015-09-14 (×3): qty 1

## 2015-09-14 MED ORDER — FLUTICASONE PROPIONATE 50 MCG/ACT NA SUSP
2.0000 | Freq: Two times a day (BID) | NASAL | Status: DC | PRN
  Filled 2015-09-14: qty 16

## 2015-09-14 MED ORDER — CARVEDILOL 3.125 MG PO TABS
3.1250 mg | ORAL_TABLET | Freq: Two times a day (BID) | ORAL | Status: DC
  Administered 2015-09-15 – 2015-09-17 (×5): 3.125 mg via ORAL
  Filled 2015-09-14 (×6): qty 1

## 2015-09-14 MED ORDER — VITAMIN D 1000 UNITS PO TABS
2000.0000 [IU] | ORAL_TABLET | Freq: Every day | ORAL | Status: DC
  Administered 2015-09-15 – 2015-09-17 (×3): 2000 [IU] via ORAL
  Filled 2015-09-14 (×3): qty 2

## 2015-09-14 MED ORDER — ALUM & MAG HYDROXIDE-SIMETH 200-200-20 MG/5ML PO SUSP: 30.0000 mL | ORAL | Status: DC | PRN

## 2015-09-14 MED ORDER — DIPHENHYDRAMINE HCL 12.5 MG/5ML PO ELIX: 12.5000 mg | ORAL_SOLUTION | ORAL | Status: DC | PRN

## 2015-09-14 MED ORDER — SODIUM CHLORIDE FLUSH 0.9 % IV SOLN
INTRAVENOUS | Status: AC
  Filled 2015-09-14: qty 10

## 2015-09-14 MED ORDER — BUPIVACAINE LIPOSOME 1.3 % IJ SUSP
INTRAMUSCULAR | Status: AC
  Filled 2015-09-14: qty 20

## 2015-09-14 MED ORDER — PANTOPRAZOLE SODIUM 40 MG PO TBEC
40.0000 mg | DELAYED_RELEASE_TABLET | Freq: Two times a day (BID) | ORAL | Status: DC
  Administered 2015-09-14 – 2015-09-17 (×6): 40 mg via ORAL
  Filled 2015-09-14 (×6): qty 1

## 2015-09-14 MED ORDER — NEOMYCIN-POLYMYXIN B GU 40-200000 IR SOLN
Status: DC | PRN
  Administered 2015-09-14: 14 mL

## 2015-09-14 MED ORDER — PHENOL 1.4 % MT LIQD
1.0000 | OROMUCOSAL | Status: DC | PRN
  Filled 2015-09-14: qty 177

## 2015-09-14 MED ORDER — ACETAMINOPHEN 10 MG/ML IV SOLN
INTRAVENOUS | Status: AC
  Filled 2015-09-14: qty 100

## 2015-09-14 MED ORDER — SODIUM CHLORIDE 0.9 % IV SOLN
INTRAVENOUS | Status: DC
  Administered 2015-09-14 – 2015-09-15 (×2): via INTRAVENOUS

## 2015-09-14 MED ORDER — SODIUM CHLORIDE 0.9 % IV SOLN
INTRAVENOUS | Status: DC | PRN
  Administered 2015-09-14: 60 mL

## 2015-09-14 MED ORDER — SIMVASTATIN 40 MG PO TABS
40.0000 mg | ORAL_TABLET | Freq: Every day | ORAL | Status: DC
  Administered 2015-09-14 – 2015-09-16 (×3): 40 mg via ORAL
  Filled 2015-09-14 (×3): qty 1

## 2015-09-14 MED ORDER — CLOBETASOL PROPIONATE 0.05 % EX OINT
TOPICAL_OINTMENT | Freq: Two times a day (BID) | CUTANEOUS | Status: DC | PRN
  Filled 2015-09-14: qty 15

## 2015-09-14 MED ORDER — SENNOSIDES-DOCUSATE SODIUM 8.6-50 MG PO TABS
1.0000 | ORAL_TABLET | Freq: Two times a day (BID) | ORAL | Status: DC
  Administered 2015-09-14 – 2015-09-16 (×5): 1 via ORAL
  Filled 2015-09-14 (×6): qty 1

## 2015-09-14 MED ORDER — TETRACAINE HCL 1 % IJ SOLN
INTRAMUSCULAR | Status: DC | PRN
  Administered 2015-09-14: .8 mg via INTRASPINAL

## 2015-09-14 MED ORDER — PROPOFOL 500 MG/50ML IV EMUL
INTRAVENOUS | Status: DC | PRN
  Administered 2015-09-14: 25 ug/kg/min via INTRAVENOUS

## 2015-09-14 MED ORDER — ACETAMINOPHEN 10 MG/ML IV SOLN
INTRAVENOUS | Status: DC | PRN
  Administered 2015-09-14: 1000 mg via INTRAVENOUS

## 2015-09-14 MED ORDER — FERROUS SULFATE 325 (65 FE) MG PO TABS
325.0000 mg | ORAL_TABLET | Freq: Two times a day (BID) | ORAL | Status: DC
  Administered 2015-09-15 – 2015-09-17 (×2): 325 mg via ORAL
  Filled 2015-09-14 (×4): qty 1

## 2015-09-14 MED ORDER — BUPIVACAINE HCL (PF) 0.5 % IJ SOLN
INTRAMUSCULAR | Status: DC | PRN
  Administered 2015-09-14: 2.9 mL

## 2015-09-14 MED ORDER — ACETAMINOPHEN 650 MG RE SUPP: 650.0000 mg | Freq: Four times a day (QID) | RECTAL | Status: DC | PRN

## 2015-09-14 MED ORDER — CEFAZOLIN SODIUM-DEXTROSE 2-4 GM/100ML-% IV SOLN
INTRAVENOUS | Status: AC
  Administered 2015-09-14: 2 g via INTRAVENOUS
  Filled 2015-09-14: qty 100

## 2015-09-14 MED ORDER — ENOXAPARIN SODIUM 30 MG/0.3ML ~~LOC~~ SOLN
30.0000 mg | Freq: Two times a day (BID) | SUBCUTANEOUS | Status: DC
  Administered 2015-09-15 – 2015-09-17 (×5): 30 mg via SUBCUTANEOUS
  Filled 2015-09-14 (×5): qty 0.3

## 2015-09-14 MED ORDER — CEFAZOLIN SODIUM-DEXTROSE 2-4 GM/100ML-% IV SOLN: 2.0000 g | INTRAVENOUS | Status: DC

## 2015-09-14 MED ORDER — METOCLOPRAMIDE HCL 10 MG PO TABS
10.0000 mg | ORAL_TABLET | Freq: Three times a day (TID) | ORAL | Status: AC
  Administered 2015-09-14 – 2015-09-16 (×6): 10 mg via ORAL
  Filled 2015-09-14 (×6): qty 1

## 2015-09-14 MED ORDER — BUPIVACAINE HCL (PF) 0.25 % IJ SOLN
INTRAMUSCULAR | Status: AC
  Filled 2015-09-14: qty 60

## 2015-09-14 MED ORDER — SODIUM CHLORIDE 0.9 % IJ SOLN
INTRAMUSCULAR | Status: AC
  Filled 2015-09-14: qty 50

## 2015-09-14 MED ORDER — ACETAMINOPHEN 325 MG PO TABS
650.0000 mg | ORAL_TABLET | Freq: Four times a day (QID) | ORAL | Status: DC | PRN
  Administered 2015-09-16 – 2015-09-17 (×3): 650 mg via ORAL
  Filled 2015-09-14 (×3): qty 2

## 2015-09-14 MED ORDER — OXYCODONE HCL 5 MG PO TABS
5.0000 mg | ORAL_TABLET | ORAL | Status: DC | PRN
  Administered 2015-09-14 (×2): 5 mg via ORAL
  Administered 2015-09-15: 10 mg via ORAL
  Administered 2015-09-15: 5 mg via ORAL
  Administered 2015-09-15 – 2015-09-16 (×3): 10 mg via ORAL
  Administered 2015-09-16: 5 mg via ORAL
  Filled 2015-09-14 (×2): qty 2
  Filled 2015-09-14: qty 1
  Filled 2015-09-14 (×2): qty 2
  Filled 2015-09-14: qty 1
  Filled 2015-09-14: qty 2
  Filled 2015-09-14 (×2): qty 1

## 2015-09-14 MED ORDER — LOSARTAN POTASSIUM 50 MG PO TABS
100.0000 mg | ORAL_TABLET | Freq: Every day | ORAL | Status: DC
  Administered 2015-09-15 – 2015-09-17 (×3): 100 mg via ORAL
  Filled 2015-09-14 (×3): qty 2

## 2015-09-14 MED ORDER — LORATADINE 10 MG PO TABS
10.0000 mg | ORAL_TABLET | Freq: Every day | ORAL | Status: DC
  Administered 2015-09-15 – 2015-09-17 (×3): 10 mg via ORAL
  Filled 2015-09-14 (×3): qty 1

## 2015-09-14 MED ORDER — MAGNESIUM HYDROXIDE 400 MG/5ML PO SUSP
30.0000 mL | Freq: Every day | ORAL | Status: DC | PRN
  Administered 2015-09-15 – 2015-09-16 (×2): 30 mL via ORAL
  Filled 2015-09-14 (×2): qty 30

## 2015-09-14 MED ORDER — SODIUM CHLORIDE 0.9 % IV SOLN
Freq: Once | INTRAVENOUS | Status: AC
  Administered 2015-09-14: 20:00:00 via INTRAVENOUS

## 2015-09-14 MED ORDER — NEOMYCIN-POLYMYXIN B GU 40-200000 IR SOLN
Status: AC
  Filled 2015-09-14: qty 20

## 2015-09-14 MED ORDER — ONDANSETRON HCL 4 MG/2ML IJ SOLN: 4.0000 mg | Freq: Once | INTRAMUSCULAR | Status: DC | PRN

## 2015-09-14 MED ORDER — BISACODYL 10 MG RE SUPP
10.0000 mg | Freq: Every day | RECTAL | Status: DC | PRN
  Administered 2015-09-16: 10 mg via RECTAL
  Filled 2015-09-14: qty 1

## 2015-09-14 MED ORDER — CELECOXIB 200 MG PO CAPS
200.0000 mg | ORAL_CAPSULE | Freq: Every day | ORAL | Status: DC
  Administered 2015-09-15 – 2015-09-17 (×3): 200 mg via ORAL
  Filled 2015-09-14 (×3): qty 1

## 2015-09-14 MED ORDER — SODIUM CHLORIDE 0.9 % IV SOLN
INTRAVENOUS | Status: DC | PRN
  Administered 2015-09-14 (×2): via INTRAVENOUS

## 2015-09-14 MED ORDER — MENTHOL 3 MG MT LOZG
1.0000 | LOZENGE | OROMUCOSAL | Status: DC | PRN
  Filled 2015-09-14: qty 9

## 2015-09-14 MED ORDER — PROPOFOL 10 MG/ML IV BOLUS
INTRAVENOUS | Status: DC | PRN
  Administered 2015-09-14: 40 mg via INTRAVENOUS

## 2015-09-14 MED ORDER — GABAPENTIN 100 MG PO CAPS
200.0000 mg | ORAL_CAPSULE | Freq: Two times a day (BID) | ORAL | Status: DC
  Administered 2015-09-14 – 2015-09-17 (×6): 200 mg via ORAL
  Filled 2015-09-14 (×6): qty 2

## 2015-09-14 MED ORDER — ONDANSETRON HCL 4 MG/2ML IJ SOLN
4.0000 mg | Freq: Four times a day (QID) | INTRAMUSCULAR | Status: DC | PRN
  Administered 2015-09-15: 4 mg via INTRAVENOUS
  Filled 2015-09-14: qty 2

## 2015-09-14 MED ORDER — BUPIVACAINE HCL (PF) 0.25 % IJ SOLN
INTRAMUSCULAR | Status: DC | PRN
  Administered 2015-09-14: 60 mL

## 2015-09-14 MED ORDER — TORSEMIDE 5 MG PO TABS
2.5000 mg | ORAL_TABLET | ORAL | Status: DC
  Filled 2015-09-14: qty 0.5

## 2015-09-14 MED ORDER — FLEET ENEMA 7-19 GM/118ML RE ENEM: 1.0000 | ENEMA | Freq: Once | RECTAL | Status: DC | PRN

## 2015-09-14 MED ORDER — ONDANSETRON HCL 4 MG PO TABS: 4.0000 mg | ORAL_TABLET | Freq: Four times a day (QID) | ORAL | Status: DC | PRN

## 2015-09-14 MED ORDER — CEFAZOLIN SODIUM-DEXTROSE 2-4 GM/100ML-% IV SOLN
2.0000 g | Freq: Four times a day (QID) | INTRAVENOUS | Status: AC
  Administered 2015-09-14 – 2015-09-15 (×4): 2 g via INTRAVENOUS
  Filled 2015-09-14 (×4): qty 100

## 2015-09-14 MED ORDER — ACETAMINOPHEN 10 MG/ML IV SOLN
1000.0000 mg | Freq: Four times a day (QID) | INTRAVENOUS | Status: AC
  Administered 2015-09-14 – 2015-09-15 (×4): 1000 mg via INTRAVENOUS
  Filled 2015-09-14 (×4): qty 100

## 2015-09-14 MED ORDER — FENTANYL CITRATE (PF) 100 MCG/2ML IJ SOLN: 25.0000 ug | INTRAMUSCULAR | Status: DC | PRN

## 2015-09-14 MED ORDER — LACTATED RINGERS IV SOLN
INTRAVENOUS | Status: DC
  Administered 2015-09-14: 10:00:00 via INTRAVENOUS

## 2015-09-14 MED ORDER — BUPIVACAINE-EPINEPHRINE (PF) 0.25% -1:200000 IJ SOLN
INTRAMUSCULAR | Status: AC
  Filled 2015-09-14: qty 30

## 2015-09-14 MED ORDER — MIDAZOLAM HCL 5 MG/5ML IJ SOLN
INTRAMUSCULAR | Status: DC | PRN
  Administered 2015-09-14 (×2): 1 mg via INTRAVENOUS

## 2015-09-14 SURGICAL SUPPLY — 60 items
AUTOTRANSFUS HAS 1/8 (MISCELLANEOUS) ×2
BATTERY INSTRU NAVIGATION (MISCELLANEOUS) ×8 IMPLANT
BLADE SAW 1 (BLADE) ×2 IMPLANT
BLADE SAW 1/2 (BLADE) ×2 IMPLANT
BLADE SAW 70X12.5 (BLADE) ×2 IMPLANT
BONE CEMENT GENTAMICIN (Cement) ×4 IMPLANT
CANISTER SUCT 1200ML W/VALVE (MISCELLANEOUS) ×2 IMPLANT
CANISTER SUCT 3000ML (MISCELLANEOUS) ×4 IMPLANT
CAPT KNEE TOTAL 3 ATTUNE ×2 IMPLANT
CATH TRAY METER 16FR LF (MISCELLANEOUS) ×2 IMPLANT
CEMENT BONE GENTAMICIN 40 (Cement) ×2 IMPLANT
COOLER POLAR GLACIER W/PUMP (MISCELLANEOUS) ×2 IMPLANT
CUFF TOURN 24 STER (MISCELLANEOUS) ×2 IMPLANT
CUFF TOURN 30 STER DUAL PORT (MISCELLANEOUS) IMPLANT
DRAPE SHEET LG 3/4 BI-LAMINATE (DRAPES) ×2 IMPLANT
DRSG DERMACEA 8X12 NADH (GAUZE/BANDAGES/DRESSINGS) ×2 IMPLANT
DRSG OPSITE POSTOP 4X14 (GAUZE/BANDAGES/DRESSINGS) ×2 IMPLANT
DRSG TEGADERM 4X4.75 (GAUZE/BANDAGES/DRESSINGS) ×2 IMPLANT
DURAPREP 26ML APPLICATOR (WOUND CARE) ×4 IMPLANT
ELECT CAUTERY BLADE 6.4 (BLADE) ×2 IMPLANT
ELECT REM PT RETURN 9FT ADLT (ELECTROSURGICAL) ×2
ELECTRODE REM PT RTRN 9FT ADLT (ELECTROSURGICAL) ×1 IMPLANT
EX-PIN ORTHOLOCK NAV 4X150 (PIN) ×4 IMPLANT
GLOVE BIOGEL M STRL SZ7.5 (GLOVE) ×4 IMPLANT
GLOVE INDICATOR 8.0 STRL GRN (GLOVE) ×2 IMPLANT
GLOVE SURG 9.0 ORTHO LTXF (GLOVE) ×2 IMPLANT
GLOVE SURG ORTHO 9.0 STRL STRW (GLOVE) ×2 IMPLANT
GOWN STRL REUS W/ TWL LRG LVL3 (GOWN DISPOSABLE) ×2 IMPLANT
GOWN STRL REUS W/TWL 2XL LVL3 (GOWN DISPOSABLE) ×2 IMPLANT
GOWN STRL REUS W/TWL LRG LVL3 (GOWN DISPOSABLE) ×2
HANDPIECE SUCTION TUBG SURGILV (MISCELLANEOUS) ×2 IMPLANT
HOLDER FOLEY CATH W/STRAP (MISCELLANEOUS) ×2 IMPLANT
HOOD PEEL AWAY FLYTE STAYCOOL (MISCELLANEOUS) ×4 IMPLANT
KIT RM TURNOVER STRD PROC AR (KITS) ×2 IMPLANT
KNIFE SCULPS 14X20 (INSTRUMENTS) ×2 IMPLANT
NDL SAFETY 18GX1.5 (NEEDLE) ×2 IMPLANT
NEEDLE SPNL 20GX3.5 QUINCKE YW (NEEDLE) ×2 IMPLANT
NS IRRIG 500ML POUR BTL (IV SOLUTION) ×2 IMPLANT
PACK TOTAL KNEE (MISCELLANEOUS) ×2 IMPLANT
PAD WRAPON POLAR KNEE (MISCELLANEOUS) ×1 IMPLANT
PIN DRILL QUICK PACK ×2 IMPLANT
PIN FIXATION 1/8DIA X 3INL (PIN) ×2 IMPLANT
SOL .9 NS 3000ML IRR  AL (IV SOLUTION) ×1
SOL .9 NS 3000ML IRR UROMATIC (IV SOLUTION) ×1 IMPLANT
SOL PREP PVP 2OZ (MISCELLANEOUS) ×2
SOLUTION PREP PVP 2OZ (MISCELLANEOUS) ×1 IMPLANT
SPONGE DRAIN TRACH 4X4 STRL 2S (GAUZE/BANDAGES/DRESSINGS) ×2 IMPLANT
STAPLER SKIN PROX 35W (STAPLE) ×2 IMPLANT
SUCTION FRAZIER HANDLE 10FR (MISCELLANEOUS) ×1
SUCTION TUBE FRAZIER 10FR DISP (MISCELLANEOUS) ×1 IMPLANT
SUT VIC AB 0 CT1 36 (SUTURE) ×2 IMPLANT
SUT VIC AB 1 CT1 36 (SUTURE) ×4 IMPLANT
SUT VIC AB 2-0 CT2 27 (SUTURE) ×2 IMPLANT
SYR 20CC LL (SYRINGE) ×2 IMPLANT
SYR 30ML LL (SYRINGE) IMPLANT
SYR 50ML LL SCALE MARK (SYRINGE) ×2 IMPLANT
SYSTEM AUTOTRANSFUS DUAL TROCR (MISCELLANEOUS) ×1 IMPLANT
TOWEL OR 17X26 4PK STRL BLUE (TOWEL DISPOSABLE) ×2 IMPLANT
TOWER CARTRIDGE SMART MIX (DISPOSABLE) ×2 IMPLANT
WRAPON POLAR PAD KNEE (MISCELLANEOUS) ×2

## 2015-09-14 NOTE — H&P (Signed)
The patient has been re-examined, and the chart reviewed, and there have been no interval changes to the documented history and physical.    The risks, benefits, and alternatives have been discussed at length. The patient expressed understanding of the risks benefits and agreed with plans for surgical intervention.  James P. Hooten, Jr. M.D.    

## 2015-09-14 NOTE — Anesthesia Procedure Notes (Signed)
Spinal Patient location during procedure: OR Start time: 09/14/2015 11:44 AM End time: 09/14/2015 11:49 AM Reason for block: at surgeon's request Staffing Anesthesiologist: Marline Backbone F Performed by: anesthesiologist  Preanesthetic Checklist Completed: patient identified, site marked, surgical consent, pre-op evaluation, timeout performed, IV checked, risks and benefits discussed, monitors and equipment checked and at surgeon's request Spinal Block Patient position: sitting Prep: Betadine Patient monitoring: heart rate and blood pressure Approach: midline Location: L3-4 Injection technique: single-shot Needle Needle type: Quincke  Needle gauge: 25 G Needle length: 9 cm Needle insertion depth: 6 cm Assessment Sensory level: T8

## 2015-09-14 NOTE — Brief Op Note (Signed)
09/14/2015  3:24 PM  PATIENT:  Carly Peck  82 y.o. female  PRE-OPERATIVE DIAGNOSIS:  OSTEOARTHRITIS of the right knee  POST-OPERATIVE DIAGNOSIS:  Same   PROCEDURE:  Procedure(s): COMPUTER ASSISTED TOTAL KNEE ARTHROPLASTY (Right)  SURGEON:  Surgeon(s) and Role:    * Dereck Leep, MD - Primary  ASSISTANTS: Vance Peper, PA   ANESTHESIA:   spinal  EBL:  Total I/O In: 1000 [I.V.:1000] Out: 550 [Urine:500; Blood:50]  BLOOD ADMINISTERED:none  DRAINS: 2 medium drains to a reinfusion system   LOCAL MEDICATIONS USED:  MARCAINE    and OTHER Exparel  SPECIMEN:  No Specimen  DISPOSITION OF SPECIMEN:  N/A  COUNTS:  YES  TOURNIQUET:   90 minutes  DICTATION: .Dragon Dictation  PLAN OF CARE: Admit to inpatient   PATIENT DISPOSITION:  PACU - hemodynamically stable.   Delay start of Pharmacological VTE agent (>24hrs) due to surgical blood loss or risk of bleeding: yes

## 2015-09-14 NOTE — Anesthesia Preprocedure Evaluation (Signed)
Anesthesia Evaluation  Patient identified by MRN, date of birth, ID band Patient awake    Reviewed: Allergy & Precautions, NPO status , Patient's Chart, lab work & pertinent test results  Airway Mallampati: II       Dental  (+) Teeth Intact   Pulmonary neg pulmonary ROS,    breath sounds clear to auscultation       Cardiovascular Exercise Tolerance: Good hypertension, Pt. on home beta blockers + dysrhythmias  Rhythm:Regular     Neuro/Psych  Headaches,    GI/Hepatic Neg liver ROS, GERD  Medicated,  Endo/Other  negative endocrine ROS  Renal/GU negative Renal ROS     Musculoskeletal   Abdominal Normal abdominal exam  (+)   Peds  Hematology  (+) Blood dyscrasia, ,   Anesthesia Other Findings   Reproductive/Obstetrics                             Anesthesia Physical Anesthesia Plan  ASA: II  Anesthesia Plan: Spinal   Post-op Pain Management:    Induction: Intravenous  Airway Management Planned: Natural Airway and Nasal Cannula  Additional Equipment:   Intra-op Plan:   Post-operative Plan:   Informed Consent: I have reviewed the patients History and Physical, chart, labs and discussed the procedure including the risks, benefits and alternatives for the proposed anesthesia with the patient or authorized representative who has indicated his/her understanding and acceptance.     Plan Discussed with: CRNA  Anesthesia Plan Comments:         Anesthesia Quick Evaluation

## 2015-09-14 NOTE — Op Note (Signed)
OPERATIVE NOTE  DATE OF SURGERY:  09/14/2015  PATIENT NAME:  KANARIA SCHATZEL   DOB: February 26, 1932  MRN: [id number]  PRE-OPERATIVE DIAGNOSIS: Degenerative arthrosis of the right knee, primary  POST-OPERATIVE DIAGNOSIS:  Same  PROCEDURE:  Right total knee arthroplasty using computer-assisted navigation  SURGEON:  Marciano Sequin. M.D.  ASSISTANT:  Vance Peper, PA (present and scrubbed throughout the case, critical for assistance with exposure, retraction, instrumentation, and closure)  ANESTHESIA: spinal  ESTIMATED BLOOD LOSS: 50 mL  FLUIDS REPLACED: 1000 mL of crystalloid  TOURNIQUET TIME: 90 minutes  DRAINS: 2 medium drains to a reinfusion system  SOFT TISSUE RELEASES: Anterior cruciate ligament, posterior cruciate ligament, deep medial collateral ligament, patellofemoral ligament   IMPLANTS UTILIZED: DePuy Attune size 4 posterior stabilized femoral component (cemented), size 5 rotating platform tibial component (cemented), 35 mm medialized dome patella (cemented), and a 5 mm stabilized rotating platform polyethylene insert.  INDICATIONS FOR SURGERY: CHERRI FRANCKE is a 82 y.o. year old female with a long history of progressive knee pain. X-rays demonstrated severe degenerative changes in tricompartmental fashion. The patient had not seen any significant improvement despite conservative nonsurgical intervention. After discussion of the risks and benefits of surgical intervention, the patient expressed understanding of the risks benefits and agree with plans for total knee arthroplasty.   The risks, benefits, and alternatives were discussed at length including but not limited to the risks of infection, bleeding, nerve injury, stiffness, blood clots, the need for revision surgery, cardiopulmonary complications, among others, and they were willing to proceed.  PROCEDURE IN DETAIL: The patient was brought into the operating room and, after adequate spinal anesthesia was achieved, a tourniquet  was placed on the patient's upper thigh. The patient's knee and leg were cleaned and prepped with alcohol and DuraPrep and draped in the usual sterile fashion. A "timeout" was performed as per usual protocol. The lower extremity was exsanguinated using an Esmarch, and the tourniquet was inflated to 300 mmHg. An anterior longitudinal incision was made followed by a standard mid vastus approach. The deep fibers of the medial collateral ligament were elevated in a subperiosteal fashion off of the medial flare of the tibia so as to maintain a continuous soft tissue sleeve. The patella was subluxed laterally and the patellofemoral ligament was incised. Inspection of the knee demonstrated severe degenerative changes with full-thickness loss of articular cartilage. Osteophytes were debrided using a rongeur. Anterior and posterior cruciate ligaments were excised. Two 4.0 mm Schanz pins were inserted in the femur and into the tibia for attachment of the array of trackers used for computer-assisted navigation. Hip center was identified using a circumduction technique. Distal landmarks were mapped using the computer. The distal femur and proximal tibia were mapped using the computer. The distal femoral cutting guide was positioned using computer-assisted navigation so as to achieve a 5 distal valgus cut. The femur was sized and it was felt that a size 4 femoral component was appropriate. A size 4 femoral cutting guide was positioned and the anterior cut was performed and verified using the computer. This was followed by completion of the posterior and chamfer cuts. Femoral cutting guide for the central box was then positioned in the center box cut was performed.  Attention was then directed to the proximal tibia. Medial and lateral menisci were excised. The extramedullary tibial cutting guide was positioned using computer-assisted navigation so as to achieve a 0 varus-valgus alignment and 3 posterior slope. The cut was  performed and verified using the  computer. The proximal tibia was sized and it was felt that a size 5 tibial tray was appropriate. Tibial and femoral trials were inserted followed by insertion of a 5 mm polyethylene insert. This allowed for excellent mediolateral soft tissue balancing both in flexion and in full extension. Finally, the patella was cut and prepared so as to accommodate a 35 mm medialized dome patella. A patella trial was placed and the knee was placed through a range of motion with excellent patellar tracking appreciated. The femoral trial was removed after debridement of posterior osteophytes. The central post-hole for the tibial component was reamed followed by insertion of a keel punch. Tibial trials were then removed. Cut surfaces of bone were irrigated with copious amounts of normal saline with antibiotic solution using pulsatile lavage and then suctioned dry. Polymethylmethacrylate cement with gentamicin was prepared in the usual fashion using a vacuum mixer. Cement was applied to the cut surface of the proximal tibia as well as along the undersurface of a size 5 rotating platform tibial component. Tibial component was positioned and impacted into place. Excess cement was removed using [employer] fast streamer. Cement was then applied to the cut surfaces of the femur as well as along the posterior flanges of the size 4 femoral component. The femoral component was positioned and impacted into place. Excess cement was removed using [employer] fast streamer. A 5 mm polyethylene trial was inserted and the knee was brought into full extension with steady axial compression applied. Finally, cement was applied to the backside of a 35 mm medialized dome patella and the patellar component was positioned and patellar clamp applied. Excess cement was removed using [employer] fast streamer. After adequate curing of the cement, the tourniquet was deflated after a total tourniquet time of 90 minutes. Hemostasis was achieved using  electrocautery. The knee was irrigated with copious amounts of normal saline with antibiotic solution using pulsatile lavage and then suctioned dry. 20 mL of 1.3% Exparel and 60 mL of 0.25% Marcaine in 40 mL of normal saline was injected along the posterior capsule, medial and lateral gutters, and along the arthrotomy site. A 5 mm stabilized rotating platform polyethylene insert was inserted and the knee was placed through a range of motion with excellent mediolateral soft tissue balancing appreciated and excellent patellar tracking noted. 2 medium drains were placed in the wound bed and brought out through separate stab incisions to be attached to a reinfusion system. The medial parapatellar portion of the incision was reapproximated using interrupted sutures of #1 Vicryl. Subcutaneous tissue was then injected with a total of 30 cc of 0.25% Marcaine with epinephrine. Subcutaneous tissue was approximated in layers using first #0 Vicryl followed #2-0 Vicryl. The skin was approximated with skin staples. A sterile dressing was applied.  The patient tolerated the procedure well and was transported to the recovery room in stable condition.    Antinette Keough P. Holley Bouche., M.D.

## 2015-09-14 NOTE — Transfer of Care (Signed)
Immediate Anesthesia Transfer of Care Note  Patient: Carly Peck  Procedure(s) Performed: Procedure(s): COMPUTER ASSISTED TOTAL KNEE ARTHROPLASTY (Right)  Patient Location: PACU  Anesthesia Type:Spinal  Level of Consciousness: awake, alert  and oriented  Airway & Oxygen Therapy: Patient Spontanous Breathing  Post-op Assessment: Report given to RN and Post -op Vital signs reviewed and stable  Post vital signs: Reviewed and stable  Last Vitals:  Filed Vitals:   09/14/15 0933 09/14/15 1523  BP: 158/88 113/54  Pulse: 90 73  Temp: 36.5 C 36.9 C  Resp: 16 18    Last Pain: There were no vitals filed for this visit.       Complications: No apparent anesthesia complications

## 2015-09-15 LAB — BASIC METABOLIC PANEL
ANION GAP: 5 (ref 5–15)
BUN: 9 mg/dL (ref 6–20)
CALCIUM: 8.3 mg/dL — AB (ref 8.9–10.3)
CO2: 25 mmol/L (ref 22–32)
Chloride: 106 mmol/L (ref 101–111)
Creatinine, Ser: 0.6 mg/dL (ref 0.44–1.00)
Glucose, Bld: 111 mg/dL — ABNORMAL HIGH (ref 65–99)
Potassium: 3.2 mmol/L — ABNORMAL LOW (ref 3.5–5.1)
SODIUM: 136 mmol/L (ref 135–145)

## 2015-09-15 LAB — CBC
HEMATOCRIT: 26.8 % — AB (ref 35.0–47.0)
Hemoglobin: 9.3 g/dL — ABNORMAL LOW (ref 12.0–16.0)
MCH: 29.7 pg (ref 26.0–34.0)
MCHC: 34.5 g/dL (ref 32.0–36.0)
MCV: 86.1 fL (ref 80.0–100.0)
PLATELETS: 203 10*3/uL (ref 150–440)
RBC: 3.11 MIL/uL — ABNORMAL LOW (ref 3.80–5.20)
RDW: 13.1 % (ref 11.5–14.5)
WBC: 11.3 10*3/uL — AB (ref 3.6–11.0)

## 2015-09-15 MED ORDER — OXYCODONE HCL 5 MG PO TABS: 5.0000 mg | ORAL_TABLET | ORAL | Status: DC | PRN

## 2015-09-15 MED ORDER — POTASSIUM CHLORIDE 20 MEQ PO PACK
40.0000 meq | PACK | Freq: Two times a day (BID) | ORAL | Status: AC
  Administered 2015-09-15 (×2): 40 meq via ORAL
  Filled 2015-09-15 (×2): qty 2

## 2015-09-15 MED ORDER — ENOXAPARIN SODIUM 30 MG/0.3ML ~~LOC~~ SOLN: 30.0000 mg | Freq: Two times a day (BID) | SUBCUTANEOUS | Status: DC

## 2015-09-15 NOTE — Progress Notes (Signed)
   Subjective: 1 Day Post-Op Procedure(s) (LRB): COMPUTER ASSISTED TOTAL KNEE ARTHROPLASTY (Right) Patient reports pain as moderate.   Patient is well, and has had no acute complaints or problems We will start therapy today.  Plan is to go Rehab after hospital stay. no nausea and no vomiting however pt states she was very nausea yesterday after surgery Patient denies any chest pains or shortness of breath. Objective: Vital signs in last 24 hours: Temp:  [97.4 F (36.3 C)-98.6 F (37 C)] 98.6 F (37 C) (06/27 0347) Pulse Rate:  [48-93] 93 (06/27 0347) Resp:  [12-20] 18 (06/27 0347) BP: (70-164)/(31-139) 118/51 mmHg (06/27 0347) SpO2:  [97 %-100 %] 97 % (06/27 0347) Weight:  [74.39 kg (164 lb)-78.971 kg (174 lb 1.6 oz)] 78.971 kg (174 lb 1.6 oz) (06/26 1656) Heels are non tender and elevated off the bed using rolled towels Complains of a lot of stiffness to the leg. Intake/Output from previous day: 06/26 0701 - 06/27 0700 In: 2955 [I.V.:2025; Blood:330; IV Piggyback:600] Out: 1955 [Urine:1205; Drains:700; Blood:50] Intake/Output this shift:     Recent Labs  09/15/15 0317  HGB 9.3*    Recent Labs  09/15/15 0317  WBC 11.3*  RBC 3.11*  HCT 26.8*  PLT 203    Recent Labs  09/15/15 0317  NA 136  K 3.2*  CL 106  CO2 25  BUN 9  CREATININE 0.60  GLUCOSE 111*  CALCIUM 8.3*   No results for input(s): LABPT, INR in the last 72 hours.  EXAM General - Patient is Alert, Appropriate and Oriented Extremity - Neurologically intact Neurovascular intact Sensation intact distally Intact pulses distally Dorsiflexion/Plantar flexion intact Compartment soft Dressing - dressing C/D/I Motor Function - intact, moving foot and toes well on exam.    Past Medical History  Diagnosis Date  . Hypertension   . Cancer ([place])   . Breast cancer ([place])     left breast cancer  . Left bundle branch block (LBBB)   . Hyperlipidemia   . Anxiety   . GERD (gastroesophageal reflux  disease)   . Arthritis   . Neuropathy ([facility])     Assessment/Plan: 1 Day Post-Op Procedure(s) (LRB): COMPUTER ASSISTED TOTAL KNEE ARTHROPLASTY (Right) Active Problems:   S/P total knee arthroplasty  Estimated body mass index is 28.11 kg/(m^2) as calculated from the following:   Height as of this encounter: 5\' 6"  (1.676 m).   Weight as of this encounter: 78.971 kg (174 lb 1.6 oz). Advance diet Up with therapy D/C IV fluids Plan for discharge tomorrow Discharge to SNF  Labs: reviewed K+ 3.2 DVT Prophylaxis - Lovenox, Foot Pumps and TED hose Weight-Bearing as tolerated to right leg D/C O2 and Pulse OX and try on Room Air Begin working on a bowel movement Labs tomorrow am  Carly Peck. Carly Peck 09/15/2015, 7:18 AM

## 2015-09-15 NOTE — Care Management (Signed)
Met briefly with patient and her husband Bob to discuss discharge planning. Patient plans for SNF rehab at [facility] at discharge. She uses [facility] [address] [address] for Rx. RNCM will continue to follow.  

## 2015-09-15 NOTE — Evaluation (Signed)
Occupational Therapy Evaluation Patient Details Name: Carly Peck MRN: [id number] DOB: 1931/10/15 Today's Date: 09/15/2015    History of Present Illness Pt. was admitted to [facility] for a Right TKR secondary to degenerative arthrosis of the right knee.   Clinical Impression   Pt. Is an 82 y.o. female who was admitted for a right TKR. Pt presents with limited ROM, Pain, weakness, and impaired functional mobility which hinder her ability to complete ADL and IADL tasks. Pt. could benefit from skilled OT services to review A/E use for LE ADLs, to review necessary home modifications, and to improve functional mobility for ADL/IADLs in order to work towards regaining Independence with ADL/IADLs.     Follow Up Recommendations  Home health OT    Equipment Recommendations       Recommendations for Other Services PT consult     Precautions / Restrictions Precautions Required Braces or Orthoses: Knee Immobilizer - Right Restrictions Weight Bearing Restrictions: Yes RLE Weight Bearing: Weight bearing as tolerated                                                                                           ADL Overall ADL's : Needs assistance/impaired                                       General ADL Comments: Pt. requires maxA for all LE ADLs secondary to pain, limited ROM, and mobility. Pt. And husband were educated about A/E use for LE dressing.     Vision     Perception     Praxis      Pertinent Vitals/Pain Pain Assessment: 0-10 Pain Score: 10-Worst pain ever Pain Intervention(s): Limited activity within patient's tolerance;Patient requesting pain meds-RN notified;RN gave pain meds during session;Repositioned     Hand Dominance     Extremity/Trunk Assessment Upper Extremity Assessment Upper Extremity Assessment: Overall WFL for tasks assessed           Communication     Cognition                           General Comments       Exercises       Shoulder Instructions      Home Living                                          Prior Functioning/Environment               OT Diagnosis: Generalized weakness;Acute pain   OT Problem List: Decreased strength;Decreased range of motion;Pain;Decreased knowledge of use of DME or AE   OT Treatment/Interventions: Self-care/ADL training;Therapeutic activities;Therapeutic exercise;Patient/family education;DME and/or AE instruction    OT Goals(Current goals can be found in the care plan section) Acute Rehab OT Goals Patient Stated Goal: To regain independence OT Goal Formulation: With patient Potential to Achieve Goals: Good  OT Frequency: Min 1X/week  Barriers to D/C:            Co-evaluation              End of Session    Activity Tolerance: Patient tolerated treatment well Patient left: in chair;with call bell/phone within reach;with chair alarm set;with family/visitor present   Time: [id number] OT Time Calculation (min): 20 min Charges:  OT General Charges $OT Visit: 1 Procedure OT Evaluation $OT Eval Moderate Complexity: 1 Procedure G-Codes:    Harrel Carina, MS, OTR/L Harrel Carina 09/15/2015, 10:37 AM

## 2015-09-15 NOTE — Progress Notes (Signed)
Physical Therapy Evaluation Patient Details Name: Carly Peck MRN: [id number] DOB: 01-Jun-1931 Today's Date: 09/15/2015   History of Present Illness  Pt is a 82 yr old female s/p elective R TKA 09/14/15. PMH significant for L breast CA, HTN, anxiety, neuropathy, and hyperlipidemia.     Clinical Impression  Prior to admission, pt was independent with all functional mobility and ADLs.  Pt lives with her husband in a [place] condo with level entry.  Currently, pt is min A for bed mobility, mod A for sit <> stand transfers, and min A for ambulation x21ft with RW.  Pt demonstrates ability to perform SLR, thus does not require R KI at this time. Pt's independence is limited by decreased R knee ROM, pain, and limited activity tolerance. Pt would benefit from skilled PT to address noted impairments and functional limitations.  Recommend pt discharge to SNF when medically appropriate.     Follow Up Recommendations SNF    Equipment Recommendations   (has RW at home)    Recommendations for Other Services       Precautions / Restrictions Precautions Precautions: Fall;Knee Precaution Booklet Issued: Yes (comment)  Restrictions Weight Bearing Restrictions: Yes RLE Weight Bearing: Weight bearing as tolerated      Mobility  Bed Mobility Overal bed mobility: Needs Assistance Bed Mobility: Supine to Sit  Supine to sit: Min assist;HOB elevated    General bed mobility comments: With [facility] elevated, pt able to achieve sitting EOB with increased time, use of bed rails, and min A for RLE management. Pt reports bilat rotator cuff damage, limiting ability to participate in bed mobility with UEs.   Transfers Overall transfer level: Needs assistance Equipment used: Rolling walker (2 wheeled) Transfers: Sit to/from Stand Sit to Stand: Mod assist    General transfer comment: First attempt pt unable to assume full stand and returned sitting EOB. With increased education/cueing, pt able to assume full  stand on second attempt with mod A. Decreased WB'ing through RLE. Vc's for hand/foot placement and sequencing.  Ambulation/Gait Ambulation/Gait assistance: Min assist Ambulation Distance (Feet): 3 Feet Assistive device: Rolling walker (2 wheeled) Gait Pattern/deviations: Antalgic;Decreased stance time - right;Decreased step length - right;Decreased step length - left Gait velocity: decreased   General Gait Details: Took several steps forward and backward in order to transfer bed > chair with RW and min A. VC's for hand/foot placement and sequencing. Significant pain (10/10) in R knee during R stance.  Stairs     Wheelchair Mobility    Modified Rankin (Stroke Patients Only)       Balance Overall balance assessment: Needs assistance Sitting-balance support: Bilateral upper extremity supported;Feet supported Sitting balance-Leahy Scale: Good Sitting balance - Comments: Maintains sitting EOB with close standby assist.   Standing balance support: Bilateral upper extremity supported (on RW) Standing balance-Leahy Scale: Good Standing balance comment: Pt able to take several steps EOB with RW and min A.      Pertinent Vitals/Pain Pain Assessment: 0-10 Pain Score: 10-Worst pain ever Pain Location: R knee (following transfer to chair) Pain Intervention(s): Limited activity within patient's tolerance;Monitored during session;Repositioned;Patient requesting pain meds-RN notified;Ice applied  Vitals monitored and remained WFL during session.     Home Living Family/patient expects to be discharged to:: Private residence Living Arrangements: Spouse/significant other Type of Home: House Home Access: Level entry  Home Layout: One [place]: [place] - 2 wheels Additional Comments: reports spouse with decreased capacity for physical assist    Prior Function Level of Independence:  Independent          Extremity/Trunk Assessment   Upper Extremity Assessment: Defer to OT  evaluation    Lower Extremity Assessment: [facility] for tasks assessed  RLE Deficits / Details: At least 3-/5; unable to resist secondary to pain    Cervical / Trunk Assessment: Normal    Communication   Communication: No difficulties  Cognition Arousal/Alertness: Awake/alert Behavior During Therapy: WFL for tasks assessed/performed Overall Cognitive Status: Within Functional Limits for tasks assessed     General Comments General comments (skin integrity, edema, etc.): RLE ace wrapped; hemovac and polar care in place   Nursing cleared pt for participation in physical therapy.  Pt agreeable to PT session.     Exercises Total Joint Exercises Ankle Circles/Pumps: AROM;Strengthening;Both;10 reps;Supine Quad Sets: AROM;Strengthening;Both;10 reps;Supine Short Arc Quad: AROM;Strengthening;Both;10 reps;Supine Heel Slides: AROM;Strengthening;Both;10 reps;Supine (AROM LLE; AAROM for increased range RLE) Hip ABduction/ADduction: AROM;AAROM;Strengthening;Both;10 reps;Supine (AROM LLE; AAROM for increased range RLE) Straight Leg Raises: AROM;AAROM;Strengthening;Both;10 reps;Supine (AROM LLE; AAROM for increased range RLE) Goniometric ROM: Supine R knee extension lacks 12 degrees from neutral. Seated R knee flexion 64 degrees.      Assessment/Plan    PT Assessment Patient needs continued PT services  PT Diagnosis Difficulty walking;Acute pain   PT Problem List Decreased activity tolerance;Decreased balance;Decreased mobility;Decreased knowledge of use of DME;Decreased range of motion  PT Treatment Interventions DME instruction;Gait training;Functional mobility training;Therapeutic activities;Therapeutic exercise;Balance training;Patient/family education   PT Goals (Current goals can be found in the Care Plan section) Acute Rehab PT Goals Patient Stated Goal: To regain independence PT Goal Formulation: With patient Time For Goal Achievement: 09/29/15 Potential to Achieve Goals: Good     Frequency BID   Barriers to discharge Decreased caregiver support pt reports her spouse has decreased capacity to provide assist PRN       End of Session Equipment Utilized During Treatment: Gait belt Activity Tolerance: Patient limited by pain Patient left: in chair;with call bell/phone within reach;with chair alarm set;with family/visitor present;with SCD's reapplied (bilat heels elevated on towel rolls; polar care on) Nurse Communication: Mobility status;Patient requests pain meds;Precautions;Weight bearing status         Time: 0906-0950 PT Time Calculation (min) (ACUTE ONLY): 44 min   Charges:         PT G Codes:        Shaquanda Graves, SPT 09/15/2015, 1:14 PM

## 2015-09-15 NOTE — Anesthesia Postprocedure Evaluation (Signed)
Anesthesia Post Note  Patient: Carly Peck  Procedure(s) Performed: Procedure(s) (LRB): COMPUTER ASSISTED TOTAL KNEE ARTHROPLASTY (Right)  Patient location during evaluation: PACU Anesthesia Type: Spinal Level of consciousness: awake Pain management: pain level controlled Vital Signs Assessment: post-procedure vital signs reviewed and stable Respiratory status: spontaneous breathing Cardiovascular status: stable    Last Vitals:  Filed Vitals:   09/14/15 2223 09/15/15 0347  BP: 116/47 118/51  Pulse: 85 93  Temp: 36.8 C 37 C  Resp: 18 18    Last Pain:  Filed Vitals:   09/15/15 0505  PainSc: Asleep                 VAN STAVEREN,Teryl Gubler

## 2015-09-15 NOTE — Progress Notes (Signed)
Patient resting in bed. No complaints at this time. Bone foam requested from supplies. Patient states she has a walker given to her by a friend in her car if needed. Patient states if she has to go to rehab, she would like [facility]. Husband at bedside. Continue to monitor.

## 2015-09-15 NOTE — Clinical Social Work Note (Signed)
Clinical Social Work Assessment  Patient Details  Name: Carly Peck MRN: [id number] Date of Birth: September 17, 1931  Date of referral:  09/15/15               Reason for consult:  Facility Placement                Permission sought to share information with:  Chartered certified accountant granted to share information::  Yes, Verbal Permission Granted  Name::      Carly Peck::   Carly Peck   Relationship::     Contact Information:     Housing/Transportation Living arrangements for the past 2 months:  [place] of Information:  Patient, Spouse Patient Interpreter Needed:  None Criminal Activity/Legal Involvement Pertinent to Current Situation/Hospitalization:  No - Comment as needed Significant Relationships:  Adult Children, Spouse Lives with:  Spouse Do you feel safe going back to the place where you live?  Yes Need for family participation in patient care:  Yes (Comment)  Care giving concerns:  Patient lives in [place] with her husband Carly Peck.    Social Worker assessment / plan:  Holiday representative (CSW) received SNF consult. PT is recommending SNF. CSW met with patient and her husband Carly Peck was at bedside. Patient was alert and oriented and was sitting up in the chair. CSW introduced self and explained role of CSW department. Patient reported that she lives in [place] with her husband and has 1 adult daughter. Per patient she had breast cancer 20 years ago and has been finished with treatment for years. CSW explained that PT is recommending SNF. Patient is agreeable to SNF search and prefers [facility].   CSW presented bed offers and patient chose [facility]. [facility] admissions coordinator at [facility] is aware of accepted bed offer. CSW will continue to follow and assist as needed.   Employment status:  Retired Nurse, adult PT Recommendations:  [place] /  Referral to community resources:  [place]  Patient/Family's Response to care:  Patient and husband are agreeable for patient to go to [facility].   Patient/Family's Understanding of and Emotional Response to Diagnosis, Current Treatment, and Prognosis:  Patient and husband were pleasant and thanked CSW for visit.   Emotional Assessment Appearance:  Appears stated age Attitude/Demeanor/Rapport:    Affect (typically observed):  Accepting, Adaptable, Pleasant Orientation:  Oriented to Self, Oriented to Place, Oriented to  Time, Oriented to Situation Alcohol / Substance use:  Not Applicable Psych involvement (Current and /or in the community):  No (Comment)  Discharge Needs  Concerns to be addressed:  Discharge Planning Concerns Readmission within the last 30 days:  No Current discharge risk:  Dependent with Mobility Barriers to Discharge:  Continued Medical Work up   Carly Fresh[place], LCSW 09/15/2015, 10:38 AM

## 2015-09-15 NOTE — Progress Notes (Signed)
Physical Therapy Treatment Patient Details Name: Carly Peck MRN: [id number] DOB: 1932-02-19 Today's Date: 09/15/2015    History of Present Illness Pt is a 82 yr old female s/p elective R TKA 09/14/15. PMH significant for L breast CA, HTN, anxiety, neuropathy, and hyperlipidemia.     PT Comments    Pt presented in chair anxiously awaiting pain meds and returning to bed. Nsg notified and pain neds received during session.  Pt required add'l time and cues for sit to stand transfer which was done with modA x1 and was able to take several steps to bed and add'l side steps for positioning. Pt required encouragement to place wt on RLE but was able to improve successfully C/o nausea limiting ambulation distance.  Pt returned to bed requiring mod Ax1 for sit to supine transfer with assist for RLE placement.  Pt able to continue with supine A/AA therex with good tolerance.   Follow Up Recommendations  SNF     Equipment Recommendations   (has RW at home)    Recommendations for Other Services       Precautions / Restrictions Precautions Precautions: Fall;Knee Precaution Booklet Issued: No Restrictions Weight Bearing Restrictions: Yes RLE Weight Bearing: Weight bearing as tolerated    Mobility  Bed Mobility Overal bed mobility: Needs Assistance Bed Mobility: Sit to Supine     Supine to sit: Min assist;HOB elevated Sit to supine: Mod assist   General bed mobility comments: HOB elevated, PTA assist for RLE placement   Transfers Overall transfer level: Needs assistance Equipment used: Rolling walker (2 wheeled) Transfers: Sit to/from Stand Sit to Stand: Mod assist         General transfer comment: Cues for PHP and sequencing, add'l time required   Ambulation/Gait Ambulation/Gait assistance: Min assist Ambulation Distance (Feet): 5 Feet Assistive device: Rolling walker (2 wheeled) Gait Pattern/deviations: Step-to pattern;Decreased step length - right;Decreased stance time -  right;Decreased weight shift to right;Antalgic Gait velocity: decreased   General Gait Details: Moderate use of UE on RW, c/o increased nausea with steps    Stairs            Wheelchair Mobility    Modified Rankin (Stroke Patients Only)       Balance Overall balance assessment: Needs assistance Sitting-balance support: Feet supported Sitting balance-Leahy Scale: Good Sitting balance - Comments: Maintains sitting EOB with close standby assist.   Standing balance support: Bilateral upper extremity supported Standing balance-Leahy Scale: Fair Standing balance comment: Pt able to take several steps EOB with CGA for safety.                    Cognition Arousal/Alertness: Awake/alert Behavior During Therapy: WFL for tasks assessed/performed Overall Cognitive Status: Within Functional Limits for tasks assessed                      Exercises Total Joint Exercises Ankle Circles/Pumps: AROM;Both;10 reps;Seated Quad Sets: AROM;Both;10 reps;Seated (Long sit ) Gluteal Sets: AROM;Both;10 reps;Seated Short Arc Quad: AROM;Right;10 reps;Supine Heel Slides: AAROM;Right;10 reps;Supine Hip ABduction/ADduction: AROM;Right;10 reps;Supine Straight Leg Raises: AROM;Right;10 reps;Supine Goniometric ROM: Supine R knee extension lacks 12 degrees from neutral. Seated R knee flexion 64 degrees.    General Comments General comments (skin integrity, edema, etc.): Ace bandage on R knee holding hemovac and cryocuff in place       Pertinent Vitals/Pain Pain Assessment: 0-10 Pain Score: 10-Worst pain ever Pain Location: R knee Pain Descriptors / Indicators: Operative site guarding Pain Intervention(s): Limited  activity within patient's tolerance;Monitored during session;RN gave pain meds during session    [place] expects to be discharged to:: Private residence Living Arrangements: Spouse/significant other   Type of Home: House Home Access: Level entry    [place]: One [place]: Environmental consultant - 2 wheels Additional Comments: spouse with decreased capacity for physical assist    Prior Function Level of Independence: Independent          PT Goals (current goals can now be found in the care plan section) Acute Rehab PT Goals Patient Stated Goal: To regain independence PT Goal Formulation: With patient Time For Goal Achievement: 09/29/15 Potential to Achieve Goals: Good Additional Goals Additional Goal #1: Pt will demonstrate R knee ROM at least 0-90 degrees.    Frequency  BID    PT Plan      Co-evaluation             End of Session Equipment Utilized During Treatment: Gait belt Activity Tolerance: Patient tolerated treatment well;Patient limited by pain Patient left: in bed;with call bell/phone within reach;with bed alarm set;with family/visitor present     Time: [id number] PT Time Calculation (min) (ACUTE ONLY): 32 min  Charges:  $Therapeutic Exercise: 8-22 mins $Therapeutic Activity: 8-22 mins                    G Codes:      Yaminah Clayborn Oct 05, 2015, 2:48 PM Krysta Bloomfield, PTA

## 2015-09-15 NOTE — NC FL2 (Signed)
[place] LEVEL OF CARE SCREENING TOOL     IDENTIFICATION  Patient Name: Carly Peck Birthdate: 06/05/1931 Sex: female Admission Date (Current Location): 09/14/2015  [place] and [place] Number:  Engineering geologist and Address:  [facility], [address], [place], [place] [address]      Provider Number: [id number]  Attending Physician Name and Address:  Dereck Leep, MD  Relative Name and Phone Number:       Current Level of Care: Hospital Recommended Level of Care: [place] Prior Approval Number:    Date Approved/Denied:   PASRR Number:  ([id number])  Discharge Plan: SNF    Current Diagnoses: Patient Active Problem List   Diagnosis Date Noted  . S/P total knee arthroplasty 09/14/2015  . Leg pain 04/07/2015  . Numbness and tingling 04/06/2015  . Benign essential HTN 02/24/2015  . Combined fat and carbohydrate induced hyperlipemia 02/24/2015  . Edema leg 02/09/2015  . Block, bundle branch, left 02/09/2015  . Age related osteoporosis 06/17/2014  . Macroglobulinemia of Waldenstrom ([place]) 06/17/2014  . Waldenstrom's macroglobulinemia ([place]) 06/17/2014  . Generalized OA 11/30/2013  . HLD (hyperlipidemia) 11/30/2013  . Benign hypertension 11/30/2013  . Headache, migraine 11/30/2013  . Arthritis, degenerative 08/27/2013    Orientation RESPIRATION BLADDER Height & Weight     Self, Time, Situation, Place  Normal Continent Weight: 174 lb 1.6 oz (78.971 kg) Height:  5\' 6"  (167.6 cm)  BEHAVIORAL SYMPTOMS/MOOD NEUROLOGICAL BOWEL NUTRITION STATUS   (none )  (none ) Continent Diet (Diet: Regular )  AMBULATORY STATUS COMMUNICATION OF NEEDS Skin   Extensive Assist Verbally Surgical wounds (Incision: Right Knee )                       Personal Care Assistance Level of Assistance  Bathing, Feeding, Dressing Bathing Assistance: Limited assistance Feeding assistance: Independent Dressing Assistance: Limited  assistance     Functional Limitations Info  Sight, Hearing, Speech Sight Info: Adequate Hearing Info: Adequate Speech Info: Adequate    SPECIAL CARE FACTORS FREQUENCY  PT (By licensed PT), OT (By licensed OT)     PT Frequency:  (5) OT Frequency:  (5)            Contractures      Additional Factors Info  Code Status, Allergies Code Status Info:  (Full Code. ) Allergies Info:  (Adhesive, Ultram)           Current Medications (09/15/2015):  This is the current hospital active medication list Current Facility-Administered Medications  Medication Dose Route Frequency Provider Last Rate Last Dose  . 0.9 %  sodium chloride infusion   Intravenous Continuous Dereck Leep, MD 100 mL/hr at 09/15/15 0403    . acetaminophen (OFIRMEV) IV 1,000 mg  1,000 mg Intravenous Q6H Dereck Leep, MD   1,000 mg at 09/15/15 [id number]  . acetaminophen (TYLENOL) tablet 650 mg  650 mg Oral Q6H PRN Dereck Leep, MD       Or  . acetaminophen (TYLENOL) suppository 650 mg  650 mg Rectal Q6H PRN Dereck Leep, MD      . alum & mag hydroxide-simeth (MAALOX/MYLANTA) 200-200-20 MG/5ML suspension 30 mL  30 mL Oral Q4H PRN Dereck Leep, MD      . bisacodyl (DULCOLAX) suppository 10 mg  10 mg Rectal Daily PRN Dereck Leep, MD      . carvedilol (COREG) tablet 3.125 mg  3.125 mg Oral BID  Dereck Leep, MD   3.125 mg at 09/14/15 2110  . ceFAZolin (ANCEF) IVPB 2g/100 mL premix  2 g Intravenous Q6H Dereck Leep, MD   2 g at 09/15/15 0405  . celecoxib (CELEBREX) capsule 200 mg  200 mg Oral Daily Dereck Leep, MD   200 mg at 09/14/15 1811  . cholecalciferol (VITAMIN D) tablet 2,000 Units  2,000 Units Oral Daily Dereck Leep, MD   2,000 Units at 09/14/15 1811  . clobetasol ointment (TEMOVATE) 0.05 %   Topical BID PRN Dereck Leep, MD      . diphenhydrAMINE (BENADRYL) 12.5 MG/5ML elixir 12.5-25 mg  12.5-25 mg Oral Q4H PRN Dereck Leep, MD      . enoxaparin (LOVENOX) injection 30 mg  30 mg Subcutaneous  Q12H Dereck Leep, MD      . ferrous sulfate tablet 325 mg  325 mg Oral BID WC Dereck Leep, MD   325 mg at 09/14/15 1810  . fluticasone (FLONASE) 50 MCG/ACT nasal spray 2 spray  2 spray Each Nare BID PRN Dereck Leep, MD      . gabapentin (NEURONTIN) capsule 200 mg  200 mg Oral BID Dereck Leep, MD   200 mg at 09/14/15 2109  . loratadine (CLARITIN) tablet 10 mg  10 mg Oral Daily Dereck Leep, MD   10 mg at 09/14/15 1810  . losartan (COZAAR) tablet 100 mg  100 mg Oral Daily Dereck Leep, MD      . magnesium hydroxide (MILK OF MAGNESIA) suspension 30 mL  30 mL Oral Daily PRN Dereck Leep, MD      . menthol-cetylpyridinium (CEPACOL) lozenge 3 mg  1 lozenge Oral PRN Dereck Leep, MD       Or  . phenol (CHLORASEPTIC) mouth spray 1 spray  1 spray Mouth/Throat PRN Dereck Leep, MD      . metoCLOPramide (REGLAN) tablet 10 mg  10 mg Oral TID AC & HS Dereck Leep, MD   10 mg at 09/14/15 2109  . morphine 2 MG/ML injection 2 mg  2 mg Intravenous Q2H PRN Dereck Leep, MD   2 mg at 09/14/15 1930  . ondansetron (ZOFRAN) tablet 4 mg  4 mg Oral Q6H PRN Dereck Leep, MD       Or  . ondansetron (ZOFRAN) injection 4 mg  4 mg Intravenous Q6H PRN Dereck Leep, MD      . oxyCODONE (Oxy IR/ROXICODONE) immediate release tablet 5-10 mg  5-10 mg Oral Q4H PRN Dereck Leep, MD   10 mg at 09/15/15 0405  . pantoprazole (PROTONIX) EC tablet 40 mg  40 mg Oral BID Dereck Leep, MD   40 mg at 09/14/15 2109  . potassium chloride (KLOR-CON) packet 40 mEq  40 mEq Oral BID Dereck Leep, MD      . senna-docusate (Senokot-S) tablet 1 tablet  1 tablet Oral BID Dereck Leep, MD   1 tablet at 09/14/15 2109  . simvastatin (ZOCOR) tablet 40 mg  40 mg Oral QHS Dereck Leep, MD   40 mg at 09/14/15 2109  . sodium phosphate (FLEET) 7-19 GM/118ML enema 1 enema  1 enema Rectal Once PRN Dereck Leep, MD      . torsemide [facility]) tablet 2.5 mg  2.5 mg Oral BH-q7a Dereck Leep, MD         Discharge  Medications: Please see discharge summary for a  list of discharge medications.  Relevant Imaging Results:  Relevant Lab Results:   Additional Information  (SSN: [id number])  Loralyn Freshwater, LCSW

## 2015-09-15 NOTE — Discharge Summary (Signed)
Physician Discharge Summary  Patient ID: Carly Peck MRN: [id number] DOB/AGE: 1931-11-02 82 y.o.  Admit date: 09/14/2015 Discharge date: 09/17/2015    Admission Diagnoses:  OSTEOARTHRITIS   Discharge Diagnoses: Patient Active Problem List   Diagnosis Date Noted  . S/P total knee arthroplasty 09/14/2015  . Leg pain 04/07/2015  . Numbness and tingling 04/06/2015  . Benign essential HTN 02/24/2015  . Combined fat and carbohydrate induced hyperlipemia 02/24/2015  . Edema leg 02/09/2015  . Block, bundle branch, left 02/09/2015  . Age related osteoporosis 06/17/2014  . Macroglobulinemia of Waldenstrom ([place]) 06/17/2014  . Waldenstrom's macroglobulinemia ([place]) 06/17/2014  . Generalized OA 11/30/2013  . HLD (hyperlipidemia) 11/30/2013  . Benign hypertension 11/30/2013  . Headache, migraine 11/30/2013  . Arthritis, degenerative 08/27/2013    Past Medical History  Diagnosis Date  . Hypertension   . Cancer ([place])   . Breast cancer ([place])     left breast cancer  . Left bundle branch block (LBBB)   . Hyperlipidemia   . Anxiety   . GERD (gastroesophageal reflux disease)   . Arthritis   . Neuropathy ([place])      Transfusion: Autovac transfusion given during the first 6 hours post op   Consultants (if any):  Case managemet for home health assistance  Discharged Condition: Improved  Hospital Course: Carly Peck is an 82 y.o. female who was admitted 09/14/2015 with a diagnosis of degenerative arthrosis of right knee and went to the operating room on 09/14/2015 and underwent the above named procedures.    Surgeries:Procedure(s): COMPUTER ASSISTED TOTAL KNEE ARTHROPLASTY on 09/14/2015  PRE-OPERATIVE DIAGNOSIS: Degenerative arthrosis of the right knee, primary  POST-OPERATIVE DIAGNOSIS: Same  PROCEDURE: Right total knee arthroplasty using computer-assisted navigation  SURGEON: Marciano Sequin. M.D.  ASSISTANT: Vance Peper, PA (present and scrubbed throughout the case,  critical for assistance with exposure, retraction, instrumentation, and closure)  ANESTHESIA: spinal  ESTIMATED BLOOD LOSS: 50 mL  FLUIDS REPLACED: 1000 mL of crystalloid  TOURNIQUET TIME: 90 minutes  DRAINS: 2 medium drains to a reinfusion system  SOFT TISSUE RELEASES: Anterior cruciate ligament, posterior cruciate ligament, deep medial collateral ligament, patellofemoral ligament   IMPLANTS UTILIZED: DePuy Attune size 4 posterior stabilized femoral component (cemented), size 5 rotating platform tibial component (cemented), 35 mm medialized dome patella (cemented), and a 5 mm stabilized rotating platform polyethylene insert.  INDICATIONS FOR SURGERY: Carly Peck is a 82 y.o. year old female with a long history of progressive knee pain. X-rays demonstrated severe degenerative changes in tricompartmental fashion. The patient had not seen any significant improvement despite conservative nonsurgical intervention. After discussion of the risks and benefits of surgical intervention, the patient expressed understanding of the risks benefits and agree with plans for total knee arthroplasty.   The risks, benefits, and alternatives were discussed at length including but not limited to the risks of infection, bleeding, nerve injury, stiffness, blood clots, the need for revision surgery, cardiopulmonary complications, among others, and they were willing to proceed. Patient tolerated the surgery well. No complications .Patient was taken to PACU where she was stabilized and then transferred to the orthopedic floor.  Patient started on Lovenox 30q 12hrs. Foot pumps applied bilaterally at 80 mm hg. Heels elevated off bed with rolled towels. No evidence of DVT. Calves non tender. Negative [place]. Physical therapy started on day #1 for gait training and transfer with OT starting on  day #1 for ADL and assisted devices. Patient has done well with therapy. Ambulated 15 feet  upon being discharged.  Patient's IV  and foley were discontinued on day #1 with the hemovac being discontinued on day #2. Dressing changed on day #2.   She was given perioperative antibiotics:  Anti-infectives    Start     Dose/Rate Route Frequency Ordered Stop   09/14/15 1645  ceFAZolin (ANCEF) IVPB 2g/100 mL premix     2 g 200 mL/hr over 30 Minutes Intravenous Every 6 hours 09/14/15 1630 09/15/15 0921   09/14/15 0824  ceFAZolin (ANCEF) 2-4 GM/100ML-% IVPB    Comments:  Idamae Lusher: cabinet override      09/14/15 0824 09/14/15 1158   09/14/15 0021  ceFAZolin (ANCEF) IVPB 2g/100 mL premix  Status:  Discontinued     2 g 200 mL/hr over 30 Minutes Intravenous On call to O.R. 09/14/15 0021 09/14/15 [id number]    .  She was fitted with AV 1 compression foot pump devices, instructed in heel pump, early ambulation, and fitted with TED stockings bilaterally for DVT prophylaxis.  She benefited maximally from the hospital stay and there were no complications.    Recent vital signs:  Filed Vitals:   09/15/15 1501 09/15/15 1922  BP: 123/50 122/48  Pulse: 88 91  Temp:  97.9 F (36.6 C)  Resp: 18 18    Recent laboratory studies:  Lab Results  Component Value Date   HGB 9.3* 09/15/2015   HGB 11.4* 09/02/2015   HGB 11.6* 05/05/2015   Lab Results  Component Value Date   WBC 11.3* 09/15/2015   PLT 203 09/15/2015   Lab Results  Component Value Date   INR 1.14 09/02/2015   Lab Results  Component Value Date   NA 136 09/15/2015   K 3.2* 09/15/2015   CL 106 09/15/2015   CO2 25 09/15/2015   BUN 9 09/15/2015   CREATININE 0.60 09/15/2015   GLUCOSE 111* 09/15/2015    Discharge Medications:     Medication List    TAKE these medications        acetaminophen 500 MG tablet  Commonly known as:  TYLENOL  Take 1,000 mg by mouth 2 (two) times daily as needed for mild pain or headache.     BENEFIBER DRINK MIX PO  Take 15 mg by mouth every morning.     carvedilol 3.125 MG tablet  Commonly known as:  COREG  Take 3.125 mg  by mouth 2 (two) times daily.     celecoxib 200 MG capsule  Commonly known as:  CELEBREX  Take 200 mg by mouth daily.     cetirizine 10 MG tablet  Commonly known as:  ZYRTEC  Take 10 mg by mouth daily.     cholecalciferol 1000 units tablet  Commonly known as:  VITAMIN D  Take 2,000 Units by mouth daily.     clobetasol ointment 0.05 %  Commonly known as:  TEMOVATE  APPLY TO AFFECTED AREA DAILY AS NEEDED for rash on buttocks     enoxaparin 30 MG/0.3ML injection  Commonly known as:  LOVENOX  Inject 0.3 mLs (30 mg total) into the skin every 12 (twelve) hours.     fluticasone 50 MCG/ACT nasal spray  Commonly known as:  FLONASE  Place 2 sprays into both nostrils 2 (two) times daily as needed for rhinitis.     gabapentin 100 MG capsule  Commonly known as:  NEURONTIN  Take 3 capsules (300 mg total) by mouth 3 (three) times daily.     losartan 100 MG tablet  Commonly known as:  COZAAR  Take 100 mg by mouth daily.     omeprazole 20 MG capsule  Commonly known as:  PRILOSEC  Take 20 mg by mouth daily.     oxyCODONE 5 MG immediate release tablet  Commonly known as:  Oxy IR/ROXICODONE  Take 1-2 tablets (5-10 mg total) by mouth every 4 (four) hours as needed for severe pain or breakthrough pain.     sennosides-docusate sodium 8.6-50 MG tablet  Commonly known as:  SENOKOT-S  Take 3-4 tablets by mouth at bedtime. May take 4, if needed     simvastatin 40 MG tablet  Commonly known as:  ZOCOR  Take 40 mg by mouth at bedtime.     torsemide 5 MG tablet  Commonly known as:  DEMADEX  Take 2.5 mg by mouth every morning.        Diagnostic Studies: Dg Knee Right Port  09/14/2015  CLINICAL DATA:  Postop right total knee arthroplasty. EXAM: PORTABLE RIGHT KNEE - 1-2 VIEW COMPARISON:  06/07/2013 FINDINGS: Evidence of patient's recent right total knee arthroplasty with prosthetic components normally located and intact. Skin staples vertically over the anterior soft tissues. Two surgical  drains in place. IMPRESSION: Expected postoperative changes post total knee arthroplasty. Electronically Signed   By: Marin Olp M.D.   On: 09/14/2015 15:44    Disposition: 01-Home or Self Care      Discharge Instructions    Diet - low sodium heart healthy    Complete by:  As directed      Increase activity slowly    Complete by:  As directed            Follow-up Information    Follow up with [facility] On 09/29/2015.   Specialty:  Physician Assistant   Why:  At 9:45 AM   Contact information:   [address] [facility] [place] [address] [phone]       Follow up with Dereck Leep, MD On 10/22/2015.   Specialty:  Orthopedic Surgery   Why:  At 1:30 PM   Contact information:   [place] [address] [phone]       Follow up with [facility] .   Specialties:  [place], [place]   Contact information:   [place] [phone]       Signed: Watt Climes. 09/15/2015, 8:21 PM

## 2015-09-15 NOTE — Clinical Social Work Placement (Signed)
   CLINICAL SOCIAL WORK PLACEMENT  NOTE  Date:  09/15/2015  Patient Details  Name: Carly Peck MRN: [id number] Date of Birth: 09-23-1931  Clinical Social Work is seeking post-discharge placement for this patient at the [place] level of care (*CSW will initial, date and re-position this form in  chart as items are completed):  Yes   Patient/family provided with [place] Work Department's list of facilities offering this level of care within the geographic area requested by the patient (or if unable, by the patient's family).  Yes   Patient/family informed of their freedom to choose among providers that offer the needed level of care, that participate in Medicare, Medicaid or managed care program needed by the patient, have an available bed and are willing to accept the patient.  Yes   Patient/family informed of [place]'s ownership interest in [facility] and [facility], as well as of the fact that they are under no obligation to receive care at these facilities.  PASRR submitted to EDS on 09/15/15     PASRR number received on 09/15/15     Existing PASRR number confirmed on       FL2 transmitted to all facilities in geographic area requested by pt/family on 09/15/15     FL2 transmitted to all facilities within larger geographic area on       Patient informed that his/her managed care company has contracts with or will negotiate with certain facilities, including the following:        Yes   Patient/family informed of bed offers received.  Patient chooses bed at  [facility] )     Physician recommends and patient chooses bed at      Patient to be transferred to   on  .  Patient to be transferred to facility by       Patient family notified on   of transfer.  Name of family member notified:        PHYSICIAN       Additional Comment:    _______________________________________________ Loralyn Freshwater, LCSW 09/15/2015,  10:38 AM

## 2015-09-15 NOTE — Discharge Instructions (Signed)

## 2015-09-15 NOTE — Anesthesia Postprocedure Evaluation (Signed)
Anesthesia Post Note  Patient: Carly Peck  Procedure(s) Performed: Procedure(s) (LRB): COMPUTER ASSISTED TOTAL KNEE ARTHROPLASTY (Right)  Patient location during evaluation: Nursing Unit Anesthesia Type: Spinal Level of consciousness: awake, awake and alert and oriented Pain management: pain level controlled Vital Signs Assessment: post-procedure vital signs reviewed and stable Respiratory status: spontaneous breathing and respiratory function stable Cardiovascular status: blood pressure returned to baseline Postop Assessment: patient able to bend at knees, no signs of nausea or vomiting and adequate PO intake Anesthetic complications: no    Last Vitals:  Filed Vitals:   09/14/15 2223 09/15/15 0347  BP: 116/47 118/51  Pulse: 85 93  Temp: 36.8 C 37 C  Resp: 18 18    Last Pain:  Filed Vitals:   09/15/15 0505  PainSc: [name]

## 2015-09-16 LAB — CBC
HCT: 28.8 % — ABNORMAL LOW (ref 35.0–47.0)
Hemoglobin: 9.8 g/dL — ABNORMAL LOW (ref 12.0–16.0)
MCH: 29.2 pg (ref 26.0–34.0)
MCHC: 33.9 g/dL (ref 32.0–36.0)
MCV: 86 fL (ref 80.0–100.0)
PLATELETS: 231 10*3/uL (ref 150–440)
RBC: 3.35 MIL/uL — ABNORMAL LOW (ref 3.80–5.20)
RDW: 13.3 % (ref 11.5–14.5)
WBC: 11.8 10*3/uL — ABNORMAL HIGH (ref 3.6–11.0)

## 2015-09-16 LAB — BASIC METABOLIC PANEL
Anion gap: 6 (ref 5–15)
BUN: 8 mg/dL (ref 6–20)
CALCIUM: 8.9 mg/dL (ref 8.9–10.3)
CO2: 23 mmol/L (ref 22–32)
CREATININE: 0.62 mg/dL (ref 0.44–1.00)
Chloride: 102 mmol/L (ref 101–111)
GFR calc Af Amer: >60 mL/min (ref >60)
GLUCOSE: 124 mg/dL — AB (ref 65–99)
POTASSIUM: 4.5 mmol/L (ref 3.5–5.1)
SODIUM: 131 mmol/L — AB (ref 135–145)

## 2015-09-16 MED ORDER — LACTULOSE 10 GM/15ML PO SOLN: 10.0000 g | Freq: Two times a day (BID) | ORAL | Status: DC | PRN

## 2015-09-16 NOTE — Progress Notes (Signed)
Patient complains of traffic outside of door. Signed placed on door and nursing staff notified. Pain controlled with PRN medication and frequent rounding. Pt up to [facility] with one assist. Pt anxious about D/C plan of care. Education given with relief.

## 2015-09-16 NOTE — Progress Notes (Signed)
Patient offered suppository for BM, states she does not want it right now.

## 2015-09-16 NOTE — Progress Notes (Signed)
   Subjective: 2 Days Post-Op Procedure(s) (LRB): COMPUTER ASSISTED TOTAL KNEE ARTHROPLASTY (Right) Patient reports pain as moderate.   Patient is well, and has had no acute complaints or problems Continue with physical  therapy today.  Plan is to go Rehab after hospital stay. Nausea x 3 yesterday. Ok today Patient denies any chest pains or shortness of breath. Patient concerned about the ability to get in and out of bed. States she was having this problem prior to surgery. Patient has numerous little complaints like too much noise during the night in the hall, nurses not coming as soon as she calls. States when she has to go to the rest room, she has to go right then. Discussed with her yesterday that she needed to call just as soon as she thinks she may need to go. Sitting in chair this am  Objective: Vital signs in last 24 hours: Temp:  [97.9 F (36.6 C)-98.6 F (37 C)] 98.6 F (37 C) (06/28 0403) Pulse Rate:  [88-102] 102 (06/28 0403) Resp:  [16-18] 18 (06/28 0403) BP: (122-142)/(48-61) 142/61 mmHg (06/28 0403) SpO2:  [94 %-100 %] 96 % (06/28 0403) well approximated incision Heels are non tender and elevated off the bed using rolled towels Intake/Output from previous day: 06/27 0701 - 06/28 0700 In: [place] [P.O.:890; I.V.:730; IV Piggyback:100] Out: 1210 [Urine:1000; Drains:210] Intake/Output this shift: Total I/O In: 290 [P.O.:290] Out: 1000 [Urine:900; Drains:100]   Recent Labs  09/15/15 0317 09/16/15 0308  HGB 9.3* 9.8*    Recent Labs  09/15/15 0317 09/16/15 0308  WBC 11.3* 11.8*  RBC 3.11* 3.35*  HCT 26.8* 28.8*  PLT 203 231    Recent Labs  09/15/15 0317 09/16/15 0308  NA 136 131*  K 3.2* 4.5  CL 106 102  CO2 25 23  BUN 9 8  CREATININE 0.60 0.62  GLUCOSE 111* 124*  CALCIUM 8.3* 8.9   No results for input(s): LABPT, INR in the last 72 hours.  EXAM General - Patient is Alert, Appropriate and Oriented Extremity - Neurologically  intact Neurovascular intact Sensation intact distally Intact pulses distally Dorsiflexion/Plantar flexion intact Compartment soft Dressing - scant drainage Motor Function - intact, moving foot and toes well on exam.    Past Medical History  Diagnosis Date  . Hypertension   . Cancer ([place])   . Breast cancer ([place])     left breast cancer  . Left bundle branch block (LBBB)   . Hyperlipidemia   . Anxiety   . GERD (gastroesophageal reflux disease)   . Arthritis   . Neuropathy ([facility])     Assessment/Plan: 2 Days Post-Op Procedure(s) (LRB): COMPUTER ASSISTED TOTAL KNEE ARTHROPLASTY (Right) Active Problems:   S/P total knee arthroplasty  Estimated body mass index is 28.11 kg/(m^2) as calculated from the following:   Height as of this encounter: 5\' 6"  (1.676 m).   Weight as of this encounter: 78.971 kg (174 lb 1.6 oz). Up with therapy Discharge to SNF either this pm or tomorrow pending insurance  Labs: reviewed DVT Prophylaxis - Lovenox, Foot Pumps and TED hose Weight-Bearing as tolerated to right leg Needs a bowel movement. Lactulose added  [name] 09/16/2015, 6:58 AM

## 2015-09-16 NOTE — Progress Notes (Signed)
It was reported to me by day shift nurse that patient refused bowel prep. Patient states that she will take now

## 2015-09-16 NOTE — Progress Notes (Signed)
Physical Therapy Treatment Patient Details Name: DARCELLE SALADIN MRN: [id number] DOB: Mar 28, 1931 Today's Date: 09/16/2015    History of Present Illness Pt is a 82 yr old female s/p elective R TKA 09/14/15. PMH significant for L breast CA, HTN, anxiety, neuropathy, and hyperlipidemia.     PT Comments    Pt presented up in recliner and participated in seated TherEx until she reported increasing wooziness and feeling faint. Pt stated "this is how I feel when I pass out". Vitals screened: BP 152/66, HR 105 bpm, O2 98%. RN notified. Pt participated in ambulatory transfers with RW and min A recliner > BSC and BSC > EOB where she was returned to supine with mod A. R knee ROM improving -8 to 75 degrees. Will continue to progress transfer/gait training as tolerated by the pt.    Follow Up Recommendations  SNF     Equipment Recommendations   (has RW at home)    Recommendations for Other Services       Precautions / Restrictions Precautions Precautions: Fall;Knee Restrictions Weight Bearing Restrictions: Yes RLE Weight Bearing: Weight bearing as tolerated    Mobility  Bed Mobility Overal bed mobility: Needs Assistance Bed Mobility: Sit to Supine       Sit to supine: Mod assist   General bed mobility comments: HOB elevated, PT assist for RLE management. Pt able to complete ~5 bridges with LLE to scoot hips in bed.  Transfers Overall transfer level: Needs assistance Equipment used: Rolling walker (2 wheeled) Transfers: Sit to/from Stand (x2 trials) Sit to Stand: Mod assist         General transfer comment: Sit <> stand transfers to/from recliner and BSC. Vc's for hand/foot placement, sequencing, and breathing. Increased time required.  Ambulation/Gait Ambulation/Gait assistance: Min assist Ambulation Distance (Feet):  (1 x 64ft chair > BSC; 1 x 15ft backwards BSC > EOB) Assistive device: Rolling walker (2 wheeled) Gait Pattern/deviations: Step-to pattern;Decreased stance time -  right;Decreased step length - right;Decreased step length - left;Antalgic Gait velocity: decreased   General Gait Details: Vc's still required for technique and DME use, but less than yesterday. R knee pain persists during R stance. Moderate use of UE on RW and increased time required.   Stairs            Wheelchair Mobility    Modified Rankin (Stroke Patients Only)       Balance Overall balance assessment: Needs assistance Sitting-balance support: Bilateral upper extremity supported;Feet supported Sitting balance-Leahy Scale: Good Sitting balance - Comments: Maintains sitting EOB with close standby assist.   Standing balance support: Bilateral upper extremity supported (on RW ) Standing balance-Leahy Scale: Fair Standing balance comment: Pt demonstrates ability to stand without UE support (to complete LE dressing) with CGA.                    Cognition Arousal/Alertness: Awake/alert Behavior During Therapy: WFL for tasks assessed/performed Overall Cognitive Status: Within Functional Limits for tasks assessed                      Exercises Total Joint Exercises Ankle Circles/Pumps: AROM;Both;10 reps;Seated;Strengthening (long sitting) Quad Sets: AROM;Both;10 reps;Seated;Strengthening (long sitting) Gluteal Sets: AROM;Strengthening;Both;Seated (long sitting) Short Arc Quad: AROM;Strengthening;10 reps;Seated (long sitting) Hip ABduction/ADduction: AROM;10 reps;Strengthening;Seated;Both (long sitting) Straight Leg Raises: AROM;Strengthening;Both;10 reps;Seated (long sitting) Goniometric ROM: Supine R knee extension lacks 8 degrees from neutral. Seated R knee flexion 75 degrees.    General Comments General comments (skin integrity, edema, etc.):  Honeycomb bandage in place, no drainage noted. Polar care on and running      Pertinent Vitals/Pain Pain Assessment: 0-10 Pain Score: 10-Worst pain ever (with activity, 0/10 at rest) Pain Location: R knee Pain  Descriptors / Indicators: Operative site guarding Pain Intervention(s): Limited activity within patient's tolerance;Monitored during session;Repositioned;Relaxation;Ice applied           PT Goals (current goals can now be found in the care plan section) Acute Rehab PT Goals Patient Stated Goal: To regain independence PT Goal Formulation: With patient Time For Goal Achievement: 09/29/15 Potential to Achieve Goals: Good Progress towards PT goals: Progressing toward goals    Frequency  BID    PT Plan Current plan remains appropriate    Co-evaluation             End of Session Equipment Utilized During Treatment: Gait belt Activity Tolerance: Treatment limited secondary to medical complications (Comment) (pt with c/o increasing faintness "this is how I feel when I pass out) Patient left: in bed;with call bell/phone within reach;with bed alarm set;with family/visitor present; SCDs on; bilat heels elevated on towel rolls; polar care on     Time: 0820-0901 PT Time Calculation (min) (ACUTE ONLY): 41 min  Charges:                       G Codes:      Aleigh Grunden, SPT 09/16/2015, 10:04 AM

## 2015-09-16 NOTE — Addendum Note (Signed)
Addendum  created 09/16/15 1520 by Iver Nestle, MD   Modules edited: Clinical Notes   Clinical Notes:  File: [id number]

## 2015-09-16 NOTE — Progress Notes (Signed)
Occupational Therapy Treatment Patient Details Name: Carly Peck MRN: [id number] DOB: 12-May-1931 Today's Date: 09/16/2015    History of present illness Pt is a 82 yr old female s/p elective R TKA 09/14/15. PMH significant for L breast CA, HTN, anxiety, neuropathy, and hyperlipidemia.    OT comments  Pt seen for ADL retraining which was limited due to patient falling asleep from pain medications. Discussed recommendations and use of AD and husband educated as well since patient was having difficulty with alertness from medications and feeling "woozy" still.  Pt's husband needed a lot of education and given handout about how a transfer tub bench functions since he may have difficulty installing grab bars in shower stall due to ceramic tile.  Also reviewed other items in AD catalog including elastic shoe laces, walker bag, LH sponge, etc and answered questions about rec.  Pt to go to [facility] for rehab today or tomorrow for continued rehab.     Follow Up Recommendations  Home health OT    Equipment Recommendations  Tub/shower bench    Recommendations for Other Services      Precautions / Restrictions Precautions Precautions: Fall;Knee Required Braces or Orthoses: Knee Immobilizer - Right Restrictions Weight Bearing Restrictions: Yes RLE Weight Bearing: Weight bearing as tolerated       Mobility Bed Mobility Overal bed mobility: Needs Assistance Bed Mobility: Sit to Supine       Sit to supine: Mod assist   General bed mobility comments: HOB elevated, PT assist for RLE management. Pt able to complete ~5 bridges with LLE to scoot hips in bed.  Transfers Overall transfer level: Needs assistance Equipment used: Rolling walker (2 wheeled) Transfers: Sit to/from Stand (x2 trials) Sit to Stand: Mod assist         General transfer comment: Sit <> stand transfers to/from recliner and BSC. Vc's for hand/foot placement, sequencing, and breathing. Increased time required.     Balance Overall balance assessment: Needs assistance Sitting-balance support: Bilateral upper extremity supported;Feet supported Sitting balance-Leahy Scale: Good Sitting balance - Comments: Maintains sitting EOB with close standby assist.   Standing balance support: Bilateral upper extremity supported (on RW ) Standing balance-Leahy Scale: Fair Standing balance comment: Pt demonstrates ability to stand without UE support (to complete LE dressing) with CGA.                   ADL Overall ADL's : Needs assistance/impaired                                       General ADL Comments: Pt needed constant cues to attend to task due to drowsiness from pain meds.  Educated pt and her husband in rec equipment for ADLs and given handout about transfer tub bench in case her husband cannot install grab bars in shower stall since it is ceramic tile.        Vision                     Perception     Praxis      Cognition   Behavior During Therapy: Anxious Overall Cognitive Status: Within Functional Limits for tasks assessed                       Extremity/Trunk Assessment               Exercises  Total Joint Exercises Ankle Circles/Pumps: AROM;Both;10 reps;Seated;Strengthening Quad Sets: AROM;Both;10 reps;Seated;Strengthening (long sitting) Gluteal Sets: AROM;Strengthening;Both;Seated (long sitting) Short Arc Quad: AROM;Strengthening;10 reps;Seated (long sitting) Hip ABduction/ADduction: AROM;10 reps;Strengthening;Seated;Both (long sitting) Straight Leg Raises: AROM;Strengthening;Both;10 reps;Seated (long sitting) Goniometric ROM: Supine R knee extension lacks 8 degrees from neutral. Seated R knee flexion 75 degrees.   Shoulder Instructions       General Comments      Pertinent Vitals/ Pain       Pain Assessment: No/denies pain Pain Score: 9  Pain Location: R knee Pain Descriptors / Indicators: Aching;Constant;Operative site  guarding Pain Intervention(s): Limited activity within patient's tolerance;Monitored during session;Repositioned;Ice applied;Premedicated before session  Home Living                                          Prior Functioning/Environment              Frequency Min 1X/week     Progress Toward Goals  OT Goals(current goals can now be found in the care plan section)  Progress towards OT goals: Progressing toward goals  Acute Rehab OT Goals Patient Stated Goal: To regain independence OT Goal Formulation: With patient/family Potential to Achieve Goals: Good  Plan Discharge plan needs to be updated;Discharge plan remains appropriate    Co-evaluation                 End of Session     Activity Tolerance Patient limited by lethargy;Patient limited by pain   Patient Left in chair;with call bell/phone within reach;with chair alarm set;with family/visitor present   Nurse Communication          Time: [id number] OT Time Calculation (min): 28 min  Charges: OT General Charges $OT Visit: 1 Procedure OT Treatments $Self Care/Home Management : 23-37 mins   Chrys Racer, OTR/L ascom [phone]  09/16/2015, 12:40 PM

## 2015-09-16 NOTE — Progress Notes (Signed)
Pt complaints of pain with foot pumps and removed

## 2015-09-16 NOTE — Progress Notes (Signed)
Physical Therapy Treatment Patient Details Name: Carly Peck MRN: [id number] DOB: 12/26/1931 Today's Date: 09/16/2015    History of Present Illness Pt is a 82 yr old female s/p elective R TKA 09/14/15. PMH significant for L breast CA, HTN, anxiety, neuropathy, and hyperlipidemia.     PT Comments    Pt appearing much better than this morning, reporting less wooziness and tiredness overall. Pt demonstrated progress towards gait goal by ambulating 25ft with RW and min A. Functional mobility and R knee ROM remain limited by significant R knee pain with any movement (up to 7/10). Remains a good candidate for continued rehabilitation and mobility training at SNF.    Follow Up Recommendations  SNF     Equipment Recommendations   (has RW at home)    Recommendations for Other Services       Precautions / Restrictions Precautions Precautions: Fall;Knee Restrictions Weight Bearing Restrictions: Yes RLE Weight Bearing: Weight bearing as tolerated    Mobility  Bed Mobility Overal bed mobility: Needs Assistance Bed Mobility: Sit to Supine  Sit to supine: HOB elevated;Min assist   General bed mobility comments: HOB elevated, PT assist for RLE management. Pt participates in bridging with LLE to scoot and uses bilat UEs on trapeze bar to assist with boost up in bed.  Transfers Overall transfer level: Needs assistance Equipment used: Rolling walker (2 wheeled) Transfers: Sit to/from Stand Sit to Stand: Min assist  General transfer comment: Pt stands in flexed posturing and requires UE support on RW in order to simultaneously extend bilat hips and knees. Increased time required.  Ambulation/Gait Ambulation/Gait assistance: Min assist Ambulation Distance (Feet): 15 Feet Assistive device: Rolling walker (2 wheeled) Gait Pattern/deviations: Step-to pattern;Decreased step length - right;Decreased step length - left;Decreased stance time - right;Antalgic Gait velocity: decreased   General  Gait Details: Vc's for DME use and step length, which pt is able to adhere to. Continues to rely on moderate UE support on RW during R stance.   Stairs     Wheelchair Mobility    Modified Rankin (Stroke Patients Only)       Balance Overall balance assessment: Needs assistance Sitting-balance support: Feet supported Sitting balance-Leahy Scale: Good Sitting balance - Comments: Pt able to maintain sitting EOB with supervision   Standing balance support: Bilateral upper extremity supported Standing balance-Leahy Scale: Fair Standing balance comment: Pt able to ambulate 57ft with UE support on RW and CGA.    Cognition Arousal/Alertness: Awake/alert Behavior During Therapy: WFL for tasks assessed/performed;Anxious Overall Cognitive Status: Within Functional Limits for tasks assessed    Exercises Total Joint Exercises Short Arc Quad: AROM;Strengthening;Right;10 reps;Supine Heel Slides: AAROM;Strengthening;Right;10 reps;Supine Hip ABduction/ADduction: AROM;Strengthening;Right;10 reps;Supine Straight Leg Raises: AAROM;Strengthening;Right;10 reps;Supine (AAROM for increased range)    General Comments General comments (skin integrity, edema, etc.): Honeycomb bandage in place at R knee, no drainage noted.      Pertinent Vitals/Pain Pain Assessment: 0-10 Pain Score: 7  (with activity, 1/10 at rest) Pain Location: R knee Pain Descriptors / Indicators: Aching;Operative site guarding Pain Intervention(s): Limited activity within patient's tolerance;Monitored during session;Repositioned;Relaxation;Ice applied           PT Goals (current goals can now be found in the care plan section) Acute Rehab PT Goals Patient Stated Goal: To regain independence PT Goal Formulation: With patient Time For Goal Achievement: 09/29/15 Potential to Achieve Goals: Good Progress towards PT goals: Progressing toward goals    Frequency  BID    PT Plan Current plan remains appropriate  End of Session Equipment Utilized During Treatment: Gait belt Activity Tolerance: Patient tolerated treatment well Patient left: in bed;with call bell/phone within reach;with bed alarm set;with family/visitor present;with SCD's reapplied (bilat heels elevated on towel rolls, polar care running)     Time: [id number] PT Time Calculation (min) (ACUTE ONLY): 25 min  Charges:                     G Codes:      Demorio Seeley, SPT 09/16/2015, 3:00 PM

## 2015-09-16 NOTE — Progress Notes (Signed)
Checked to see if pre-authorization would be needed for non-emergent EMS transport. Per [employer] benefits obtained online through [employer], patient has a [employer] Medicare Advantage PPO policy.  Medicare PPO plans do not require pre-auth for non-emergent ground transports using service codes A0426 or A0428.   

## 2015-09-17 NOTE — Progress Notes (Signed)
Report called to [facility], [place], [place]. Pt transported by EMS via stretcher. Husband to accompany with belongings.

## 2015-09-17 NOTE — Progress Notes (Signed)
   Subjective: 3 Days Post-Op Procedure(s) (LRB): COMPUTER ASSISTED TOTAL KNEE ARTHROPLASTY (Right) Patient reports pain as mild.   Patient is well, and has had no acute complaints or problems Continue with physical  therapy today.  Plan is to go Rehab after hospital stay. no nausea and no vomiting Patient denies any chest pains or shortness of breath. Lighted headed yesterday. H/o this at home. Pt states she is taking Tylenol now. Narcotics making her "loppy"   Objective: Vital signs in last 24 hours: Temp:  [98.1 F (36.7 C)-98.4 F (36.9 C)] 98.1 F (36.7 C) (06/29 0344) Pulse Rate:  [94-108] 105 (06/29 0344) Resp:  [16-18] 16 (06/29 0344) BP: (123-136)/(48-69) 133/55 mmHg (06/29 0344) SpO2:  [94 %-97 %] 97 % (06/29 0344) well approximated incision Heels are non tender and elevated off the bed using rolled towels Intake/Output from previous day: 06/28 0701 - 06/29 0700 In: 840 [P.O.:840] Out: 300 [Urine:300] Intake/Output this shift:     Recent Labs  09/15/15 0317 09/16/15 0308  HGB 9.3* 9.8*    Recent Labs  09/15/15 0317 09/16/15 0308  WBC 11.3* 11.8*  RBC 3.11* 3.35*  HCT 26.8* 28.8*  PLT 203 231    Recent Labs  09/15/15 0317 09/16/15 0308  NA 136 131*  K 3.2* 4.5  CL 106 102  CO2 25 23  BUN 9 8  CREATININE 0.60 0.62  GLUCOSE 111* 124*  CALCIUM 8.3* 8.9   No results for input(s): LABPT, INR in the last 72 hours.  EXAM General - Patient is Alert, Appropriate and Oriented Extremity - Neurologically intact Neurovascular intact Sensation intact distally Intact pulses distally Dorsiflexion/Plantar flexion intact Compartment soft Dressing - scant drainage Motor Function - intact, moving foot and toes well on exam.    Past Medical History  Diagnosis Date  . Hypertension   . Cancer ([place])   . Breast cancer ([place])     left breast cancer  . Left bundle branch block (LBBB)   . Hyperlipidemia   . Anxiety   . GERD (gastroesophageal reflux  disease)   . Arthritis   . Neuropathy ([facility])     Assessment/Plan: 3 Days Post-Op Procedure(s) (LRB): COMPUTER ASSISTED TOTAL KNEE ARTHROPLASTY (Right) Active Problems:   S/P total knee arthroplasty  Estimated body mass index is 28.11 kg/(m^2) as calculated from the following:   Height as of this encounter: 5\' 6"  (1.676 m).   Weight as of this encounter: 78.971 kg (174 lb 1.6 oz). Up with therapy Discharge to SNF  Labs: none DVT Prophylaxis - Lovenox, Foot Pumps and TED hose Weight-Bearing as tolerated to right leg Please change dressing prior to discharge  Jon R. [place] 09/17/2015, 7:05 AM

## 2015-09-17 NOTE — Care Management Important Message (Signed)
Important Message  Patient Details  Name: Carly Peck MRN: [id number] Date of Birth: 1931-08-26   Medicare Important Message Given:  Yes    Juliann Pulse A Berta Denson 09/17/2015, 10:27 AM

## 2015-09-17 NOTE — Clinical Social Work Placement (Signed)
   CLINICAL SOCIAL WORK PLACEMENT  NOTE  Date:  09/17/2015  Patient Details  Name: Carly Peck MRN: [id number] Date of Birth: 09-Nov-1931  Clinical Social Work is seeking post-discharge placement for this patient at the [place] level of care (*CSW will initial, date and re-position this form in  chart as items are completed):  Yes   Patient/family provided with [place] Work Department's list of facilities offering this level of care within the geographic area requested by the patient (or if unable, by the patient's family).  Yes   Patient/family informed of their freedom to choose among providers that offer the needed level of care, that participate in Medicare, Medicaid or managed care program needed by the patient, have an available bed and are willing to accept the patient.  Yes   Patient/family informed of [place]'s ownership interest in [facility] and [facility], as well as of the fact that they are under no obligation to receive care at these facilities.  PASRR submitted to EDS on 09/15/15     PASRR number received on 09/15/15     Existing PASRR number confirmed on       FL2 transmitted to all facilities in geographic area requested by pt/family on 09/15/15     FL2 transmitted to all facilities within larger geographic area on       Patient informed that his/her managed care company has contracts with or will negotiate with certain facilities, including the following:        Yes   Patient/family informed of bed offers received.  Patient chooses bed at  [facility] )     Physician recommends and patient chooses bed at      Patient to be transferred to  [facility] ) on 09/17/15.  Patient to be transferred to facility by  [facility] )     Patient family notified on 09/17/15 of transfer.  Name of family member notified:   (Patient's husband is at bedside and aware of D/C today. )     PHYSICIAN        Additional Comment:    _______________________________________________ Loralyn Freshwater, LCSW 09/17/2015, 10:51 AM

## 2015-09-17 NOTE — Plan of Care (Signed)
Problem: Bowel/Gastric: Goal: Will not experience complications related to bowel motility Outcome: Progressing Bowel prep given. See flow sheet

## 2015-09-17 NOTE — Progress Notes (Signed)
Physical Therapy Treatment Patient Details Name: Carly Peck MRN: [id number] DOB: [birth year] Today's Date: 09/17/2015    History of Present Illness Pt is a 82 yr old female s/p elective R TKA 09/14/15. PMH significant for L breast CA, HTN, anxiety, neuropathy, and hyperlipidemia.     PT Comments    Pt making progress towards all goals, able to perform bed mobility, sit <> stand transfers with RW, and ambulate x64ft with RW; all with CGA. Requires increased time for all mobility, but does demonstrate safety and stability. R knee ROM has improved to -5 to 90 degrees with overpressure.    Follow Up Recommendations  SNF     Equipment Recommendations   (has RW at home)    Recommendations for Other Services       Precautions / Restrictions Precautions Precautions: Fall;Knee Restrictions Weight Bearing Restrictions: Yes RLE Weight Bearing: Weight bearing as tolerated    Mobility  Bed Mobility Overal bed mobility: Needs Assistance Bed Mobility: Supine to Sit     Supine to sit: HOB elevated;Min guard     General bed mobility comments: Pt able to assume EOB without physical assist; requires CGA for safety to prevent forward slide when shifting hips.  Transfers Overall transfer level: Needs assistance Equipment used: Rolling walker (2 wheeled) Transfers: Sit to/from Stand Sit to Stand: Min guard         General transfer comment: Pt stands in flexed posturing and requires UE support on RW in order to simultaneously extend bilat hips and knees. Increased time required.  Ambulation/Gait Ambulation/Gait assistance: Min guard Ambulation Distance (Feet): 35 Feet Assistive device: Rolling walker (2 wheeled) Gait Pattern/deviations: Step-to pattern;Decreased step length - right;Decreased step length - left;Decreased stance time - right;Decreased weight shift to right Gait velocity: decreased   General Gait Details: Initially, pt requires max vc's for step length and DME use,  but with increasing distance vc's are decreased. No increase in pain with ambulation.   Stairs     Wheelchair Mobility    Modified Rankin (Stroke Patients Only)       Balance Overall balance assessment: Needs assistance Sitting-balance support: Feet supported Sitting balance-Leahy Scale: Good     Standing balance support: Bilateral upper extremity supported (on RW) Standing balance-Leahy Scale: Fair         Cognition Arousal/Alertness: Awake/alert Behavior During Therapy: WFL for tasks assessed/performed;Anxious Overall Cognitive Status: Within Functional Limits for tasks assessed         Exercises Total Joint Exercises Ankle Circles/Pumps: AROM Quad Sets: AROM Short Arc Quad: AROM Heel Slides: AAROM RLE; AROM LLE Hip ABduction/ADduction: AROM Straight Leg Raises: AROM All exercises performed bilaterally x 10 reps in supine.  Goniometric ROM: Supine R knee extension lacks 5 degrees from neutral. Seated R knee flexion 90 degrees.    General Comments General comments (skin integrity, edema, etc.): Honeycomb bandage in place at R knee with scant drainage noted. Nursing cleared pt for participation in physical therapy.  Pt agreeable to PT session.       Pertinent Vitals/Pain Pain Assessment: 0-10 Pain Score: 6  Pain Location: R knee Pain Intervention(s): Limited activity within patient's tolerance;Monitored during session;Repositioned;Ice applied (RN notified; pt declining pain meds during session)  HR and O2 monitored throughout session and maintained WFL.            PT Goals (current goals can now be found in the care plan section) Acute Rehab PT Goals Patient Stated Goal: To regain independence PT Goal Formulation: With patient  Time For Goal Achievement: 09/29/15 Potential to Achieve Goals: Good Progress towards PT goals: Progressing toward goals    Frequency  BID    PT Plan Current plan remains appropriate       End of Session Equipment  Utilized During Treatment: Gait belt Activity Tolerance: Patient tolerated treatment well Patient left: in chair;with call bell/phone within reach;with chair alarm set;with family/visitor present;with SCD's reapplied (; bilat heels elevated on towel rolls; polar care running)     Time: [id number] PT Time Calculation (min) (ACUTE ONLY): 42 min  Charges:                       G Codes:      Tylesha Gibeault, SPT 09/17/2015, 10:02 AM

## 2015-09-17 NOTE — Progress Notes (Signed)
Patient is medically stable for D/C to [facility] today. Per Seth Bake admissions coordinator at [facility] patient will go to room 311. RN will call report at [phone] and arrange EMS for transport. Clinical Education officer, museum (CSW) sent D/C Summary, FL2 and D/C Packet to [employer] via [employer]. Patient is aware of above. Patient's husband is at bedside and aware of above. Please reconsult if future social work needs arise. CSW signing off.   Blima Rich, LCSW [phone]

## 2015-09-17 NOTE — Progress Notes (Signed)
Pt refuses pain medication states it makes her "loopy" pt expresses that she will notify nursing staff for pain medication when needed. No acute distress noted. Resting between care.

## 2015-09-18 DIAGNOSIS — K219 Gastro-esophageal reflux disease without esophagitis: Secondary | ICD-10-CM | POA: Diagnosis not present

## 2015-09-18 DIAGNOSIS — M1711 Unilateral primary osteoarthritis, right knee: Secondary | ICD-10-CM | POA: Diagnosis not present

## 2015-09-18 DIAGNOSIS — G629 Polyneuropathy, unspecified: Secondary | ICD-10-CM | POA: Diagnosis not present

## 2015-10-07 DIAGNOSIS — Z0000 Encounter for general adult medical examination without abnormal findings: Secondary | ICD-10-CM | POA: Insufficient documentation

## 2015-11-02 ENCOUNTER — Other Ambulatory Visit: Payer: 59

## 2015-11-22 ENCOUNTER — Other Ambulatory Visit: Payer: Self-pay | Admitting: Oncology

## 2015-11-22 DIAGNOSIS — C880 Waldenstrom macroglobulinemia: Secondary | ICD-10-CM

## 2015-11-30 ENCOUNTER — Ambulatory Visit: Payer: 59 | Admitting: Oncology

## 2015-11-30 ENCOUNTER — Other Ambulatory Visit: Payer: 59

## 2015-12-01 ENCOUNTER — Inpatient Hospital Stay: Payer: 59

## 2015-12-01 ENCOUNTER — Other Ambulatory Visit: Payer: Self-pay | Admitting: *Deleted

## 2015-12-01 ENCOUNTER — Inpatient Hospital Stay: Payer: 59 | Admitting: Oncology

## 2015-12-01 DIAGNOSIS — C880 Waldenstrom macroglobulinemia: Secondary | ICD-10-CM

## 2015-12-02 ENCOUNTER — Inpatient Hospital Stay: Payer: Medicare Other | Attending: Internal Medicine

## 2015-12-02 ENCOUNTER — Other Ambulatory Visit: Payer: 59

## 2015-12-02 ENCOUNTER — Ambulatory Visit: Payer: 59 | Admitting: Oncology

## 2015-12-02 ENCOUNTER — Other Ambulatory Visit: Payer: Self-pay

## 2015-12-02 DIAGNOSIS — M129 Arthropathy, unspecified: Secondary | ICD-10-CM | POA: Insufficient documentation

## 2015-12-02 DIAGNOSIS — E785 Hyperlipidemia, unspecified: Secondary | ICD-10-CM | POA: Insufficient documentation

## 2015-12-02 DIAGNOSIS — I447 Left bundle-branch block, unspecified: Secondary | ICD-10-CM | POA: Diagnosis not present

## 2015-12-02 DIAGNOSIS — F419 Anxiety disorder, unspecified: Secondary | ICD-10-CM | POA: Insufficient documentation

## 2015-12-02 DIAGNOSIS — K219 Gastro-esophageal reflux disease without esophagitis: Secondary | ICD-10-CM | POA: Insufficient documentation

## 2015-12-02 DIAGNOSIS — Z79899 Other long term (current) drug therapy: Secondary | ICD-10-CM | POA: Insufficient documentation

## 2015-12-02 DIAGNOSIS — C880 Waldenstrom macroglobulinemia: Secondary | ICD-10-CM | POA: Insufficient documentation

## 2015-12-02 DIAGNOSIS — M79605 Pain in left leg: Secondary | ICD-10-CM | POA: Insufficient documentation

## 2015-12-02 DIAGNOSIS — I10 Essential (primary) hypertension: Secondary | ICD-10-CM | POA: Diagnosis not present

## 2015-12-02 DIAGNOSIS — G629 Polyneuropathy, unspecified: Secondary | ICD-10-CM | POA: Diagnosis not present

## 2015-12-02 DIAGNOSIS — Z9221 Personal history of antineoplastic chemotherapy: Secondary | ICD-10-CM | POA: Insufficient documentation

## 2015-12-02 DIAGNOSIS — Z853 Personal history of malignant neoplasm of breast: Secondary | ICD-10-CM | POA: Insufficient documentation

## 2015-12-02 DIAGNOSIS — R7989 Other specified abnormal findings of blood chemistry: Secondary | ICD-10-CM | POA: Diagnosis not present

## 2015-12-02 DIAGNOSIS — D472 Monoclonal gammopathy: Secondary | ICD-10-CM | POA: Insufficient documentation

## 2015-12-02 LAB — CBC WITH DIFFERENTIAL/PLATELET
BASOS ABS: 0.1 10*3/uL (ref 0–0.1)
BASOS PCT: 1 %
Eosinophils Absolute: 0.2 10*3/uL (ref 0–0.7)
Eosinophils Relative: 2 %
HEMATOCRIT: 33.4 % — AB (ref 35.0–47.0)
HEMOGLOBIN: 11.3 g/dL — AB (ref 12.0–16.0)
LYMPHS PCT: 14 %
Lymphs Abs: 1.1 10*3/uL (ref 1.0–3.6)
MCH: 27 pg (ref 26.0–34.0)
MCHC: 33.7 g/dL (ref 32.0–36.0)
MCV: 80.1 fL (ref 80.0–100.0)
MONO ABS: 0.7 10*3/uL (ref 0.2–0.9)
Monocytes Relative: 10 %
NEUTROS ABS: 5.5 10*3/uL (ref 1.4–6.5)
NEUTROS PCT: 73 %
Platelets: 335 10*3/uL (ref 150–440)
RBC: 4.17 MIL/uL (ref 3.80–5.20)
RDW: 14.6 % — AB (ref 11.5–14.5)
WBC: 7.6 10*3/uL (ref 3.6–11.0)

## 2015-12-02 LAB — COMPREHENSIVE METABOLIC PANEL
ALBUMIN: 3.9 g/dL (ref 3.5–5.0)
ALK PHOS: 74 U/L (ref 38–126)
ALT: 11 U/L — AB (ref 14–54)
AST: 15 U/L (ref 15–41)
Anion gap: 9 (ref 5–15)
BILIRUBIN TOTAL: 0.5 mg/dL (ref 0.3–1.2)
BUN: 17 mg/dL (ref 6–20)
CO2: 24 mmol/L (ref 22–32)
CREATININE: 0.83 mg/dL (ref 0.44–1.00)
Calcium: 9.8 mg/dL (ref 8.9–10.3)
Chloride: 98 mmol/L — ABNORMAL LOW (ref 101–111)
GFR calc Af Amer: >60 mL/min (ref >60)
GLUCOSE: 99 mg/dL (ref 65–99)
Potassium: 4.3 mmol/L (ref 3.5–5.1)
Sodium: 131 mmol/L — ABNORMAL LOW (ref 135–145)
TOTAL PROTEIN: 7.9 g/dL (ref 6.5–8.1)

## 2015-12-02 LAB — SEDIMENTATION RATE: SED RATE: 89 mm/h — AB (ref 0–30)

## 2015-12-03 LAB — KAPPA/LAMBDA LIGHT CHAINS
KAPPA FREE LGHT CHN: 7.9 mg/L (ref 3.3–19.4)
Kappa, lambda light chain ratio: 0.22 — ABNORMAL LOW (ref 0.26–1.65)
LAMDA FREE LIGHT CHAINS: 35.5 mg/L — AB (ref 5.7–26.3)

## 2015-12-07 ENCOUNTER — Encounter: Payer: Self-pay | Admitting: Internal Medicine

## 2015-12-07 ENCOUNTER — Inpatient Hospital Stay (HOSPITAL_BASED_OUTPATIENT_CLINIC_OR_DEPARTMENT_OTHER): Payer: Medicare Other | Admitting: Internal Medicine

## 2015-12-07 VITALS — BP 124/78 | HR 96 | Temp 98.5°F | Wt 156.8 lb

## 2015-12-07 DIAGNOSIS — C880 Waldenstrom macroglobulinemia: Secondary | ICD-10-CM | POA: Diagnosis not present

## 2015-12-07 DIAGNOSIS — Z853 Personal history of malignant neoplasm of breast: Secondary | ICD-10-CM

## 2015-12-07 DIAGNOSIS — I10 Essential (primary) hypertension: Secondary | ICD-10-CM

## 2015-12-07 DIAGNOSIS — G629 Polyneuropathy, unspecified: Secondary | ICD-10-CM

## 2015-12-07 DIAGNOSIS — M79605 Pain in left leg: Secondary | ICD-10-CM | POA: Diagnosis not present

## 2015-12-07 DIAGNOSIS — E785 Hyperlipidemia, unspecified: Secondary | ICD-10-CM

## 2015-12-07 DIAGNOSIS — R7989 Other specified abnormal findings of blood chemistry: Secondary | ICD-10-CM | POA: Diagnosis not present

## 2015-12-07 DIAGNOSIS — D472 Monoclonal gammopathy: Secondary | ICD-10-CM

## 2015-12-07 DIAGNOSIS — Z79899 Other long term (current) drug therapy: Secondary | ICD-10-CM

## 2015-12-07 DIAGNOSIS — M129 Arthropathy, unspecified: Secondary | ICD-10-CM

## 2015-12-07 DIAGNOSIS — F419 Anxiety disorder, unspecified: Secondary | ICD-10-CM

## 2015-12-07 DIAGNOSIS — K219 Gastro-esophageal reflux disease without esophagitis: Secondary | ICD-10-CM

## 2015-12-07 DIAGNOSIS — Z9221 Personal history of antineoplastic chemotherapy: Secondary | ICD-10-CM

## 2015-12-07 DIAGNOSIS — I447 Left bundle-branch block, unspecified: Secondary | ICD-10-CM

## 2015-12-07 LAB — MULTIPLE MYELOMA PANEL, SERUM
ALBUMIN SERPL ELPH-MCNC: 3.6 g/dL (ref 2.9–4.4)
ALPHA 1: 0.3 g/dL (ref 0.0–0.4)
Albumin/Glob SerPl: 1 (ref 0.7–1.7)
Alpha2 Glob SerPl Elph-Mcnc: 0.8 g/dL (ref 0.4–1.0)
B-Globulin SerPl Elph-Mcnc: 1.1 g/dL (ref 0.7–1.3)
GLOBULIN, TOTAL: 3.8 g/dL (ref 2.2–3.9)
Gamma Glob SerPl Elph-Mcnc: 1.5 g/dL (ref 0.4–1.8)
IGA: 31 mg/dL — AB (ref 64–422)
IGM, SERUM: 2351 mg/dL — AB (ref 26–217)
IgG (Immunoglobin G), Serum: 376 mg/dL — ABNORMAL LOW (ref 700–1600)
M Protein SerPl Elph-Mcnc: 0.9 g/dL — ABNORMAL HIGH
Total Protein ELP: 7.4 g/dL (ref 6.0–8.5)

## 2015-12-07 NOTE — Assessment & Plan Note (Addendum)
#  MGUS-IgM lambda- most recent M protein  SEP 2017-0.9 g/dL [June 2016-0.9 g].  CBC within normal limits except for mildly low hemoglobin of 11.  Monitor for now.  # s/p TKA right- Dr.Hooten.   #  Chronic neuropathy-nortryptline/ gabapentin.   # Elevated ESR- likely artifactual given- M protein in the blood. CRP   # History of breast cancer- ER/PR positive high risk/stage III. Clinically no evidence of recurrence.  # follow in one year with labs few days prior.

## 2015-12-07 NOTE — Progress Notes (Signed)
Patient ambulates without assistance, brought to exam room 16.  Patient denies pain or discomfort.  Vitals stable and documented

## 2015-12-07 NOTE — Progress Notes (Signed)
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 degen changes; M spike- 1.2gm/dl  # 2016-Temporal A Bx-neg [headaches/diziness].   # Bil LE PN  # 1997-LEFT BREAST CA Stage III  ER-POS;s/p MASTEC;s/p chemo; s/p RT NSABP- 33; s/p Aromasin.   # ESR- Elevated ? From Monoclonal gammopathy  INTERVAL HISTORY:  82 year old female patient with above history of IgM-MGUS is here for follow-up for elevated ESR.   Patient complains of left lower extremity pain; intermittent swelling. Follows up with neurology.   Otherwise not losing any weight. No nausea vomiting. No other aches and pains.  REVIEW OF SYSTEMS:  A complete 10 point review of system is done which is negative except mentioned above/history of present illness.   PAST MEDICAL HISTORY :  Past Medical History:  Diagnosis Date  . Anxiety   . Arthritis   . Breast cancer ([place])    left breast cancer  . Cancer ([place])   . GERD (gastroesophageal reflux disease)   . History of left breast cancer   . Hyperlipidemia   . Hypertension   . Left bundle branch block (LBBB)   . Neuropathy ([place])     PAST SURGICAL HISTORY :   Past Surgical History:  Procedure Laterality Date  . BACK SURGERY    . BREAST SURGERY    . CARDIAC CATHETERIZATION    . DILATION AND CURETTAGE OF UTERUS    . EYE SURGERY Bilateral    Cataract Extraction with IOL  . KNEE ARTHROPLASTY Right 09/14/2015   Procedure: COMPUTER ASSISTED TOTAL KNEE ARTHROPLASTY;  Surgeon: Dereck Leep, MD;  Location: [facility] ORS;  Service: Orthopedics;  Laterality: Right;  . KYPHOPLASTY  2016   Dr. Rudene Christians, [facility]  . MASTECTOMY    .  TONSILLECTOMY      FAMILY HISTORY :  No family history on file.  SOCIAL HISTORY:   Social History  Substance Use Topics  . Smoking status: Never Smoker  . Smokeless tobacco: Never Used  . Alcohol use No    ALLERGIES:  is allergic to adhesive [tape] and ultram [tramadol].  MEDICATIONS:  Current Outpatient Prescriptions  Medication Sig Dispense Refill  . acetaminophen (TYLENOL) 500 MG tablet Take 1,000 mg by mouth 2 (two) times daily as needed for mild pain or headache.     . carvedilol (COREG) 3.125 MG tablet Take 3.125 mg by mouth 2 (two) times daily.    . celecoxib (CELEBREX) 200 MG capsule Take 200 mg by mouth daily.    . cetirizine (ZYRTEC) 10 MG tablet Take 10 mg by mouth daily.    . cholecalciferol (VITAMIN D) 1000 UNITS tablet Take 2,000 Units by mouth daily.     . clobetasol ointment (TEMOVATE) 0.05 % APPLY TO AFFECTED AREA DAILY AS NEEDED for rash on buttocks  3  . enoxaparin (LOVENOX) 30 MG/0.3ML injection Inject 0.3 mLs (30 mg total) into the skin every 12 (twelve) hours. 28 Syringe 0  . fluticasone (FLONASE) 50 MCG/ACT nasal spray Place 2 sprays into both nostrils 2 (two) times daily as needed for rhinitis.    Marland Kitchen gabapentin (NEURONTIN) 100 MG capsule Take 3 capsules (300 mg total) by mouth 3 (three) times  daily. (Patient taking differently: Take 200 mg by mouth 2 (two) times daily. )    . gabapentin (NEURONTIN) 100 MG capsule TAKE 2 CAPSULES (200 MG TOTAL) BY MOUTH 3 (THREE) TIMES DAILY. 540 capsule 2  . losartan (COZAAR) 100 MG tablet Take 100 mg by mouth daily.   11  . omeprazole (PRILOSEC) 20 MG capsule Take 20 mg by mouth daily.    . sennosides-docusate sodium (SENOKOT-S) 8.6-50 MG tablet Take 3-4 tablets by mouth at bedtime. May take 4, if needed    . simvastatin (ZOCOR) 40 MG tablet Take 40 mg by mouth at bedtime.    . torsemide (DEMADEX) 5 MG tablet Take 2.5 mg by mouth every morning.   11  . Wheat Dextrin (BENEFIBER DRINK MIX PO) Take 15 mg by mouth every morning.      No current facility-administered medications for this visit.     PHYSICAL EXAMINATION:  BP 124/78 (BP Location: Right Arm, Patient Position: Sitting)   Pulse 96   Temp 98.5 F (36.9 C) (Tympanic)   Wt 156 lb 12.8 oz (71.1 kg)   BMI 25.31 kg/m   Filed Weights   12/07/15 1545  Weight: 156 lb 12.8 oz (71.1 kg)    GENERAL: Well-nourished well-developed; Alert, no distress and comfortable.  She is alone.  EYES: no pallor or icterus OROPHARYNX: no thrush or ulceration; good dentition  NECK: supple, no masses felt LYMPH:  no palpable lymphadenopathy in the cervical, axillary or inguinal regions LUNGS: clear to auscultation and  No wheeze or crackles HEART/CVS: regular rate & rhythm and no murmurs; No lower extremity edema ABDOMEN:abdomen soft, non-tender and normal bowel sounds Musculoskeletal:no cyanosis of digits and no clubbing  PSYCH: alert & oriented x 3 with fluent speech NEURO: no focal motor/sensory deficits SKIN:  no rashes or significant lesions  LABORATORY DATA:  I have reviewed the data as listed    Component Value Date/Time   NA 131 (L) 12/02/2015 1046   NA 132 (L) 02/02/2014 1947   K 4.3 12/02/2015 1046   K 4.3 02/02/2014 1947   CL 98 (L) 12/02/2015 1046   CL 100 02/02/2014 1947   CO2 24 12/02/2015 1046   CO2 21 02/02/2014 1947   GLUCOSE 99 12/02/2015 1046   GLUCOSE 107 (H) 02/02/2014 1947   BUN 17 12/02/2015 1046   BUN 22 (H) 02/02/2014 1947   CREATININE 0.83 12/02/2015 1046   CREATININE 0.94 02/02/2014 1947   CALCIUM 9.8 12/02/2015 1046   CALCIUM 10.2 12/08/2014 1540   PROT 7.9 12/02/2015 1046   PROT 7.7 10/21/2013 1106   ALBUMIN 3.9 12/02/2015 1046   ALBUMIN 3.3 (L) 10/21/2013 1106   AST 15 12/02/2015 1046   AST 16 10/21/2013 1106   ALT 11 (L) 12/02/2015 1046   ALT 25 10/21/2013 1106   ALKPHOS 74 12/02/2015 1046   ALKPHOS 83 10/21/2013 1106   BILITOT 0.5 12/02/2015 1046   BILITOT 0.5 10/21/2013 1106   GFRNONAA >60 12/02/2015 1046    GFRNONAA >60 02/02/2014 1947   GFRNONAA 57 (L) 10/21/2013 1106   GFRAA >60 12/02/2015 1046   GFRAA >60 02/02/2014 1947   GFRAA >60 10/21/2013 1106    No results found for: SPEP, UPEP  Lab Results  Component Value Date   WBC 7.6 12/02/2015   NEUTROABS 5.5 12/02/2015   HGB 11.3 (L) 12/02/2015   HCT 33.4 (L) 12/02/2015   MCV 80.1 12/02/2015   PLT 335 12/02/2015      Chemistry  Component Value Date/Time   NA 131 (L) 12/02/2015 1046   NA 132 (L) 02/02/2014 1947   K 4.3 12/02/2015 1046   K 4.3 02/02/2014 1947   CL 98 (L) 12/02/2015 1046   CL 100 02/02/2014 1947   CO2 24 12/02/2015 1046   CO2 21 02/02/2014 1947   BUN 17 12/02/2015 1046   BUN 22 (H) 02/02/2014 1947   CREATININE 0.83 12/02/2015 1046   CREATININE 0.94 02/02/2014 1947      Component Value Date/Time   CALCIUM 9.8 12/02/2015 1046   CALCIUM 10.2 12/08/2014 1540   ALKPHOS 74 12/02/2015 1046   ALKPHOS 83 10/21/2013 1106   AST 15 12/02/2015 1046   AST 16 10/21/2013 1106   ALT 11 (L) 12/02/2015 1046   ALT 25 10/21/2013 1106   BILITOT 0.5 12/02/2015 1046   BILITOT 0.5 10/21/2013 1106       RADIOGRAPHIC STUDIES: I have personally reviewed the radiological images as listed and agreed with the findings in the report. No results found.   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  CBC within normal limits except for mildly low hemoglobin of 11.  Monitor for now.  # s/p TKA right- Dr.Hooten.   #  Chronic neuropathy-nortryptline/ gabapentin.   # Elevated ESR- likely artifactual given- M protein in the blood. CRP   # History of breast cancer- ER/PR positive high risk/stage III. Clinically no evidence of recurrence.  # follow in one year with labs few days prior.      Cammie Sickle, MD 12/07/2015 5:56 PM

## 2016-05-16 MED ORDER — CEFAZOLIN SODIUM-DEXTROSE 2-4 GM/100ML-% IV SOLN
INTRAVENOUS | Status: AC
  Filled 2016-05-16: qty 100

## 2016-05-23 ENCOUNTER — Encounter
Admission: RE | Admit: 2016-05-23 | Discharge: 2016-05-23 | Disposition: A | Discharge status: Home / Self Care | Payer: Medicare Other | Source: Ambulatory Visit | Attending: Orthopedic Surgery | Admitting: Orthopedic Surgery

## 2016-05-23 DIAGNOSIS — I447 Left bundle-branch block, unspecified: Secondary | ICD-10-CM | POA: Diagnosis not present

## 2016-05-23 DIAGNOSIS — Z01818 Encounter for other preprocedural examination: Secondary | ICD-10-CM | POA: Diagnosis present

## 2016-05-23 DIAGNOSIS — M1712 Unilateral primary osteoarthritis, left knee: Secondary | ICD-10-CM | POA: Diagnosis not present

## 2016-05-23 DIAGNOSIS — I10 Essential (primary) hypertension: Secondary | ICD-10-CM | POA: Diagnosis not present

## 2016-05-23 DIAGNOSIS — Z01812 Encounter for preprocedural laboratory examination: Secondary | ICD-10-CM | POA: Insufficient documentation

## 2016-05-23 DIAGNOSIS — Z0183 Encounter for blood typing: Secondary | ICD-10-CM | POA: Insufficient documentation

## 2016-05-23 HISTORY — DX: Other allergic rhinitis: J30.89

## 2016-05-23 LAB — SEDIMENTATION RATE: SED RATE: 89 mm/h — AB (ref 0–30)

## 2016-05-23 LAB — COMPREHENSIVE METABOLIC PANEL
ALBUMIN: 3.8 g/dL (ref 3.5–5.0)
ALK PHOS: 82 U/L (ref 38–126)
ALT: 14 U/L (ref 14–54)
ANION GAP: 7 (ref 5–15)
AST: 16 U/L (ref 15–41)
BUN: 27 mg/dL — ABNORMAL HIGH (ref 6–20)
CALCIUM: 9.5 mg/dL (ref 8.9–10.3)
CHLORIDE: 98 mmol/L — AB (ref 101–111)
CO2: 25 mmol/L (ref 22–32)
Creatinine, Ser: 0.96 mg/dL (ref 0.44–1.00)
GFR calc non Af Amer: 54 mL/min — ABNORMAL LOW (ref >60)
GLUCOSE: 94 mg/dL (ref 65–99)
Potassium: 4.1 mmol/L (ref 3.5–5.1)
SODIUM: 130 mmol/L — AB (ref 135–145)
Total Bilirubin: 0.8 mg/dL (ref 0.3–1.2)
Total Protein: 8.2 g/dL — ABNORMAL HIGH (ref 6.5–8.1)

## 2016-05-23 LAB — TYPE AND SCREEN
ABO/RH(D): A POS
ANTIBODY SCREEN: NEGATIVE

## 2016-05-23 LAB — URINALYSIS, ROUTINE W REFLEX MICROSCOPIC
BILIRUBIN URINE: NEGATIVE
GLUCOSE, UA: NEGATIVE mg/dL
HGB URINE DIPSTICK: NEGATIVE
KETONES UR: NEGATIVE mg/dL
Leukocytes, UA: NEGATIVE
Nitrite: NEGATIVE
PROTEIN: NEGATIVE mg/dL
Specific Gravity, Urine: 1.006 (ref 1.005–1.030)
pH: 6 (ref 5.0–8.0)

## 2016-05-23 LAB — SURGICAL PCR SCREEN
MRSA, PCR: NEGATIVE
Staphylococcus aureus: NEGATIVE

## 2016-05-23 LAB — CBC
HCT: 33.9 % — ABNORMAL LOW (ref 35.0–47.0)
HEMOGLOBIN: 11.6 g/dL — AB (ref 12.0–16.0)
MCH: 28.8 pg (ref 26.0–34.0)
MCHC: 34.3 g/dL (ref 32.0–36.0)
MCV: 83.9 fL (ref 80.0–100.0)
PLATELETS: 319 10*3/uL (ref 150–440)
RBC: 4.04 MIL/uL (ref 3.80–5.20)
RDW: 14.5 % (ref 11.5–14.5)
WBC: 10.1 10*3/uL (ref 3.6–11.0)

## 2016-05-23 LAB — PROTIME-INR
INR: 1
Prothrombin Time: 13.2 seconds (ref 11.4–15.2)

## 2016-05-23 LAB — APTT: APTT: 29 s (ref 24–36)

## 2016-05-23 LAB — C-REACTIVE PROTEIN

## 2016-05-23 NOTE — Patient Instructions (Signed)
  Your procedure is scheduled on: 05/30/16 Mon Report to Same Day Surgery 2nd floor medical mall [facility] Entrance-take elevator on left to 2nd floor.  Check in with surgery information desk.) To find out your arrival time please call [phone] between 1PM - 3PM on 05/27/16 /[place]  Remember: Instructions that are not followed completely may result in serious medical risk, up to and including death, or upon the discretion of your surgeon and anesthesiologist your surgery may need to be rescheduled.    _x___ 1. Do not eat food or drink liquids after midnight. No gum chewing or hard candies.     __x__ 2. No Alcohol for 24 hours before or after surgery.   __x__3. No Smoking for 24 prior to surgery.   ____  4. Bring all medications with you on the day of surgery if instructed.    __x__ 5. Notify your doctor if there is any change in your medical condition     (cold, fever, infections).     Do not wear jewelry, make-up, hairpins, clips or nail polish.  Do not wear lotions, powders, or perfumes. You may wear deodorant.  Do not shave 48 hours prior to surgery. Men may shave face and neck.  Do not bring valuables to the hospital.    [facility] is not responsible for any belongings or valuables.               Contacts, dentures or bridgework may not be worn into surgery.  Leave your suitcase in the car. After surgery it may be brought to your room.  For patients admitted to the hospital, discharge time is determined by your treatment team.   Patients discharged the day of surgery will not be allowed to drive home.  You will need someone to drive you home and stay with you the night of your procedure.    Please read over the following fact sheets that you were given:   [facility] Preparing for Surgery and or MRSA Information   _x___ Take these medicines the morning of surgery with A SIP OF WATER:    1. carvedilol (COREG  2.gabapentin (NEURONTIN)  3.losartan (COZAAR)  4.omeprazole  (PRILOSEC)  5.  6.  ____Fleets enema or Magnesium Citrate as directed.   _x___ Use CHG Soap or sage wipes as directed on instruction sheet   ____ Use inhalers on the day of surgery and bring to hospital day of surgery  ____ Stop metformin 2 days prior to surgery    ____ Take 1/2 of usual insulin dose the night before surgery and none on the morning of           surgery.   ____ Stop Aspirin, Coumadin, Pllavix ,Eliquis, Effient, or Pradaxa  x__ Stop Anti-inflammatories such as Advil, Aleve, Ibuprofen, Motrin, Naproxen,          Naprosyn, Goodies powders or aspirin products. Ok to take Tylenol and Celebrex.   ____ Stop supplements until after surgery.    ____ Bring C-Pap to the hospital.

## 2016-05-24 LAB — URINE CULTURE
Culture: <10000 — AB
Special Requests: NORMAL

## 2016-05-29 MED ORDER — TRANEXAMIC ACID 1000 MG/10ML IV SOLN
1000.0000 mg | INTRAVENOUS | Status: DC
  Filled 2016-05-29: qty 10

## 2016-05-29 MED ORDER — CEFAZOLIN SODIUM-DEXTROSE 2-4 GM/100ML-% IV SOLN: 2.0000 g | INTRAVENOUS | Status: DC

## 2016-05-30 ENCOUNTER — Inpatient Hospital Stay
Admission: RE | Admit: 2016-05-30 | Discharge: 2016-06-01 | DRG: 470 | Disposition: A | Discharge status: Home Health | Payer: Medicare Other | Source: Ambulatory Visit | Attending: Orthopedic Surgery | Admitting: Orthopedic Surgery

## 2016-05-30 ENCOUNTER — Inpatient Hospital Stay: Payer: Medicare Other | Admitting: Anesthesiology

## 2016-05-30 ENCOUNTER — Inpatient Hospital Stay: Payer: Medicare Other

## 2016-05-30 ENCOUNTER — Encounter: Payer: Self-pay | Admitting: Orthopedic Surgery

## 2016-05-30 ENCOUNTER — Encounter
Admission: RE | Disposition: A | Discharge status: Home Health | Payer: Self-pay | Source: Ambulatory Visit | Attending: Orthopedic Surgery

## 2016-05-30 DIAGNOSIS — E785 Hyperlipidemia, unspecified: Secondary | ICD-10-CM | POA: Diagnosis present

## 2016-05-30 DIAGNOSIS — Z79899 Other long term (current) drug therapy: Secondary | ICD-10-CM

## 2016-05-30 DIAGNOSIS — D62 Acute posthemorrhagic anemia: Secondary | ICD-10-CM | POA: Diagnosis not present

## 2016-05-30 DIAGNOSIS — I10 Essential (primary) hypertension: Secondary | ICD-10-CM | POA: Diagnosis present

## 2016-05-30 DIAGNOSIS — G629 Polyneuropathy, unspecified: Secondary | ICD-10-CM | POA: Diagnosis present

## 2016-05-30 DIAGNOSIS — F419 Anxiety disorder, unspecified: Secondary | ICD-10-CM | POA: Diagnosis present

## 2016-05-30 DIAGNOSIS — M25562 Pain in left knee: Secondary | ICD-10-CM | POA: Diagnosis present

## 2016-05-30 DIAGNOSIS — M1712 Unilateral primary osteoarthritis, left knee: Secondary | ICD-10-CM | POA: Diagnosis present

## 2016-05-30 DIAGNOSIS — Z853 Personal history of malignant neoplasm of breast: Secondary | ICD-10-CM | POA: Diagnosis not present

## 2016-05-30 DIAGNOSIS — K219 Gastro-esophageal reflux disease without esophagitis: Secondary | ICD-10-CM | POA: Diagnosis present

## 2016-05-30 DIAGNOSIS — I447 Left bundle-branch block, unspecified: Secondary | ICD-10-CM | POA: Diagnosis present

## 2016-05-30 DIAGNOSIS — Z96659 Presence of unspecified artificial knee joint: Secondary | ICD-10-CM

## 2016-05-30 DIAGNOSIS — M810 Age-related osteoporosis without current pathological fracture: Secondary | ICD-10-CM | POA: Diagnosis present

## 2016-05-30 HISTORY — PX: KNEE ARTHROPLASTY: SHX992

## 2016-05-30 SURGERY — ARTHROPLASTY, KNEE, TOTAL, USING IMAGELESS COMPUTER-ASSISTED NAVIGATION
Anesthesia: Spinal | Site: Knee | Laterality: Left | Wound class: Clean

## 2016-05-30 MED ORDER — GABAPENTIN 100 MG PO CAPS
200.0000 mg | ORAL_CAPSULE | Freq: Two times a day (BID) | ORAL | Status: DC
  Administered 2016-05-30 – 2016-06-01 (×4): 200 mg via ORAL
  Filled 2016-05-30 (×4): qty 2

## 2016-05-30 MED ORDER — SODIUM CHLORIDE 0.9 % IJ SOLN
INTRAMUSCULAR | Status: AC
  Filled 2016-05-30: qty 50

## 2016-05-30 MED ORDER — FENTANYL CITRATE (PF) 100 MCG/2ML IJ SOLN
25.0000 ug | INTRAMUSCULAR | Status: DC | PRN
  Administered 2016-05-30 (×4): 25 ug via INTRAVENOUS

## 2016-05-30 MED ORDER — NORTRIPTYLINE HCL 10 MG PO CAPS
10.0000 mg | ORAL_CAPSULE | Freq: Every day | ORAL | Status: DC
  Administered 2016-05-30: 20 mg via ORAL
  Administered 2016-05-31: 10 mg via ORAL
  Filled 2016-05-30 (×2): qty 2
  Filled 2016-05-30: qty 1

## 2016-05-30 MED ORDER — ONDANSETRON HCL 4 MG/2ML IJ SOLN
INTRAMUSCULAR | Status: AC
  Filled 2016-05-30: qty 2

## 2016-05-30 MED ORDER — NEOMYCIN-POLYMYXIN B GU 40-200000 IR SOLN
Status: AC
  Filled 2016-05-30: qty 20

## 2016-05-30 MED ORDER — BUPIVACAINE HCL (PF) 0.5 % IJ SOLN
INTRAMUSCULAR | Status: AC
  Filled 2016-05-30: qty 10

## 2016-05-30 MED ORDER — PROPOFOL 10 MG/ML IV BOLUS
INTRAVENOUS | Status: DC | PRN
Start: 2016-05-30 — End: 2016-05-30
  Administered 2016-05-30 (×2): 10 mg via INTRAVENOUS
  Administered 2016-05-30: 20 mg via INTRAVENOUS

## 2016-05-30 MED ORDER — MAGNESIUM HYDROXIDE 400 MG/5ML PO SUSP
30.0000 mL | Freq: Every day | ORAL | Status: DC | PRN
  Administered 2016-05-31: 30 mL via ORAL
  Filled 2016-05-30: qty 30

## 2016-05-30 MED ORDER — CEFAZOLIN SODIUM-DEXTROSE 2-3 GM-% IV SOLR
2.0000 g | Freq: Four times a day (QID) | INTRAVENOUS | Status: AC
  Administered 2016-05-30 – 2016-05-31 (×4): 2 g via INTRAVENOUS
  Filled 2016-05-30 (×4): qty 50

## 2016-05-30 MED ORDER — LOSARTAN POTASSIUM 50 MG PO TABS
100.0000 mg | ORAL_TABLET | Freq: Every day | ORAL | Status: DC
  Administered 2016-06-01: 100 mg via ORAL
  Filled 2016-05-30 (×2): qty 2

## 2016-05-30 MED ORDER — TETRACAINE HCL 1 % IJ SOLN
INTRAMUSCULAR | Status: DC | PRN
  Administered 2016-05-30: 10 mg via INTRASPINAL

## 2016-05-30 MED ORDER — BUPIVACAINE HCL (PF) 0.5 % IJ SOLN
INTRAMUSCULAR | Status: DC | PRN
  Administered 2016-05-30: 2 mL

## 2016-05-30 MED ORDER — ALUM & MAG HYDROXIDE-SIMETH 200-200-20 MG/5ML PO SUSP: 30.0000 mL | ORAL | Status: DC | PRN

## 2016-05-30 MED ORDER — CEFAZOLIN SODIUM-DEXTROSE 2-4 GM/100ML-% IV SOLN
INTRAVENOUS | Status: AC
  Filled 2016-05-30: qty 100

## 2016-05-30 MED ORDER — FLUTICASONE PROPIONATE 50 MCG/ACT NA SUSP
2.0000 | Freq: Two times a day (BID) | NASAL | Status: DC | PRN
  Filled 2016-05-30: qty 16

## 2016-05-30 MED ORDER — MIDAZOLAM HCL 5 MG/5ML IJ SOLN
INTRAMUSCULAR | Status: DC | PRN
  Administered 2016-05-30 (×2): 1 mg via INTRAVENOUS

## 2016-05-30 MED ORDER — LACTATED RINGERS IV SOLN
INTRAVENOUS | Status: DC
  Administered 2016-05-30: 11:00:00 via INTRAVENOUS

## 2016-05-30 MED ORDER — PSYLLIUM 95 % PO PACK
1.0000 | PACK | Freq: Every day | ORAL | Status: DC
  Administered 2016-05-31 – 2016-06-01 (×2): 1 via ORAL
  Filled 2016-05-30 (×3): qty 1

## 2016-05-30 MED ORDER — SIMVASTATIN 20 MG PO TABS
40.0000 mg | ORAL_TABLET | Freq: Every day | ORAL | Status: DC
Start: 2016-05-30 — End: 2016-06-01
  Administered 2016-05-30 – 2016-05-31 (×2): 40 mg via ORAL
  Filled 2016-05-30 (×2): qty 2

## 2016-05-30 MED ORDER — SODIUM CHLORIDE 0.9 % IV SOLN
INTRAVENOUS | Status: DC | PRN
  Administered 2016-05-30: 25 ug/min via INTRAVENOUS

## 2016-05-30 MED ORDER — LORATADINE 10 MG PO TABS
10.0000 mg | ORAL_TABLET | Freq: Every day | ORAL | Status: DC
  Administered 2016-05-31 – 2016-06-01 (×2): 10 mg via ORAL
  Filled 2016-05-30 (×2): qty 1

## 2016-05-30 MED ORDER — BENEFIBER DRINK MIX PO PACK: 15.0000 mg | PACK | Freq: Every day | ORAL | Status: DC

## 2016-05-30 MED ORDER — VITAMIN D 1000 UNITS PO TABS
2000.0000 [IU] | ORAL_TABLET | Freq: Every day | ORAL | Status: DC
  Administered 2016-05-31 – 2016-06-01 (×2): 2000 [IU] via ORAL
  Filled 2016-05-30 (×2): qty 2

## 2016-05-30 MED ORDER — SODIUM CHLORIDE 0.9 % IV SOLN
INTRAVENOUS | Status: DC
  Administered 2016-05-30 – 2016-05-31 (×2): via INTRAVENOUS

## 2016-05-30 MED ORDER — TORSEMIDE 20 MG PO TABS
10.0000 mg | ORAL_TABLET | Freq: Every day | ORAL | Status: DC
  Filled 2016-05-30 (×2): qty 1

## 2016-05-30 MED ORDER — OXYCODONE HCL 5 MG PO TABS
5.0000 mg | ORAL_TABLET | ORAL | Status: DC | PRN
  Administered 2016-05-30 – 2016-06-01 (×8): 10 mg via ORAL
  Filled 2016-05-30 (×9): qty 2

## 2016-05-30 MED ORDER — CELECOXIB 200 MG PO CAPS
200.0000 mg | ORAL_CAPSULE | Freq: Two times a day (BID) | ORAL | Status: DC
  Administered 2016-05-30 – 2016-06-01 (×4): 200 mg via ORAL
  Filled 2016-05-30 (×4): qty 1

## 2016-05-30 MED ORDER — PHENYLEPHRINE HCL 10 MG/ML IJ SOLN
INTRAMUSCULAR | Status: DC | PRN
  Administered 2016-05-30: 200 ug via INTRAVENOUS
  Administered 2016-05-30: 100 ug via INTRAVENOUS

## 2016-05-30 MED ORDER — CARVEDILOL 3.125 MG PO TABS
3.1250 mg | ORAL_TABLET | Freq: Two times a day (BID) | ORAL | Status: DC
  Administered 2016-05-31 – 2016-06-01 (×3): 3.125 mg via ORAL
  Filled 2016-05-30 (×4): qty 1

## 2016-05-30 MED ORDER — FLEET ENEMA 7-19 GM/118ML RE ENEM: 1.0000 | ENEMA | Freq: Once | RECTAL | Status: DC | PRN

## 2016-05-30 MED ORDER — BUPIVACAINE HCL (PF) 0.25 % IJ SOLN
INTRAMUSCULAR | Status: AC
  Filled 2016-05-30: qty 60

## 2016-05-30 MED ORDER — BISACODYL 10 MG RE SUPP
10.0000 mg | Freq: Every day | RECTAL | Status: DC | PRN
  Administered 2016-06-01: 10 mg via RECTAL
  Filled 2016-05-30: qty 1

## 2016-05-30 MED ORDER — FENTANYL CITRATE (PF) 100 MCG/2ML IJ SOLN
INTRAMUSCULAR | Status: AC
  Filled 2016-05-30: qty 2

## 2016-05-30 MED ORDER — SENNOSIDES-DOCUSATE SODIUM 8.6-50 MG PO TABS
4.0000 | ORAL_TABLET | Freq: Every day | ORAL | Status: DC
  Administered 2016-05-30 – 2016-05-31 (×2): 4 via ORAL
  Filled 2016-05-30 (×2): qty 4

## 2016-05-30 MED ORDER — BUPIVACAINE LIPOSOME 1.3 % IJ SUSP
INTRAMUSCULAR | Status: AC
  Filled 2016-05-30: qty 20

## 2016-05-30 MED ORDER — MORPHINE SULFATE (PF) 2 MG/ML IV SOLN
2.0000 mg | INTRAVENOUS | Status: DC | PRN
  Administered 2016-05-30: 2 mg via INTRAVENOUS
  Filled 2016-05-30: qty 1

## 2016-05-30 MED ORDER — ACETAMINOPHEN 10 MG/ML IV SOLN
INTRAVENOUS | Status: DC | PRN
  Administered 2016-05-30: 1000 mg via INTRAVENOUS

## 2016-05-30 MED ORDER — LIDOCAINE HCL (PF) 2 % IJ SOLN
INTRAMUSCULAR | Status: AC
  Filled 2016-05-30: qty 2

## 2016-05-30 MED ORDER — ENOXAPARIN SODIUM 30 MG/0.3ML ~~LOC~~ SOLN
30.0000 mg | Freq: Two times a day (BID) | SUBCUTANEOUS | Status: DC
  Administered 2016-05-31 – 2016-06-01 (×3): 30 mg via SUBCUTANEOUS
  Filled 2016-05-30 (×3): qty 0.3

## 2016-05-30 MED ORDER — DIPHENHYDRAMINE HCL 12.5 MG/5ML PO ELIX: 12.5000 mg | ORAL_SOLUTION | ORAL | Status: DC | PRN

## 2016-05-30 MED ORDER — MEPERIDINE HCL 50 MG/ML IJ SOLN: 6.2500 mg | INTRAMUSCULAR | Status: DC | PRN

## 2016-05-30 MED ORDER — ONDANSETRON HCL 4 MG PO TABS: 4.0000 mg | ORAL_TABLET | Freq: Four times a day (QID) | ORAL | Status: DC | PRN

## 2016-05-30 MED ORDER — ACETAMINOPHEN 10 MG/ML IV SOLN
1000.0000 mg | Freq: Four times a day (QID) | INTRAVENOUS | Status: AC
  Administered 2016-05-30 – 2016-05-31 (×4): 1000 mg via INTRAVENOUS
  Filled 2016-05-30 (×4): qty 100

## 2016-05-30 MED ORDER — PROPOFOL 500 MG/50ML IV EMUL
INTRAVENOUS | Status: AC
  Filled 2016-05-30: qty 100

## 2016-05-30 MED ORDER — PANTOPRAZOLE SODIUM 40 MG PO TBEC
40.0000 mg | DELAYED_RELEASE_TABLET | Freq: Two times a day (BID) | ORAL | Status: DC
  Administered 2016-05-30 – 2016-06-01 (×4): 40 mg via ORAL
  Filled 2016-05-30 (×4): qty 1

## 2016-05-30 MED ORDER — CEFAZOLIN SODIUM-DEXTROSE 2-3 GM-% IV SOLR
INTRAVENOUS | Status: DC | PRN
  Administered 2016-05-30: 2 g via INTRAVENOUS

## 2016-05-30 MED ORDER — METOCLOPRAMIDE HCL 10 MG PO TABS
10.0000 mg | ORAL_TABLET | Freq: Three times a day (TID) | ORAL | Status: DC
  Administered 2016-05-30 – 2016-06-01 (×6): 10 mg via ORAL
  Filled 2016-05-30 (×6): qty 1

## 2016-05-30 MED ORDER — NEOMYCIN-POLYMYXIN B GU 40-200000 IR SOLN
Status: DC | PRN
  Administered 2016-05-30: 14 mL

## 2016-05-30 MED ORDER — MENTHOL 3 MG MT LOZG
1.0000 | LOZENGE | OROMUCOSAL | Status: DC | PRN
  Filled 2016-05-30: qty 9

## 2016-05-30 MED ORDER — PROPOFOL 500 MG/50ML IV EMUL
INTRAVENOUS | Status: DC | PRN
  Administered 2016-05-30: 50 ug/kg/min via INTRAVENOUS

## 2016-05-30 MED ORDER — TRANEXAMIC ACID 1000 MG/10ML IV SOLN
1000.0000 mg | Freq: Once | INTRAVENOUS | Status: DC
  Filled 2016-05-30: qty 10

## 2016-05-30 MED ORDER — ACETAMINOPHEN 325 MG PO TABS: 650.0000 mg | ORAL_TABLET | Freq: Four times a day (QID) | ORAL | Status: DC | PRN

## 2016-05-30 MED ORDER — BUPIVACAINE HCL (PF) 0.25 % IJ SOLN
INTRAMUSCULAR | Status: DC | PRN
Start: 2016-05-30 — End: 2016-05-30
  Administered 2016-05-30: 60 mL

## 2016-05-30 MED ORDER — ONDANSETRON HCL 4 MG/2ML IJ SOLN: 4.0000 mg | Freq: Four times a day (QID) | INTRAMUSCULAR | Status: DC | PRN

## 2016-05-30 MED ORDER — CEFAZOLIN SODIUM-DEXTROSE 2-4 GM/100ML-% IV SOLN
2.0000 g | Freq: Four times a day (QID) | INTRAVENOUS | Status: DC
  Filled 2016-05-30 (×4): qty 100

## 2016-05-30 MED ORDER — FERROUS SULFATE 325 (65 FE) MG PO TABS
325.0000 mg | ORAL_TABLET | Freq: Two times a day (BID) | ORAL | Status: DC
  Administered 2016-05-31 – 2016-06-01 (×3): 325 mg via ORAL
  Filled 2016-05-30 (×3): qty 1

## 2016-05-30 MED ORDER — CLOBETASOL PROPIONATE 0.05 % EX OINT
TOPICAL_OINTMENT | Freq: Two times a day (BID) | CUTANEOUS | Status: DC
  Filled 2016-05-30: qty 15

## 2016-05-30 MED ORDER — ACETAMINOPHEN 650 MG RE SUPP: 650.0000 mg | Freq: Four times a day (QID) | RECTAL | Status: DC | PRN

## 2016-05-30 MED ORDER — TETRACAINE HCL 1 % IJ SOLN
INTRAMUSCULAR | Status: AC
  Filled 2016-05-30: qty 2

## 2016-05-30 MED ORDER — SODIUM CHLORIDE 0.9 % IV SOLN
INTRAVENOUS | Status: DC | PRN
  Administered 2016-05-30: 60 mL

## 2016-05-30 MED ORDER — PROMETHAZINE HCL 25 MG/ML IJ SOLN: 6.2500 mg | INTRAMUSCULAR | Status: DC | PRN

## 2016-05-30 MED ORDER — SODIUM CHLORIDE 0.9 % IV SOLN
INTRAVENOUS | Status: DC | PRN
  Administered 2016-05-30: 1000 mg via INTRAVENOUS

## 2016-05-30 MED ORDER — FENTANYL CITRATE (PF) 100 MCG/2ML IJ SOLN
INTRAMUSCULAR | Status: DC | PRN
  Administered 2016-05-30 (×3): 25 ug via INTRAVENOUS

## 2016-05-30 MED ORDER — ONDANSETRON HCL 4 MG/2ML IJ SOLN
INTRAMUSCULAR | Status: DC | PRN
  Administered 2016-05-30 (×2): 4 mg via INTRAVENOUS

## 2016-05-30 MED ORDER — CHLORHEXIDINE GLUCONATE 4 % EX LIQD: 60.0000 mL | Freq: Once | CUTANEOUS | Status: DC

## 2016-05-30 MED ORDER — ACETAMINOPHEN 10 MG/ML IV SOLN
INTRAVENOUS | Status: AC
  Filled 2016-05-30: qty 100

## 2016-05-30 MED ORDER — PHENOL 1.4 % MT LIQD
1.0000 | OROMUCOSAL | Status: DC | PRN
  Filled 2016-05-30: qty 177

## 2016-05-30 MED ORDER — MIDAZOLAM HCL 2 MG/2ML IJ SOLN
INTRAMUSCULAR | Status: AC
Start: 2016-05-30 — End: 2016-05-30
  Filled 2016-05-30: qty 2

## 2016-05-30 SURGICAL SUPPLY — 60 items
BATTERY INSTRU NAVIGATION (MISCELLANEOUS) ×12 IMPLANT
BLADE SAW 1 (BLADE) ×3 IMPLANT
BLADE SAW 1/2 (BLADE) ×3 IMPLANT
BLADE SAW 70X12.5 (BLADE) IMPLANT
CANISTER SUCT 1200ML W/VALVE (MISCELLANEOUS) ×3 IMPLANT
CANISTER SUCT 3000ML (MISCELLANEOUS) ×6 IMPLANT
CAPT KNEE TOTAL 3 ATTUNE ×3 IMPLANT
CATH TRAY METER 16FR LF (MISCELLANEOUS) ×3 IMPLANT
CEMENT HV SMART SET (Cement) ×6 IMPLANT
COOLER POLAR GLACIER W/PUMP (MISCELLANEOUS) IMPLANT
CUFF TOURN 24 STER (MISCELLANEOUS) IMPLANT
CUFF TOURN 30 STER DUAL PORT (MISCELLANEOUS) ×3 IMPLANT
DRAPE SHEET LG 3/4 BI-LAMINATE (DRAPES) ×3 IMPLANT
DRSG DERMACEA 8X12 NADH (GAUZE/BANDAGES/DRESSINGS) ×3 IMPLANT
DRSG OPSITE POSTOP 4X14 (GAUZE/BANDAGES/DRESSINGS) ×3 IMPLANT
DRSG TEGADERM 4X4.75 (GAUZE/BANDAGES/DRESSINGS) ×3 IMPLANT
DURAPREP 26ML APPLICATOR (WOUND CARE) ×6 IMPLANT
ELECT CAUTERY BLADE 6.4 (BLADE) ×3 IMPLANT
ELECT REM PT RETURN 9FT ADLT (ELECTROSURGICAL) ×3
ELECTRODE REM PT RTRN 9FT ADLT (ELECTROSURGICAL) ×1 IMPLANT
EX-PIN ORTHOLOCK NAV 4X150 (PIN) ×6 IMPLANT
GLOVE BIOGEL M STRL SZ7.5 (GLOVE) ×6 IMPLANT
GLOVE BIOGEL PI IND STRL 9 (GLOVE) ×1 IMPLANT
GLOVE BIOGEL PI INDICATOR 9 (GLOVE) ×2
GLOVE INDICATOR 8.0 STRL GRN (GLOVE) ×3 IMPLANT
GLOVE SURG SYN 9.0  PF PI (GLOVE) ×2
GLOVE SURG SYN 9.0 PF PI (GLOVE) ×1 IMPLANT
GOWN STRL REUS W/ TWL LRG LVL3 (GOWN DISPOSABLE) ×2 IMPLANT
GOWN STRL REUS W/TWL 2XL LVL3 (GOWN DISPOSABLE) ×3 IMPLANT
GOWN STRL REUS W/TWL LRG LVL3 (GOWN DISPOSABLE) ×4
HANDPIECE INTERPULSE COAX TIP (DISPOSABLE) ×2
HEMOVAC 400CC 10FR (MISCELLANEOUS) ×3 IMPLANT
HOLDER FOLEY CATH W/STRAP (MISCELLANEOUS) ×3 IMPLANT
HOOD PEEL AWAY FLYTE STAYCOOL (MISCELLANEOUS) ×6 IMPLANT
KIT RM TURNOVER STRD PROC AR (KITS) ×3 IMPLANT
KNIFE SCULPS 14X20 (INSTRUMENTS) ×3 IMPLANT
LABEL OR SOLS (LABEL) ×3 IMPLANT
NDL SAFETY 18GX1.5 (NEEDLE) ×3 IMPLANT
NEEDLE SPNL 20GX3.5 QUINCKE YW (NEEDLE) ×3 IMPLANT
NS IRRIG 500ML POUR BTL (IV SOLUTION) ×3 IMPLANT
PACK TOTAL KNEE (MISCELLANEOUS) ×3 IMPLANT
PAD WRAPON POLAR KNEE (MISCELLANEOUS) ×1 IMPLANT
PIN FIXATION 1/8DIA X 3INL (PIN) ×3 IMPLANT
SET HNDPC FAN SPRY TIP SCT (DISPOSABLE) ×1 IMPLANT
SOL .9 NS 3000ML IRR  AL (IV SOLUTION) ×2
SOL .9 NS 3000ML IRR UROMATIC (IV SOLUTION) ×1 IMPLANT
SOL PREP PVP 2OZ (MISCELLANEOUS) ×3
SOLUTION PREP PVP 2OZ (MISCELLANEOUS) ×1 IMPLANT
SPONGE DRAIN TRACH 4X4 STRL 2S (GAUZE/BANDAGES/DRESSINGS) ×3 IMPLANT
STAPLER SKIN PROX 35W (STAPLE) ×3 IMPLANT
SUCTION FRAZIER HANDLE 10FR (MISCELLANEOUS) ×2
SUCTION TUBE FRAZIER 10FR DISP (MISCELLANEOUS) ×1 IMPLANT
SUT VIC AB 0 CT1 36 (SUTURE) ×3 IMPLANT
SUT VIC AB 1 CT1 36 (SUTURE) ×6 IMPLANT
SUT VIC AB 2-0 CT2 27 (SUTURE) ×3 IMPLANT
SYR 20CC LL (SYRINGE) ×3 IMPLANT
SYR 30ML LL (SYRINGE) ×6 IMPLANT
TOWEL OR 17X26 4PK STRL BLUE (TOWEL DISPOSABLE) ×3 IMPLANT
TOWER CARTRIDGE SMART MIX (DISPOSABLE) ×3 IMPLANT
WRAPON POLAR PAD KNEE (MISCELLANEOUS) ×3

## 2016-05-30 NOTE — Transfer of Care (Signed)
Immediate Anesthesia Transfer of Care Note  Patient: Carly Peck  Procedure(s) Performed: Procedure(s): COMPUTER ASSISTED TOTAL KNEE ARTHROPLASTY (Left)  Patient Location: PACU  Anesthesia Type:Spinal  Level of Consciousness: awake and alert   Airway & Oxygen Therapy: Patient connected to face mask oxygen  Post-op Assessment: Post -op Vital signs reviewed and stable  Post vital signs: stable  Last Vitals:  Vitals:   05/30/16 1005 05/30/16 1550  BP: 137/72 (!) 99/48  Pulse: 91 88  Resp: 16 14  Temp: 36.5 C 36.4 C    Last Pain:  Vitals:   05/30/16 1005  TempSrc: Oral         Complications: No apparent anesthesia complications

## 2016-05-30 NOTE — Anesthesia Procedure Notes (Signed)
Spinal  Patient location during procedure: OR Start time: 05/30/2016 12:10 PM End time: 05/30/2016 12:20 PM Staffing Anesthesiologist: Randa Lynn AMY Resident/CRNA: Johnna Acosta Performed: resident/CRNA and anesthesiologist  Preanesthetic Checklist Completed: patient identified, site marked, surgical consent, pre-op evaluation, timeout performed, IV checked, risks and benefits discussed and monitors and equipment checked Spinal Block Patient position: sitting Prep: ChloraPrep Patient monitoring: heart rate, continuous pulse ox, blood pressure and cardiac monitor Approach: midline Location: L4-5 Injection technique: single-shot Needle Needle type: Introducer and Pencil-Tip  Needle gauge: 24 G Needle length: 9 cm Additional Notes Negative paresthesia. Negative blood return. Positive free-flowing CSF. Expiration date of kit checked and confirmed. Patient tolerated procedure well, without complications.

## 2016-05-30 NOTE — Op Note (Signed)
OPERATIVE NOTE  DATE OF SURGERY:  05/30/2016  PATIENT NAME:  STEPH CHEADLE   DOB: 02-Nov-1931  MRN: [id number]  PRE-OPERATIVE DIAGNOSIS: Degenerative arthrosis of the left knee, primary  POST-OPERATIVE DIAGNOSIS:  Same  PROCEDURE:  Left total knee arthroplasty using computer-assisted navigation  SURGEON:  Marciano Sequin. M.D.  ASSISTANT:  Vance Peper, PA (present and scrubbed throughout the case, critical for assistance with exposure, retraction, instrumentation, and closure)  ANESTHESIA: spinal  ESTIMATED BLOOD LOSS: 20 mL  FLUIDS REPLACED: 1600 mL of crystalloid  TOURNIQUET TIME: 98 minutes  DRAINS: 2 medium drains to a reinfusion system  SOFT TISSUE RELEASES: Anterior cruciate ligament, posterior cruciate ligament, deep medial collateral ligament, patellofemoral ligament, and posterolateral corner  IMPLANTS UTILIZED: DePuy Attune size 5N posterior stabilized femoral component (cemented), size 5 rotating platform tibial component (cemented), 35 mm medialized dome patella (cemented), and a 5 mm stabilized rotating platform polyethylene insert.  INDICATIONS FOR SURGERY: CHRISTELLE IGOE is a 83 y.o. year old female with a long history of progressive knee pain. X-rays demonstrated severe degenerative changes in tricompartmental fashion. The patient had not seen any significant improvement despite conservative nonsurgical intervention. After discussion of the risks and benefits of surgical intervention, the patient expressed understanding of the risks benefits and agree with plans for total knee arthroplasty.   The risks, benefits, and alternatives were discussed at length including but not limited to the risks of infection, bleeding, nerve injury, stiffness, blood clots, the need for revision surgery, cardiopulmonary complications, among others, and they were willing to proceed.  PROCEDURE IN DETAIL: The patient was brought into the operating room and, after adequate spinal anesthesia  was achieved, a tourniquet was placed on the patient's upper thigh. The patient's knee and leg were cleaned and prepped with alcohol and DuraPrep and draped in the usual sterile fashion. A "timeout" was performed as per usual protocol. The lower extremity was exsanguinated using an Esmarch, and the tourniquet was inflated to 300 mmHg. An anterior longitudinal incision was made followed by a standard mid vastus approach. The deep fibers of the medial collateral ligament were elevated in a subperiosteal fashion off of the medial flare of the tibia so as to maintain a continuous soft tissue sleeve. The patella was subluxed laterally and the patellofemoral ligament was incised. Inspection of the knee demonstrated severe degenerative changes with full-thickness loss of articular cartilage. Osteophytes were debrided using a rongeur. Anterior and posterior cruciate ligaments were excised. Two 4.0 mm Schanz pins were inserted in the femur and into the tibia for attachment of the array of trackers used for computer-assisted navigation. Hip center was identified using a circumduction technique. Distal landmarks were mapped using the computer. The distal femur and proximal tibia were mapped using the computer. The distal femoral cutting guide was positioned using computer-assisted navigation so as to achieve a 5 distal valgus cut. The femur was sized and it was felt that a size 5N femoral component was appropriate. A size 5 femoral cutting guide was positioned and the anterior cut was performed and verified using the computer. This was followed by completion of the posterior and chamfer cuts. Femoral cutting guide for the central box was then positioned in the center box cut was performed.  Attention was then directed to the proximal tibia. Medial and lateral menisci were excised. The extramedullary tibial cutting guide was positioned using computer-assisted navigation so as to achieve a 0 varus-valgus alignment and 3  posterior slope. The cut was performed and verified  using the computer. The proximal tibia was sized and it was felt that a size 5 tibial tray was appropriate. Tibial and femoral trials were inserted followed by insertion of a 5 mm polyethylene insert. The knee was felt to be tight laterally. The trial components removed and the knee was brought into full extension and distracted using the [place]land retractors. The posterolateral were was carefully released using a combination of electrocautery and Metzenbaum scissors. Trial components were reinserted along with the 5 mm polyethylene trial.This allowed for excellent mediolateral soft tissue balancing both in flexion and in full extension. Finally, the patella was cut and prepared so as to accommodate a 35 mm medialized dome patella. A patella trial was placed and the knee was placed through a range of motion with excellent patellar tracking appreciated. The femoral trial was removed after debridement of posterior osteophytes. The central post-hole for the tibial component was reamed followed by insertion of a keel punch. Tibial trials were then removed. Cut surfaces of bone were irrigated with copious amounts of normal saline with antibiotic solution using pulsatile lavage and then suctioned dry. Polymethylmethacrylate cement was prepared in the usual fashion using a vacuum mixer. Cement was applied to the cut surface of the proximal tibia as well as along the undersurface of a size 5 rotating platform tibial component. Tibial component was positioned and impacted into place. Excess cement was removed using [employer] fast streamer. Cement was then applied to the cut surfaces of the femur as well as along the posterior flanges of the size 5N femoral component. The femoral component was positioned and impacted into place. Excess cement was removed using [employer] fast streamer. A 5 mm polyethylene trial was inserted and the knee was brought into full extension with steady axial  compression applied. Finally, cement was applied to the backside of a 35 mm medialized dome patella and the patellar component was positioned and patellar clamp applied. Excess cement was removed using [employer] fast streamer. After adequate curing of the cement, the tourniquet was deflated after a total tourniquet time of 98 minutes. Hemostasis was achieved using electrocautery. The knee was irrigated with copious amounts of normal saline with antibiotic solution using pulsatile lavage and then suctioned dry. 20 mL of 1.3% Exparel and 60 mL of 0.25% Marcaine in 40 mL of normal saline was injected along the posterior capsule, medial and lateral gutters, and along the arthrotomy site. A 5 mm stabilized rotating platform polyethylene insert was inserted and the knee was placed through a range of motion with excellent mediolateral soft tissue balancing appreciated and excellent patellar tracking noted. 2 medium drains were placed in the wound bed and brought out through separate stab incisions to be attached to a reinfusion system. The medial parapatellar portion of the incision was reapproximated using interrupted sutures of #1 Vicryl. Subcutaneous tissue was approximated in layers using first #0 Vicryl followed #2-0 Vicryl. The skin was approximated with skin staples. A sterile dressing was applied.  The patient tolerated the procedure well and was transported to the recovery room in stable condition.    Jessenia Filippone P. Holley Bouche., M.D.

## 2016-05-30 NOTE — Anesthesia Preprocedure Evaluation (Signed)
Anesthesia Evaluation  Patient identified by MRN, date of birth, ID band Patient awake    Reviewed: Allergy & Precautions, NPO status , Patient's Chart, lab work & pertinent test results  History of Anesthesia Complications Negative for: history of anesthetic complications  Airway Mallampati: II  TM Distance: >3 FB Neck ROM: Full    Dental no notable dental hx.    Pulmonary neg pulmonary ROS, neg sleep apnea, neg COPD,    breath sounds clear to auscultation- rhonchi (-) wheezing      Cardiovascular Exercise Tolerance: Good hypertension, Pt. on medications (-) CAD and (-) Past MI + dysrhythmias (LBBB)  Rhythm:Regular Rate:Normal - Systolic murmurs and - Diastolic murmurs    Neuro/Psych  Headaches, Anxiety    GI/Hepatic Neg liver ROS, GERD  ,  Endo/Other  negative endocrine ROSneg diabetes  Renal/GU negative Renal ROS     Musculoskeletal  (+) Arthritis ,   Abdominal (+) - obese,   Peds  Hematology negative hematology ROS (+)   Anesthesia Other Findings Past Medical History: No date: Anxiety No date: Arthritis No date: Breast cancer ([place])     Comment: left breast cancer No date: Cancer ([place]) No date: Environmental and seasonal allergies No date: GERD (gastroesophageal reflux disease) No date: History of left breast cancer No date: Hyperlipidemia No date: Hypertension No date: Left bundle branch block (LBBB) No date: Neuropathy ([place])   Reproductive/Obstetrics                             Anesthesia Physical Anesthesia Plan  ASA: II  Anesthesia Plan: Spinal   Post-op Pain Management:    Induction:   Airway Management Planned: Natural Airway  Additional Equipment:   Intra-op Plan:   Post-operative Plan:   Informed Consent: I have reviewed the patients History and Physical, chart, labs and discussed the procedure including the risks, benefits and alternatives for the  proposed anesthesia with the patient or authorized representative who has indicated his/her understanding and acceptance.   Dental advisory given  Plan Discussed with: Anesthesiologist and CRNA  Anesthesia Plan Comments:         Lab Results  Component Value Date   WBC 10.1 05/23/2016   HGB 11.6 (L) 05/23/2016   HCT 33.9 (L) 05/23/2016   MCV 83.9 05/23/2016   PLT 319 05/23/2016    Anesthesia Quick Evaluation

## 2016-05-30 NOTE — Anesthesia Post-op Follow-up Note (Cosign Needed)
Anesthesia QCDR form completed.        

## 2016-05-30 NOTE — Progress Notes (Signed)
Pt report that she is seeing floating black spots when eyes are open. Dr. Ronelle Nigh was called and notified and gave orders to call Ophthalmology on call for consult. Dr. Murvin Natal was called at [phone] and was given pt symptoms and reported that he will see pt tomorrow in her room. Pt is alert, calm, and talkative and has no s/s of distress. VS and orders assessed and will continue to monitor and tx pt according to MD orders.

## 2016-05-30 NOTE — H&P (Signed)
The patient has been re-examined, and the chart reviewed, and there have been no interval changes to the documented history and physical.    The risks, benefits, and alternatives have been discussed at length. The patient expressed understanding of the risks benefits and agreed with plans for surgical intervention.  Nico Syme P. Ayanah Snader, Jr. M.D.    

## 2016-05-30 NOTE — OR Nursing (Signed)
Per Dr. Randa Lynn when in to see pt, only need 1 IV site

## 2016-05-31 LAB — CBC
HEMATOCRIT: 26.7 % — AB (ref 35.0–47.0)
HEMOGLOBIN: 9.2 g/dL — AB (ref 12.0–16.0)
MCH: 29.3 pg (ref 26.0–34.0)
MCHC: 34.6 g/dL (ref 32.0–36.0)
MCV: 84.7 fL (ref 80.0–100.0)
Platelets: 247 10*3/uL (ref 150–440)
RBC: 3.15 MIL/uL — AB (ref 3.80–5.20)
RDW: 14.4 % (ref 11.5–14.5)
WBC: 9.4 10*3/uL (ref 3.6–11.0)

## 2016-05-31 LAB — BASIC METABOLIC PANEL
ANION GAP: 4 — AB (ref 5–15)
BUN: 12 mg/dL (ref 6–20)
CALCIUM: 8.8 mg/dL — AB (ref 8.9–10.3)
CHLORIDE: 102 mmol/L (ref 101–111)
CO2: 26 mmol/L (ref 22–32)
Creatinine, Ser: 0.61 mg/dL (ref 0.44–1.00)
GFR calc non Af Amer: >60 mL/min (ref >60)
GLUCOSE: 114 mg/dL — AB (ref 65–99)
POTASSIUM: 4.2 mmol/L (ref 3.5–5.1)
Sodium: 132 mmol/L — ABNORMAL LOW (ref 135–145)

## 2016-05-31 NOTE — Progress Notes (Signed)
Pt. Alert and oriented. VSS. Pain controlled with meds per MAR. Pt. Dangled at the bedside and tolerated well. Neurochecks WDL. Bone foam in place. IS at the bedside and pt. Instructed on use. Polarcare in place. Foley patent. Pt. Slept good throughout the night. Will continue to monitor.

## 2016-05-31 NOTE — Progress Notes (Signed)
ORTHOPAEDICS PROGRESS NOTE  PATIENT NAME: Carly Peck DOB: May 04, 1931  MRN: [id number]  POD # 1: Left total knee arthroplasty  Subjective: The patient states that pain has been well-controlled. She denies any nausea. She dangled at bedside last night as per nursing notes.  Objective: Vital signs in last 24 hours: Temp:  [97.2 F (36.2 C)-98 F (36.7 C)] 98 F (36.7 C) (03/13 0407) Pulse Rate:  [73-91] 78 (03/13 0407) Resp:  [8-20] 20 (03/13 0407) BP: (89-143)/(39-77) 118/72 (03/13 0407) SpO2:  [97 %-100 %] 98 % (03/13 0407) Weight:  [70.8 kg (156 lb)] 70.8 kg (156 lb) (03/12 1005)  Intake/Output from previous day: 03/12 0701 - 03/13 0700 In: 1000 [I.V.:1000] Out: 1865 [Urine:1635; Drains:210; Blood:20]   Recent Labs  05/31/16 0333  WBC 9.4  HGB 9.2*  HCT 26.7*  PLT 247  K 4.2  CL 102  CO2 26  BUN 12  CREATININE 0.61  GLUCOSE 114*  CALCIUM 8.8*    EXAM General: Well-developed well-nourished female seen in no acute distress. Lungs: clear to auscultation Cardiac: normal rate, regular rhythm, normal S1, S2, no murmurs, rubs, clicks or gallops Left lower extremity: Dressing to left knee is dry and intact. Bone foam is in place. The patient can perform a straight leg raise with some assistance. Homans test is negative. Neurologic: Awake, alert, and oriented. Sensory motor function are grossly intact.  Assessment: Left total knee arthroplasty  Secondary diagnoses: Acute blood loss anemia secondary to surgery Neuropathy Left bundle-branch block Hypertension Hyperlipidemia Gastroesophageal reflux disease History of breast cancer Anxiety  Plan: Today's goal were reviewed with the patient.  Begin physical therapy and occupational therapy as per total knee arthroplasty rehabilitation protocol. Repeat labs in the morning. Plan is to go Home after hospital stay. DVT Prophylaxis - Lovenox, Foot Pumps and TED hose  James P. Holley Bouche M.D.

## 2016-05-31 NOTE — NC FL2 (Signed)
[place] LEVEL OF CARE SCREENING TOOL     IDENTIFICATION  Patient Name: Carly Peck Birthdate: 1931-09-14 Sex: female Admission Date (Current Location): 05/30/2016  [place] and [place] Number:  Engineering geologist and Address:  [facility], [address], [place], [place] [address]      Provider Number: [id number]  Attending Physician Name and Address:  Dereck Leep, MD  Relative Name and Phone Number:       Current Level of Care: SNF Recommended Level of Care: [place] Prior Approval Number:    Date Approved/Denied:   PASRR Number:  [id number])  Discharge Plan: SNF    Current Diagnoses: Patient Active Problem List   Diagnosis Date Noted  . S/P total knee arthroplasty 09/14/2015  . Leg pain 04/07/2015  . Numbness and tingling 04/06/2015  . Benign essential HTN 02/24/2015  . Combined fat and carbohydrate induced hyperlipemia 02/24/2015  . Edema leg 02/09/2015  . Block, bundle branch, left 02/09/2015  . Age related osteoporosis 06/17/2014  . Macroglobulinemia of [place] ([place]) 06/17/2014  . [place]'s macroglobulinemia ([place]) 06/17/2014  . Generalized OA 11/30/2013  . HLD (hyperlipidemia) 11/30/2013  . Benign hypertension 11/30/2013  . Headache, migraine 11/30/2013  . Arthritis, degenerative 08/27/2013    Orientation RESPIRATION BLADDER Height & Weight     Self, Time, Situation, Place  Normal Continent Weight: 156 lb (70.8 kg) Height:  5' 6.5" (168.9 cm)  BEHAVIORAL SYMPTOMS/MOOD NEUROLOGICAL BOWEL NUTRITION STATUS   (None. )  (None. ) Continent Diet (Regular Diet)  AMBULATORY STATUS COMMUNICATION OF NEEDS Skin   Extensive Assist Verbally Surgical wounds (Incision: Left Knee and Right Knee)                       Personal Care Assistance Level of Assistance  Bathing, Feeding, Dressing Bathing Assistance: Limited assistance Feeding assistance: Independent Dressing Assistance: Limited  assistance     Functional Limitations Info  Sight, Hearing, Speech Sight Info: Adequate Hearing Info: Impaired Speech Info: Impaired (Missing teeth )    SPECIAL CARE FACTORS FREQUENCY  PT (By licensed PT), OT (By licensed OT)     PT Frequency:  (5) OT Frequency:  (5)            Contractures      Additional Factors Info  Code Status, Allergies Code Status Info:  (Full Code) Allergies Info:  (Adhesive Tape, Ultram Tramadol)           Current Medications (05/31/2016):  This is the current hospital active medication list Current Facility-Administered Medications  Medication Dose Route Frequency Provider Last Rate Last Dose  . 0.9 %  sodium chloride infusion   Intravenous Continuous Dereck Leep, MD 100 mL/hr at 05/31/16 0442    . acetaminophen (OFIRMEV) IV 1,000 mg  1,000 mg Intravenous Q6H Dereck Leep, MD   1,000 mg at 05/31/16 0510  . acetaminophen (TYLENOL) tablet 650 mg  650 mg Oral Q6H PRN Dereck Leep, MD       Or  . acetaminophen (TYLENOL) suppository 650 mg  650 mg Rectal Q6H PRN Dereck Leep, MD      . alum & mag hydroxide-simeth (MAALOX/MYLANTA) 200-200-20 MG/5ML suspension 30 mL  30 mL Oral Q4H PRN Dereck Leep, MD      . bisacodyl (DULCOLAX) suppository 10 mg  10 mg Rectal Daily PRN Dereck Leep, MD      . carvedilol (COREG) tablet 3.125 mg  3.125  mg Oral BID Dereck Leep, MD      . ceFAZolin (ANCEF) IVPB 2 g/50 mL premix  2 g Intravenous Q6H Dereck Leep, MD   2 g at 05/31/16 0529  . celecoxib (CELEBREX) capsule 200 mg  200 mg Oral Q12H Dereck Leep, MD   200 mg at 05/30/16 2057  . cholecalciferol (VITAMIN D) tablet 2,000 Units  2,000 Units Oral Daily Dereck Leep, MD      . clobetasol ointment (TEMOVATE) 0.05 %   Topical BID Dereck Leep, MD      . diphenhydrAMINE (BENADRYL) 12.5 MG/5ML elixir 12.5-25 mg  12.5-25 mg Oral Q4H PRN Dereck Leep, MD      . enoxaparin (LOVENOX) injection 30 mg  30 mg Subcutaneous Q12H Dereck Leep, MD       . ferrous sulfate tablet 325 mg  325 mg Oral BID WC Dereck Leep, MD      . fluticasone (FLONASE) 50 MCG/ACT nasal spray 2 spray  2 spray Each Nare BID PRN Dereck Leep, MD      . gabapentin (NEURONTIN) capsule 200 mg  200 mg Oral BID Dereck Leep, MD   200 mg at 05/30/16 2057  . loratadine (CLARITIN) tablet 10 mg  10 mg Oral Daily Dereck Leep, MD      . losartan (COZAAR) tablet 100 mg  100 mg Oral Daily Dereck Leep, MD      . magnesium hydroxide (MILK OF MAGNESIA) suspension 30 mL  30 mL Oral Daily PRN Dereck Leep, MD      . menthol-cetylpyridinium (CEPACOL) lozenge 3 mg  1 lozenge Oral PRN Dereck Leep, MD       Or  . phenol (CHLORASEPTIC) mouth spray 1 spray  1 spray Mouth/Throat PRN Dereck Leep, MD      . metoCLOPramide (REGLAN) tablet 10 mg  10 mg Oral TID AC & HS Dereck Leep, MD   10 mg at 05/30/16 2057  . morphine 2 MG/ML injection 2 mg  2 mg Intravenous Q2H PRN Dereck Leep, MD   2 mg at 05/30/16 2056  . nortriptyline (PAMELOR) capsule 10-20 mg  10-20 mg Oral QHS Dereck Leep, MD   20 mg at 05/30/16 2056  . ondansetron (ZOFRAN) tablet 4 mg  4 mg Oral Q6H PRN Dereck Leep, MD       Or  . ondansetron (ZOFRAN) injection 4 mg  4 mg Intravenous Q6H PRN Dereck Leep, MD      . oxyCODONE (Oxy IR/ROXICODONE) immediate release tablet 5-10 mg  5-10 mg Oral Q4H PRN Dereck Leep, MD   10 mg at 05/31/16 0513  . pantoprazole (PROTONIX) EC tablet 40 mg  40 mg Oral BID Dereck Leep, MD   40 mg at 05/30/16 2056  . psyllium (HYDROCIL/METAMUCIL) packet 1 packet  1 packet Oral Daily Dereck Leep, MD      . senna-docusate (Senokot-S) tablet 4 tablet  4 tablet Oral QHS Dereck Leep, MD   4 tablet at 05/30/16 2057  . simvastatin (ZOCOR) tablet 40 mg  40 mg Oral QHS Dereck Leep, MD   40 mg at 05/30/16 2056  . sodium phosphate (FLEET) 7-19 GM/118ML enema 1 enema  1 enema Rectal Once PRN Dereck Leep, MD      . torsemide (DEMADEX) tablet 10 mg  10 mg Oral Daily Dereck Leep, MD  Discharge Medications: Please see discharge summary for a list of discharge medications.  Relevant Imaging Results:  Relevant Lab Results:   Additional Information  (SSN: [id number])  Danie Chandler, Student-Social Work

## 2016-05-31 NOTE — Progress Notes (Signed)
Patient was complaining of seeing black spots after surgery yesterday. No complaining of this problem now. Dr. Wallace Going called to check on her to see if a consult was appropriate but he does not believe she needs it anymore. RN will continue to assess patient.   Deri Fuelling

## 2016-05-31 NOTE — Progress Notes (Signed)
Foley d/c'd at 0600 

## 2016-05-31 NOTE — Progress Notes (Signed)
Clinical Social Worker (CSW) received SNF consult. PT is recommending home health. RN case manager aware of above. Please reconsult if future social work needs arise. CSW signing off.   Syona Wroblewski, LCSW [phone] 

## 2016-05-31 NOTE — Care Management Note (Addendum)
Case Management Note  Patient Details  Name: Carly Peck MRN: [id number] Date of Birth: [birth year]  Subjective/Objective:   Met with patient and her spouse to discuss discharge planning. She will return home with her spouse. She has a walker she has borrowed. PCP is Dr. Frazier Richards. She prefers to use [facility]. Referral to [place] with [facility] for [place]. Pharmacy: [facility] [address]  [phone]. Called Lovenox 40 mg  #14 no refills.                  Action/Plan: [facility] for HHPT, Lovenox called in, No  DME needed   Expected Discharge Date:                  Expected Discharge Plan:  [place]  In-House Referral:     Discharge planning Services  CM Consult  Post Acute Care Choice:  Home Health Choice offered to:  Patient  DME Arranged:    DME Agency:     HH Arranged:  PT [place]:  [facility] ([facility])  Status of Service:  In process, will continue to follow  If discussed at Long Length of Stay Meetings, dates discussed:    Additional Comments:  Jolly Mango, RN 05/31/2016, 9:57 AM

## 2016-05-31 NOTE — Progress Notes (Signed)
Physical Therapy Treatment Patient Details Name: Carly Peck MRN: [id number] DOB: [birth year] Today's Date: 05/31/2016    History of Present Illness Pt is a 83 y/o F s/p L TKA.  Pt's PMH includes breast cancer, LBBB, neuropathy.      PT Comments    Carly Peck made good progress toward her therapy goals this afternoon.  She improved her L knee flexion AAROM from 75 deg this morning to 88 deg this afternoon.  She requires min guard assist for safety with transfers and when ambulating 115 ft. Pt will benefit from continued skilled PT services to increase functional independence and safety.   Follow Up Recommendations  Home health PT     Equipment Recommendations  None recommended by PT    Recommendations for Other Services       Precautions / Restrictions Precautions Precautions: Fall;Knee Precaution Booklet Issued: No Precaution Comments: Reviewed no pillow under knee Required Braces or Orthoses: Knee Immobilizer - Left Knee Immobilizer - Left: Other (comment) (if unable to perform SLR) Restrictions Weight Bearing Restrictions: Yes LLE Weight Bearing: Weight bearing as tolerated    Mobility  Bed Mobility Overal bed mobility: Needs Assistance Bed Mobility: Sit to Supine       Sit to supine: Supervision   General bed mobility comments: Pt requires increased time and effort but does not require physical assist or cues  Transfers Overall transfer level: Needs assistance Equipment used: Rolling walker (2 wheeled) Transfers: Sit to/from Stand Sit to Stand: Min guard         General transfer comment: Min guard assist for safety as pt is slow to stand with dec weight bearing through LLE, although improved since this morning's session.  Pt demonstrates safe technique for hand placement.  Well controlled descent to sit with verbal cues.  Ambulation/Gait Ambulation/Gait assistance: Min guard Ambulation Distance (Feet): 115 Feet Assistive device: Rolling walker (2  wheeled) Gait Pattern/deviations: Step-through pattern;Decreased stride length;Decreased weight shift to left;Decreased stance time - left;Decreased step length - right;Antalgic;Trunk flexed Gait velocity: decreased Gait velocity interpretation: Below normal speed for age/gender General Gait Details: Pt with slightly flexed posture and WBing heavily through RW. Cues for upright posture, forward gaze, and to relax her shoulders.  Demonstrated technique to allow for greater L knee flexion during terminal stance.   Stairs            Wheelchair Mobility    Modified Rankin (Stroke Patients Only)       Balance Overall balance assessment: Needs assistance Sitting-balance support: No upper extremity supported;Feet supported Sitting balance-Leahy Scale: Good     Standing balance support: Bilateral upper extremity supported;During functional activity Standing balance-Leahy Scale: Fair Standing balance comment: Pt able to stand statically without UE support but requires UE support for dynamic activities.                    Cognition Arousal/Alertness: Awake/alert Behavior During Therapy: WFL for tasks assessed/performed Overall Cognitive Status: Within Functional Limits for tasks assessed                      Exercises Total Joint Exercises Ankle Circles/Pumps: AROM;Both;10 reps;Seated Quad Sets: Strengthening;Both;10 reps;Seated Hip ABduction/ADduction: Strengthening;Left;10 reps;Seated Straight Leg Raises: Strengthening;Left;10 reps;Seated Long Arc Quad: Strengthening;Left;5 reps;Seated Knee Flexion: AAROM;Left;5 reps;Seated;Other (comment) (with 5 second holds in end range L knee flexion) Goniometric ROM: 88 deg L knee flexoin with AAROM in sitting Other Exercises Other Exercises: Pt additionally peformed L knee flexion in standing x5  General Comments General comments (skin integrity, edema, etc.): husband present during treatment session       Pertinent Vitals/Pain Pain Assessment: 0-10 Pain Score: 6  Pain Location: L knee Pain Descriptors / Indicators: Aching;Grimacing;Guarding Pain Intervention(s): Limited activity within patient's tolerance;Monitored during session;Premedicated before session;Repositioned;Other (comment) (polar care in place at end of session)    Home Living Family/patient expects to be discharged to:: Private residence Living Arrangements: Spouse/significant other Available Help at Discharge: Family;Available 24 hours/day Type of Home: Other(Comment) (81yo+ condo community) Home Access: Other (comment) (1 very short step)   Home Layout: One level Home Equipment: Walker - 2 wheels;Cane - single point;Shower seat - built in;Grab bars - toilet;Grab bars - tub/shower;Hand held shower head;Adaptive equipment      Prior Function Level of Independence: Independent      Comments: Pt reports she was ambualting without AD PTA and denies any falls in the past 12 months, indep with all ADL/IADL, driving, very active with volunteering, church, community   PT Goals (current goals can now be found in the care plan section) Acute Rehab PT Goals Patient Stated Goal: to return home PT Goal Formulation: With patient Time For Goal Achievement: 06/14/16 Potential to Achieve Goals: Good Progress towards PT goals: Progressing toward goals    Frequency    BID      PT Plan Current plan remains appropriate    Co-evaluation             End of Session Equipment Utilized During Treatment: Gait belt Activity Tolerance: Patient tolerated treatment well Patient left: with call bell/phone within reach;Other (comment);with family/visitor present;in bed;with bed alarm set;with SCD's reapplied (with bone foam in place and polar care on) Nurse Communication: Mobility status PT Visit Diagnosis: Pain;Difficulty in walking, not elsewhere classified (R26.2) Pain - Right/Left: Left Pain - part of body: Knee      Time: 7342-8768 PT Time Calculation (min) (ACUTE ONLY): 36 min  Charges:  $Gait Training: 8-22 mins $Therapeutic Exercise: 8-22 mins                    G Codes:       Collie Siad PT, DPT 05/31/2016, 4:16 PM

## 2016-05-31 NOTE — Anesthesia Postprocedure Evaluation (Signed)
Anesthesia Post Note  Patient: Carly Peck  Procedure(s) Performed: Procedure(s) (LRB): COMPUTER ASSISTED TOTAL KNEE ARTHROPLASTY (Left)  Patient location during evaluation: Nursing Unit Anesthesia Type: Spinal Level of consciousness: awake, awake and alert and oriented Pain management: pain level controlled Vital Signs Assessment: post-procedure vital signs reviewed and stable Respiratory status: spontaneous breathing Cardiovascular status: blood pressure returned to baseline Postop Assessment: no headache, no backache, no signs of nausea or vomiting and adequate PO intake Anesthetic complications: no     Last Vitals:  Vitals:   05/31/16 0407 05/31/16 0822  BP: 118/72 (!) 112/43  Pulse: 78 80  Resp: 20 16  Temp: 36.7 C 36.5 C    Last Pain:  Vitals:   05/31/16 0822  TempSrc: Oral  PainSc: 3     LLE Motor Response: Purposeful movement (05/31/16 0834) LLE Sensation: Pain (05/31/16 0834) RLE Motor Response: Purposeful movement (05/31/16 0834) RLE Sensation: Full sensation (05/31/16 0834)      Hedda Slade

## 2016-05-31 NOTE — Evaluation (Signed)
Physical Therapy Evaluation Patient Details Name: Carly Peck MRN: [id number] DOB: Oct 06, 1931 Today's Date: 05/31/2016   History of Present Illness  Pt is a 83 y/o F s/p L TKA.  Pt's PMH includes breast cancer, LBBB, neuropathy.    Clinical Impression  Pt is s/p L TKA resulting in the deficits listed below (see PT Problem List). Carly Peck was Ind PTA ambulating without AD and denies any falls in the past 6 months.  She currently requires min guard assist for sit<>stand transfers and short distance ambulation (50 ft) which was limited due to fatigue.  She has one short step to enter her condo.  Her husband will be available to provide 24/7 assist/supervision at d/c if needed. Pt will benefit from skilled PT to increase their independence and safety with mobility to allow discharge to the venue listed below.     Follow Up Recommendations Home health PT    Equipment Recommendations  None recommended by PT    Recommendations for Other Services OT consult     Precautions / Restrictions Precautions Precautions: Fall;Knee Precaution Booklet Issued: No Precaution Comments: Pt instructed in no pillow under knee Required Braces or Orthoses: Knee Immobilizer - Left Knee Immobilizer - Left: Other (comment) (if unable to perform SLR) Restrictions Weight Bearing Restrictions: Yes LLE Weight Bearing: Weight bearing as tolerated      Mobility  Bed Mobility Overal bed mobility: Needs Assistance Bed Mobility: Supine to Sit     Supine to sit: Supervision;HOB elevated     General bed mobility comments: Pt requires increased time and effort with HOB elevated but does not require physical assist or cues  Transfers Overall transfer level: Needs assistance Equipment used: Rolling walker (2 wheeled) Transfers: Sit to/from Stand Sit to Stand: Min guard         General transfer comment: Min guard assist for safety as pt is slow to stand with dec weight bearing through LLE.  Pt demonstrates  safe technique for hand placement.  Poorly controlled descent to sit.  Ambulation/Gait Ambulation/Gait assistance: Min guard Ambulation Distance (Feet): 50 Feet Assistive device: Rolling walker (2 wheeled) Gait Pattern/deviations: Step-through pattern;Step-to pattern;Decreased stride length;Decreased weight shift to left;Decreased stance time - left;Decreased step length - right;Antalgic;Trunk flexed Gait velocity: decreased Gait velocity interpretation: Below normal speed for age/gender General Gait Details: Pt transitions quickly to step through gait pattern without cues needed. Pt WBing heavily through UEs on RW and reports BUE fatigue.  Cues to relax shoulders/arms and for upright posture to avoid leaning on RW in a flexed position.  Stairs            Wheelchair Mobility    Modified Rankin (Stroke Patients Only)       Balance Overall balance assessment: Needs assistance Sitting-balance support: No upper extremity supported;Feet supported Sitting balance-Leahy Scale: Good     Standing balance support: Bilateral upper extremity supported;During functional activity Standing balance-Leahy Scale: Poor Standing balance comment: Pt relies on RW for static and dynamic activities                             Pertinent Vitals/Pain Pain Assessment: 0-10 Pain Score: 5  Pain Location: L knee Pain Descriptors / Indicators: Aching;Grimacing;Guarding Pain Intervention(s): Limited activity within patient's tolerance;Monitored during session;Repositioned;Other (comment);Patient requesting pain meds-RN notified (Polar care in place at end of session)    Home Living Family/patient expects to be discharged to:: Private residence Living Arrangements: Spouse/significant other Available Help  at Discharge: Family;Available 24 hours/day Type of Home: Other(Comment) (Condo) Home Access: Other (comment) (1 very short step)     Home Layout: One level Home Equipment: Walker - 2  wheels;Cane - single point;Shower seat - built in;Grab bars - toilet;Grab bars - tub/shower      Prior Function Level of Independence: Independent         Comments: Pt reports she was ambualting without AD PTA and denies any falls in the past 6 months.     Hand Dominance        Extremity/Trunk Assessment   Upper Extremity Assessment Upper Extremity Assessment: Defer to OT evaluation    Lower Extremity Assessment Lower Extremity Assessment: LLE deficits/detail LLE Deficits / Details: Limited strength and ROM as expected s/p L TKA.  Strength grossly at least 3/5 LLE Sensation: history of peripheral neuropathy       Communication   Communication: No difficulties  Cognition Arousal/Alertness: Awake/alert Behavior During Therapy: WFL for tasks assessed/performed Overall Cognitive Status: Within Functional Limits for tasks assessed                      General Comments General comments (skin integrity, edema, etc.): Husband present during evaluation    Exercises Total Joint Exercises Ankle Circles/Pumps: AROM;Both;10 reps;Supine Quad Sets: Strengthening;Both;10 reps;Supine Straight Leg Raises: AAROM;AROM;Strengthening;Left;10 reps;Supine (Pt able to achieve SLR without assist) Long Arc Quad: Strengthening;AROM;Left;5 reps;Seated Knee Flexion: AAROM;Left;5 reps;Seated;Other (comment) (with 5 second holds in end range L knee flexion) Goniometric ROM: 75 deg L knee flexion with AAROM in sitting   Assessment/Plan    PT Assessment Patient needs continued PT services  PT Problem List Decreased strength;Decreased range of motion;Decreased activity tolerance;Decreased balance;Decreased mobility;Decreased knowledge of use of DME;Decreased safety awareness;Pain;Decreased knowledge of precautions;Impaired sensation       PT Treatment Interventions DME instruction;Gait training;Stair training;Functional mobility training;Therapeutic activities;Therapeutic exercise;Balance  training;Neuromuscular re-education;Patient/family education;Modalities    PT Goals (Current goals can be found in the Care Plan section)  Acute Rehab PT Goals Patient Stated Goal: to return home PT Goal Formulation: With patient Time For Goal Achievement: 06/14/16 Potential to Achieve Goals: Good    Frequency BID   Barriers to discharge        Co-evaluation               End of Session Equipment Utilized During Treatment: Gait belt Activity Tolerance: Patient limited by fatigue Patient left: in chair;with call bell/phone within reach;with chair alarm set;Other (comment);with family/visitor present (with bone foam in place and polar care on) Nurse Communication: Mobility status;Patient requests pain meds;Weight bearing status PT Visit Diagnosis: Pain;Difficulty in walking, not elsewhere classified (R26.2) Pain - Right/Left: Left Pain - part of body: Knee         Time: 5400-8676 PT Time Calculation (min) (ACUTE ONLY): 33 min   Charges:   PT Evaluation $PT Eval Low Complexity: 1 Procedure PT Treatments $Therapeutic Exercise: 8-22 mins   PT G Codes:         Collie Siad PT, DPT 05/31/2016, 9:38 AM

## 2016-05-31 NOTE — Evaluation (Signed)
Occupational Therapy Evaluation Patient Details Name: Carly Peck MRN: [id number] DOB: 27-Feb-1932 Today's Date: 05/31/2016    History of Present Illness Pt is a 83 y/o F s/p L TKA.  Pt's PMH includes breast cancer, LBBB, neuropathy, R TKA   Clinical Impression   Pt seen for OT evaluation this date. Pt presents with decreased ROM/strength/activity tolerance, pain, and increased need for assistance for functional mobility and self care tasks s/p L TKA. Pt lives in 81yo+ community with good home set up for living in place. Pt and spouse are very active in the community and eager to return to PLOF (indep with ADL/IADL, driving at baseline). Pt will benefit from skilled OT services to address noted impairments and functional deficits in order to maximize return to PLOF and minimize falls risk including ECS, AE for LB ADL tasks, and toilet transfers. Pt is good candidate for HHOT upon d/c.     Follow Up Recommendations  Home health OT    Equipment Recommendations  3 in 1 bedside commode    Recommendations for Other Services       Precautions / Restrictions Precautions Precautions: Fall;Knee Precaution Booklet Issued: No Required Braces or Orthoses: Knee Immobilizer - Left Knee Immobilizer - Left: Other (comment) (if unable to perform SLR) Restrictions Weight Bearing Restrictions: Yes LLE Weight Bearing: Weight bearing as tolerated      Mobility Bed Mobility               General bed mobility comments: did not assess-pt up in recliner at start of session  Transfers Overall transfer level: Needs assistance Equipment used: Rolling walker (2 wheeled) Transfers: Sit to/from Stand Sit to Stand: Min guard         General transfer comment: verbal cues for hand placement and to attend to functional mobility, as pt was very conversational and slightly distracted at times    Balance Overall balance assessment: Needs assistance Sitting-balance support: No upper extremity  supported;Feet supported Sitting balance-Leahy Scale: Good     Standing balance support: Bilateral upper extremity supported;During functional activity Standing balance-Leahy Scale: Fair                              ADL Overall ADL's : Needs assistance/impaired Eating/Feeding: Sitting;Set up   Grooming: Set up;Sitting   Upper Body Bathing: Sitting;Set up   Lower Body Bathing: Minimal assistance;Sit to/from stand   Upper Body Dressing : Set up;Sitting   Lower Body Dressing: Minimal assistance;Sit to/from stand;Cueing for safety;Cueing for compensatory techniques   Toilet Transfer: Min guard;Set up;BSC;RW;Cueing for safety Toilet Transfer Details (indicate cue type and reason): verbal cues for hand placement for safety Toileting- Clothing Manipulation and Hygiene: Min guard;Sit to/from stand       Functional mobility during ADLs: Min guard;Cueing for safety;Rolling walker General ADL Comments: generally min A for LB ADL, cueing for hand placement during functional tasks to maximize safety/minimize falls risk     Vision Baseline Vision/History: Wears glasses Wears Glasses: Reading only Patient Visual Report: No change from baseline Vision Assessment?: No apparent visual deficits     Perception     Praxis Praxis Praxis tested?: Within functional limits    Pertinent Vitals/Pain Pain Assessment: 0-10 Pain Score: 8  Pain Location: L hip while seated in recliner Pain Descriptors / Indicators: Aching Pain Intervention(s): Limited activity within patient's tolerance;Monitored during session;Premedicated before session;Repositioned;Ice applied     Hand Dominance Right   Extremity/Trunk Assessment Upper  Extremity Assessment Upper Extremity Assessment: Generalized weakness;RUE deficits/detail;LUE deficits/detail RUE Deficits / Details: grossly 4-/5 ROM WFL LUE Deficits / Details: grossly 4/5 ROM WFL   Lower Extremity Assessment Lower Extremity Assessment:  Defer to PT evaluation   Cervical / Trunk Assessment Cervical / Trunk Assessment: Normal   Communication Communication Communication: No difficulties   Cognition Arousal/Alertness: Awake/alert Behavior During Therapy: WFL for tasks assessed/performed Overall Cognitive Status: Within Functional Limits for tasks assessed                     General Comments  husband present during evaluation    Exercises   Other Exercises Other Exercises: pt educated in body positioning and minimizing risk of syncope/OH during functional tasks, pt verbalized understanding   Shoulder Instructions      Home Living Family/patient expects to be discharged to:: Private residence Living Arrangements: Spouse/significant other Available Help at Discharge: Family;Available 24 hours/day Type of Home: Other(Comment) (81yo+ condo community) Home Access: Other (comment) (1 very short step)     Home Layout: One level     Bathroom Shower/Tub: Occupational psychologist: Handicapped height (main toilet is comfort height, other regular height toilet) Bathroom Accessibility: Yes How Accessible: Accessible via wheelchair;Accessible via walker Home Equipment: [place] - 2 wheels;Cane - single point;Shower seat - built in;Grab bars - toilet;Grab bars - tub/shower;Hand held shower head;Adaptive equipment Adaptive Equipment: Reacher;Sock aid        Prior Functioning/Environment Level of Independence: Independent        Comments: Pt reports she was ambualting without AD PTA and denies any falls in the past 12 months, indep with all ADL/IADL, driving, very active with volunteering, church, community        OT Problem List: Decreased strength;Pain;Decreased range of motion;Decreased activity tolerance;Decreased knowledge of use of DME or AE;Decreased safety awareness      OT Treatment/Interventions: Self-care/ADL training;Therapeutic exercise;Therapeutic activities;Energy conservation;DME  and/or AE instruction;Patient/family education    OT Goals(Current goals can be found in the care plan section) Acute Rehab OT Goals Patient Stated Goal: to return home OT Goal Formulation: With patient/family Time For Goal Achievement: 06/14/16 Potential to Achieve Goals: Good ADL Goals Pt Will Perform Lower Body Dressing: with set-up;with supervision;sit to/from stand;with adaptive equipment Pt Will Transfer to Toilet: ambulating;with supervision (elevated toilet in bathroom, using RW for ambulation)  OT Frequency: Min 1X/week   Barriers to D/C:            Co-evaluation              End of Session Equipment Utilized During Treatment: Gait belt;Rolling walker  Activity Tolerance: Patient tolerated treatment well Patient left: in chair;with call bell/phone within reach;with chair alarm set;with family/visitor present;Other (comment) (polar care reapplied)  OT Visit Diagnosis: Other abnormalities of gait and mobility (R26.89);Muscle weakness (generalized) (M62.81);Pain Pain - Right/Left: Left Pain - part of body: Hip;Knee                ADL either performed or assessed with clinical judgement  Time: 7915-0569 OT Time Calculation (min): 57 min Charges:  OT General Charges $OT Visit: 1 Procedure OT Evaluation $OT Eval Low Complexity: 1 Procedure OT Treatments $Self Care/Home Management : 38-52 mins G-Codes:     Jeni Salles, MPH, MS, OTR/L ascom [phone] 05/31/16, 1:53 PM

## 2016-06-01 ENCOUNTER — Encounter: Payer: Self-pay | Admitting: Orthopedic Surgery

## 2016-06-01 LAB — CBC
HCT: 26.3 % — ABNORMAL LOW (ref 35.0–47.0)
Hemoglobin: 9.1 g/dL — ABNORMAL LOW (ref 12.0–16.0)
MCH: 28.8 pg (ref 26.0–34.0)
MCHC: 34.6 g/dL (ref 32.0–36.0)
MCV: 83.2 fL (ref 80.0–100.0)
Platelets: 221 10*3/uL (ref 150–440)
RBC: 3.16 MIL/uL — ABNORMAL LOW (ref 3.80–5.20)
RDW: 14.6 % — AB (ref 11.5–14.5)
WBC: 8.5 10*3/uL (ref 3.6–11.0)

## 2016-06-01 LAB — BASIC METABOLIC PANEL
Anion gap: 5 (ref 5–15)
BUN: 12 mg/dL (ref 6–20)
CALCIUM: 9.1 mg/dL (ref 8.9–10.3)
CO2: 27 mmol/L (ref 22–32)
CREATININE: 0.82 mg/dL (ref 0.44–1.00)
Chloride: 102 mmol/L (ref 101–111)
Glucose, Bld: 126 mg/dL — ABNORMAL HIGH (ref 65–99)
Potassium: 3.9 mmol/L (ref 3.5–5.1)
SODIUM: 134 mmol/L — AB (ref 135–145)

## 2016-06-01 MED ORDER — LACTULOSE 10 GM/15ML PO SOLN
10.0000 g | Freq: Two times a day (BID) | ORAL | Status: DC | PRN
  Filled 2016-06-01: qty 30

## 2016-06-01 MED ORDER — OXYCODONE HCL 5 MG PO TABS: 5.0000 mg | ORAL_TABLET | ORAL | 0 refills | Status: DC | PRN

## 2016-06-01 MED ORDER — ENOXAPARIN SODIUM 40 MG/0.4ML ~~LOC~~ SOLN: 40.0000 mg | SUBCUTANEOUS | 0 refills | Status: DC

## 2016-06-01 NOTE — Care Management (Addendum)
Spoke with patient by phone to determine if discharge planning was in place according to patient. She will ask nurse to let spouse administer Lovenox prior to discharging- she states they "have only been shown once" probably due to short stay. She denies need for bedside commode. She states she has a front wheeled walker available. Lovenox price pending [facility] opening at 0900. RNCM will find out price and notify patient. [facility] notified of patient discharge. Lovenox cost $10. Patient updated. No other RNCM needs case closed.

## 2016-06-01 NOTE — Progress Notes (Signed)
Physical Therapy Treatment Patient Details Name: Carly Peck MRN: [id number] DOB: [birth year] Today's Date: 06/01/2016    History of Present Illness Pt is a 83 y/o F s/p L TKA.  Pt's PMH includes breast cancer, LBBB, neuropathy.      PT Comments    Pt progressing well towards goal but continues to present with deficits in strength, L knee ROM, gait, and activity tolerance.   Pt SBA with bed mobility tasks with extra time and effort required.  Pt SBA with transfers with improved use of LLE to assist during sit to/from stand.  Pt able to amb around nursing station this session with SBA and RW with one short standing rest break.  Pt ambulates with a slow cadence and with decreased R step length secondary to 8/10 pain to L knee, nursing notified for pain control.  Pt will benefit from PT services to address above deficits for decreased caregiver assistance upon discharge.    Follow Up Recommendations  Home health PT     Equipment Recommendations  None recommended by PT    Recommendations for Other Services       Precautions / Restrictions Precautions Precautions: Fall;Knee Precaution Booklet Issued: No Precaution Comments: no KI needed during OT session this date Required Braces or Orthoses:  (Pt able to do Ind LLE SLR without ext lag) Knee Immobilizer - Left: Other (comment) (Pt able to perform Ind LLE SLR without extensor lag) Restrictions Weight Bearing Restrictions: Yes LLE Weight Bearing: Weight bearing as tolerated    Mobility  Bed Mobility Overal bed mobility: Needs Assistance Bed Mobility: Supine to Sit;Sit to Supine     Supine to sit: Supervision Sit to supine: Supervision   General bed mobility comments: Pt requires increased time and effort but does not require physical assist or cues  Transfers Overall transfer level: Needs assistance Equipment used: Rolling walker (2 wheeled) Transfers: Sit to/from Stand Sit to Stand: Supervision         General  transfer comment: Improved performance during transfers with increased use of LLE during sit to/from stand  Ambulation/Gait Ambulation/Gait assistance: Supervision Ambulation Distance (Feet): 200 Feet Assistive device: Rolling walker (2 wheeled) Gait Pattern/deviations: Decreased step length - right;Antalgic   Gait velocity interpretation: Below normal speed for age/gender General Gait Details: Slow cadence with amb with slight antalgic gait pattern on LLE but steady without LOB   Stairs            Wheelchair Mobility    Modified Rankin (Stroke Patients Only)       Balance Overall balance assessment: Needs assistance Sitting-balance support: No upper extremity supported Sitting balance-Leahy Scale: Normal     Standing balance support: Bilateral upper extremity supported Standing balance-Leahy Scale: Good                      Cognition Arousal/Alertness: Awake/alert Behavior During Therapy: WFL for tasks assessed/performed Overall Cognitive Status: Within Functional Limits for tasks assessed                 General Comments: pt and spouse reported concern/anxious about returning home today and spouse being able to safely provide pt with Lovenox shots; spouse reports having watched them given at the hospital but has not been educated/trained - RN notified for education prior to discharge    Exercises Total Joint Exercises Ankle Circles/Pumps: AROM;Both;5 reps;10 reps Quad Sets: Strengthening;Left;5 reps;10 reps Hip ABduction/ADduction: AAROM;Left;10 reps Straight Leg Raises: AROM;AAROM;Left;5 reps;10 reps Long Arc Quad: AROM;Left;10 reps;15  reps Knee Flexion: AROM;Left;10 reps;15 reps Goniometric ROM: L knee A/AAROM: flex 73/80 deg limited by pain, ext -3/-3 deg limited by pain Other Exercises Other Exercises: HEP education/review for L knee QS and seated knee flex Other Exercises: Positioning education provided to pt and spouse    General  Comments General comments (skin integrity, edema, etc.): husband present during session      Pertinent Vitals/Pain Pain Assessment: 0-10 Pain Score: 6  Pain Location: L knee Pain Descriptors / Indicators: Aching Pain Intervention(s): Monitored during session;Limited activity within patient's tolerance;Patient requesting pain meds-RN notified    Home Living                      Prior Function            PT Goals (current goals can now be found in the care plan section) Acute Rehab PT Goals Patient Stated Goal: to return home Progress towards PT goals: Progressing toward goals    Frequency    BID      PT Plan Current plan remains appropriate    Co-evaluation             End of Session Equipment Utilized During Treatment: Gait belt Activity Tolerance: Patient tolerated treatment well Patient left: with call bell/phone within reach;with family/visitor present;in bed;with bed alarm set;Other (comment) (Nursing stated would assist with TED hose to LLE and SCDs at end of session.) Nurse Communication: Mobility status       Time: 2683 (Treatment interrupted by nursing for bandaging needs, returned to finish session 11:40-12:03)-1040 PT Time Calculation (min) (ACUTE ONLY): 25 min  Charges:  $Gait Training: 8-22 mins $Therapeutic Exercise: 8-22 mins $Therapeutic Activity: 8-22 mins                    G Codes:       DRoyetta Asal PT, DPT 06/01/16, 12:26 PM

## 2016-06-01 NOTE — Discharge Summary (Signed)
Physician Discharge Summary  Patient ID: Carly Peck MRN: [id number] DOB/AGE: 81-Sep-1933 81 y.o.  Admit date: 05/30/2016 Discharge date: 06/01/2016  Admission Diagnoses:  primary osteoarthritis of left knee   Discharge Diagnoses: Patient Active Problem List   Diagnosis Date Noted  . S/P total knee arthroplasty 09/14/2015  . Leg pain 04/07/2015  . Numbness and tingling 04/06/2015  . Benign essential HTN 02/24/2015  . Combined fat and carbohydrate induced hyperlipemia 02/24/2015  . Edema leg 02/09/2015  . Block, bundle branch, left 02/09/2015  . Age related osteoporosis 06/17/2014  . Macroglobulinemia of [place] (Carly Peck) 06/17/2014  . [place]'s macroglobulinemia (Carly Peck) 06/17/2014  . Generalized OA 11/30/2013  . HLD (hyperlipidemia) 11/30/2013  . Benign hypertension 11/30/2013  . Headache, migraine 11/30/2013  . Arthritis, degenerative 08/27/2013    Past Medical History:  Diagnosis Date  . Anxiety   . Arthritis   . Breast cancer ([name])    left breast cancer  . Cancer ([place])   . Environmental and seasonal allergies   . GERD (gastroesophageal reflux disease)   . History of left breast cancer   . Hyperlipidemia   . Hypertension   . Left bundle branch block (LBBB)   . Neuropathy ([name])      Transfusion: No transfusions during this admission   Consultants (if any):  none  Discharged Condition: Improved  Hospital Course: Carly Peck is an 83 y.o. female who was admitted 05/30/2016 with a diagnosis of degenerative arthrosis left knee and went to the operating room on 05/30/2016 and underwent the above named procedures.    Surgeries:Procedure(s): COMPUTER ASSISTED TOTAL KNEE ARTHROPLASTY on 05/30/2016  PRE-OPERATIVE DIAGNOSIS: Degenerative arthrosis of the left knee, primary  POST-OPERATIVE DIAGNOSIS:  Same  PROCEDURE:  Left total knee arthroplasty using computer-assisted navigation  SURGEON:  Marciano Sequin. M.D.  ASSISTANT:  Vance Peper, PA (present  and scrubbed throughout the case, critical for assistance with exposure, retraction, instrumentation, and closure)  ANESTHESIA: spinal  ESTIMATED BLOOD LOSS: 20 mL  FLUIDS REPLACED: 1600 mL of crystalloid  TOURNIQUET TIME: 98 minutes  DRAINS: 2 medium drains to a reinfusion system  SOFT TISSUE RELEASES: Anterior cruciate ligament, posterior cruciate ligament, deep medial collateral ligament, patellofemoral ligament, and posterolateral corner  IMPLANTS UTILIZED: DePuy Attune size 5N posterior stabilized femoral component (cemented), size 5 rotating platform tibial component (cemented), 35 mm medialized dome patella (cemented), and a 5 mm stabilized rotating platform polyethylene insert.  INDICATIONS FOR SURGERY: Carly Peck is a 83 y.o. year old female with a long history of progressive knee pain. X-rays demonstrated severe degenerative changes in tricompartmental fashion. The patient had not seen any significant improvement despite conservative nonsurgical intervention. After discussion of the risks and benefits of surgical intervention, the patient expressed understanding of the risks benefits and agree with plans for total knee arthroplasty.   The risks, benefits, and alternatives were discussed at length including but not limited to the risks of infection, bleeding, nerve injury, stiffness, blood clots, the need for revision surgery, cardiopulmonary complications, among others, and they were willing to proceed.  Patient tolerated the surgery well. No complications .Patient was taken to PACU where she was stabilized and then transferred to the orthopedic floor.  Patient started on Lovenox 30 mg q 12 hrs. Foot pumps applied bilaterally at 80 mm hgb. Heels elevated off bed with rolled towels. No evidence of DVT. Calves non tender. Negative [place]. Physical therapy started on day #1 for gait training and transfer with OT starting on  day #  1 for ADL and assisted devices. Patient has  done well with therapy. Ambulated greater than 200 feet upon being discharged. Was able to ascend and descend 4 steps safely and independently  Patient's IV and Foley were discontinued on day #1 with Hemovac being discontinued on day #2. Dressing change prior to patient being discharged She was given perioperative antibiotics:  Anti-infectives    Start     Dose/Rate Route Frequency Ordered Stop   05/30/16 1900  ceFAZolin (ANCEF) IVPB 2g/100 mL premix  Status:  Discontinued     2 g 200 mL/hr over 30 Minutes Intravenous Every 6 hours 05/30/16 1801 05/30/16 1824   05/30/16 1900  ceFAZolin (ANCEF) IVPB 2 g/50 mL premix     2 g 100 mL/hr over 30 Minutes Intravenous Every 6 hours 05/30/16 1825 05/31/16 1227   05/30/16 1029  ceFAZolin (ANCEF) 2-4 GM/100ML-% IVPB    Comments:  Carly Peck: cabinet override      05/30/16 1029 05/30/16 2229   05/30/16 0600  ceFAZolin (ANCEF) IVPB 2g/100 mL premix  Status:  Discontinued     2 g 200 mL/hr over 30 Minutes Intravenous On call to O.R. 05/29/16 2214 05/30/16 1004   05/16/16 0905  ceFAZolin (ANCEF) 2-4 GM/100ML-% IVPB    Comments:  Carly Peck, Carly Peck: cabinet override      05/16/16 0905 05/16/16 2114    .  She wasFitted with AV 1 compression foot pump devices, instructed on heel pumps, early ambulation, and TED stockings bilaterally for DVT prophylaxis.  She benefited maximally from the hospital stay and there were no complications.    Recent vital signs:  Vitals:   05/31/16 1954 06/01/16 0414  BP: (!) 100/43 (!) 126/53  Pulse: 74 80  Resp: 18 19  Temp: 97.5 F (36.4 C) 98.2 F (36.8 C)    Recent laboratory studies:  Lab Results  Component Value Date   HGB 9.1 (L) 06/01/2016   HGB 9.2 (L) 05/31/2016   HGB 11.6 (L) 05/23/2016   Lab Results  Component Value Date   WBC 8.5 06/01/2016   PLT 221 06/01/2016   Lab Results  Component Value Date   INR 1.00 05/23/2016   Lab Results  Component Value Date   NA 134 (L) 06/01/2016   K 3.9  06/01/2016   CL 102 06/01/2016   CO2 27 06/01/2016   BUN 12 06/01/2016   CREATININE 0.82 06/01/2016   GLUCOSE 126 (H) 06/01/2016    Discharge Medications:   Allergies as of 06/01/2016      Reactions   Adhesive [tape] Other (See Comments)   Red mark, Please use "paper" tape   Ultram [tramadol] Nausea Only   Pt states that it causes her to almost faint.       Medication List    TAKE these medications   acetaminophen 500 MG tablet Commonly known as:  TYLENOL Take 1,000 mg by mouth 2 (two) times daily as needed for mild pain or headache.   BENEFIBER DRINK MIX PO Take 15 mg by mouth daily. In coffee in mornings   carvedilol 3.125 MG tablet Commonly known as:  COREG Take 3.125 mg by mouth 2 (two) times daily.   celecoxib 200 MG capsule Commonly known as:  CELEBREX Take 200 mg by mouth daily.   cetirizine 10 MG tablet Commonly known as:  ZYRTEC Take 10 mg by mouth daily.   clobetasol ointment 0.05 % Commonly known as:  TEMOVATE APPLY TO AFFECTED AREA DAILY AS NEEDED for rash on buttocks  enoxaparin 40 MG/0.4ML injection Commonly known as:  LOVENOX Inject 0.4 mLs (40 mg total) into the skin daily.   fluticasone 50 MCG/ACT nasal spray Commonly known as:  FLONASE Place 2 sprays into both nostrils 2 (two) times daily as needed for rhinitis.   gabapentin 100 MG capsule Commonly known as:  NEURONTIN Take 200 mg by mouth 2 (two) times daily.   losartan 100 MG tablet Commonly known as:  COZAAR Take 100 mg by mouth daily.   nortriptyline 10 MG capsule Commonly known as:  PAMELOR Take 10-20 mg by mouth at bedtime. Depends on pain and difficulty sleeping  if takes 1-2 tablets   omeprazole 20 MG capsule Commonly known as:  PRILOSEC Take 20 mg by mouth daily.   oxyCODONE 5 MG immediate release tablet Commonly known as:  Oxy IR/ROXICODONE Take 1-2 tablets (5-10 mg total) by mouth every 4 (four) hours as needed for severe pain or breakthrough pain.    sennosides-docusate sodium 8.6-50 MG tablet Commonly known as:  SENOKOT-S Take 4 tablets by mouth at bedtime.   simvastatin 40 MG tablet Commonly known as:  ZOCOR Take 40 mg by mouth at bedtime.   torsemide 10 MG tablet Commonly known as:  DEMADEX Take 10 mg by mouth daily.   Vitamin D3 2000 units Tabs Take 2,000 Units by mouth daily.            Durable Medical Equipment        Start     Ordered   05/30/16 1759  DME Walker rolling  Once    Question:  Patient needs a walker to treat with the following condition  Answer:  Total knee replacement status   05/30/16 1801   05/30/16 1759  DME Bedside commode  Once    Question:  Patient needs a bedside commode to treat with the following condition  Answer:  Total knee replacement status   05/30/16 1801      Diagnostic Studies: Dg Knee Left Port  Result Date: 05/30/2016 CLINICAL DATA:  Left total knee arthroplasty EXAM: PORTABLE LEFT KNEE - 1-2 VIEW COMPARISON:  None. FINDINGS: Status post left total knee arthroplasty, with well-positioned left distal femoral and left proximal tibial prostheses. No fracture or dislocation. No suspicious focal osseous lesion. Small superior left patellar enthesophyte. Expected postsurgical gas within and surrounding the left knee joint anteriorly. Surgical drain terminates over the suprapatellar left knee joint. Skin staples overlie the left knee anteriorly. IMPRESSION: Satisfactory immediate postoperative appearance status post left total knee arthroplasty. Electronically Signed   By: Ilona Sorrel M.D.   On: 05/30/2016 16:08    Disposition: 03-Skilled Nursing Facility  Discharge Instructions    Diet - low sodium heart healthy    Complete by:  As directed    Increase activity slowly    Complete by:  As directed       Follow-up Information    Feliberto Gottron, PA-C On 06/13/2016.   Specialties:  Orthopedic Surgery, Emergency Medicine Why:  at 2:15pm Contact information: [place] [address] [phone]        Dereck Leep, MD On 07/12/2016.   Specialty:  Orthopedic Surgery Why:  at 10:45am Contact information: [place] [address] [phone]            Signed: Watt Climes 06/01/2016, 7:34 AM

## 2016-06-01 NOTE — Progress Notes (Signed)
   Subjective: 2 Days Post-Op Procedure(s) (LRB): COMPUTER ASSISTED TOTAL KNEE ARTHROPLASTY (Left) Patient reports pain as mild.   Patient is well, and has had no acute complaints or problems Continue with physical therapy today.  Plan is to go Home after hospital stay. no nausea and no vomiting Patient denies any chest pains or shortness of breath. Objective: Vital signs in last 24 hours: Temp:  [97.5 F (36.4 C)-98.2 F (36.8 C)] 98.2 F (36.8 C) (03/14 0414) Pulse Rate:  [72-80] 80 (03/14 0414) Resp:  [16-19] 19 (03/14 0414) BP: (99-126)/(42-53) 126/53 (03/14 0414) SpO2:  [96 %-100 %] 96 % (03/14 0414) well approximated incision Heels are non tender and elevated off the bed using rolled towels Intake/Output from previous day: 03/13 0701 - 03/14 0700 In: 3350 [P.O.:720; I.V.:2180; IV Piggyback:450] Out: 136 [Urine:1; Drains:135] Intake/Output this shift: No intake/output data recorded.   Recent Labs  05/31/16 0333 06/01/16 0449  HGB 9.2* 9.1*    Recent Labs  05/31/16 0333 06/01/16 0449  WBC 9.4 8.5  RBC 3.15* 3.16*  HCT 26.7* 26.3*  PLT 247 221    Recent Labs  05/31/16 0333 06/01/16 0449  NA 132* 134*  K 4.2 3.9  CL 102 102  CO2 26 27  BUN 12 12  CREATININE 0.61 0.82  GLUCOSE 114* 126*  CALCIUM 8.8* 9.1   No results for input(s): LABPT, INR in the last 72 hours.  EXAM General - Patient is Alert, Appropriate and Oriented Extremity - Neurologically intact Neurovascular intact Sensation intact distally Intact pulses distally Dorsiflexion/Plantar flexion intact No cellulitis present Compartment soft Dressing - dressing C/D/I Motor Function - intact, moving foot and toes well on exam.    Past Medical History:  Diagnosis Date  . Anxiety   . Arthritis   . Breast cancer ([place])    left breast cancer  . Cancer ([facility])   . Environmental and seasonal allergies   . GERD (gastroesophageal reflux disease)   . History of left breast cancer   .  Hyperlipidemia   . Hypertension   . Left bundle branch block (LBBB)   . Neuropathy ([facility])     Assessment/Plan: 2 Days Post-Op Procedure(s) (LRB): COMPUTER ASSISTED TOTAL KNEE ARTHROPLASTY (Left) Active Problems:   S/P total knee arthroplasty  Estimated body mass index is 24.8 kg/m as calculated from the following:   Height as of this encounter: 5' 6.5" (1.689 m).   Weight as of this encounter: 70.8 kg (156 lb). Up with therapy Discharge home with home health  Labs: Were reviewed and acceptable DVT Prophylaxis - Lovenox, Foot Pumps and TED hose Weight-Bearing as tolerated to left leg Patient needs to have a bowel movement. Please change dressing prior to patient being discharged. Please give the patient 2 extra honeycomb dressings to take home. Bone foam is to go with patient. Be sure the TED stockings are on both legs prior to being discharged.  Please wash operative leg prior to applying TED stockings  Gasper Hopes R. [place] 06/01/2016, 7:27 AM

## 2016-06-01 NOTE — Progress Notes (Signed)
Patient is being discharged to home with home health. Lovenox education was done with patient and husband using teach back method this morning.  Husband demonstrated injection correctly and articulated correct procedure. DC and Rx instructions given and patient acknowledged understanding. IV removed; belongings packed; husband here to take patient home.

## 2016-06-01 NOTE — Discharge Instructions (Signed)

## 2016-06-01 NOTE — Progress Notes (Signed)
Occupational Therapy Treatment Patient Details Name: Carly Peck MRN: [id number] DOB: 1931-04-24 Today's Date: 06/01/2016    History of present illness Pt is a 83 y/o F s/p L TKA.  Pt's PMH includes breast cancer, LBBB, neuropathy.     OT comments  Pt seen for OT treatment this date. Upon entry, pt reports pain 8/10 in L knee, also concerned about spouse administering Lovenox injections at home. OT notified RN of pt concerns and request for education/training at end of session. Pt educated in ECS to support safety/functional independence with ADL with no questions from pt. Pt able to perform bed mobility with additional time to complete but no physical assist required despite pain, verbal cues provided for hand placement. Pt performed toileting tasks with supervision-min guard from elevated commode with no LOB noted and no increase in pain reported. Pt left seated in recliner with all needs in reach, awaiting breakfast tray to arrive. Pt progressing nicely towards OT goals.   Follow Up Recommendations  Home health OT    Equipment Recommendations  3 in 1 bedside commode    Recommendations for Other Services      Precautions / Restrictions Precautions Precautions: Fall;Knee Precaution Booklet Issued: No Precaution Comments: no KI needed during OT session this date Required Braces or Orthoses: Knee Immobilizer - Left Knee Immobilizer - Left: Other (comment) (if unable to perform SLR) Restrictions Weight Bearing Restrictions: Yes LLE Weight Bearing: Weight bearing as tolerated       Mobility Bed Mobility Overal bed mobility: Needs Assistance Bed Mobility: Sit to Supine     Supine to sit: Supervision;HOB elevated     General bed mobility comments: Pt requires increased time and effort but does not require physical assist or cues  Transfers Overall transfer level: Needs assistance Equipment used: Rolling walker (2 wheeled) Transfers: Sit to/from Stand Sit to Stand: Min  guard              Balance Overall balance assessment: Needs assistance Sitting-balance support: No upper extremity supported;Feet supported Sitting balance-Leahy Scale: Good     Standing balance support: Bilateral upper extremity supported;During functional activity Standing balance-Leahy Scale: Fair                     ADL Overall ADL's : Needs assistance/impaired Eating/Feeding: Sitting;Set up   Grooming: Set up;Sitting                   Toilet Transfer: RW;Comfort height toilet;Min guard;Cueing for safety Toilet Transfer Details (indicate cue type and reason): Pt performed toilet transfer (BSC over the commode) ambulating to bathroom with RW and requiring verbal cues for hand placement to maximize safety Toileting- Clothing Manipulation and Hygiene: Sit to/from stand;Supervision/safety Toileting - Clothing Manipulation Details (indicate cue type and reason): close supervision during sit to stand transfer to perform clothing mgt and hygiene tasks with no LOB noted     Functional mobility during ADLs: Min guard;Rolling walker        [place] tested?: Within functional limits    Cognition   Behavior During Therapy: [facility] for tasks assessed/performed Overall Cognitive Status: Within Functional Limits for tasks assessed                  General Comments: pt and spouse reported concern/anxious about returning home today and spouse being  able to safely provide pt with Lovenox shots; spouse reports having watched them given at the hospital but has not been educated/trained - RN notified for education prior to discharge      Exercises Other Exercises Other Exercises: pt and spouse educated in use of ECS to support functional independence and safety, provided with handout to review   Shoulder Instructions       General Comments      Pertinent Vitals/ Pain       Pain Assessment:  0-10 Pain Score: 8  Pain Location: L knee Pain Descriptors / Indicators: Aching Pain Intervention(s): Limited activity within patient's tolerance;Monitored during session;Repositioned;Premedicated before session;Other (comment) (pt reports pain meds early in am, polar care reapplied)  Home Living                                          Prior Functioning/Environment              Frequency  Min 1X/week        Progress Toward Goals  OT Goals(current goals can now be found in the care plan section)  Progress towards OT goals: Progressing toward goals  Acute Rehab OT Goals Patient Stated Goal: to return home OT Goal Formulation: With patient Time For Goal Achievement: 06/14/16 Potential to Achieve Goals: Good  Plan Discharge plan remains appropriate;Frequency remains appropriate    Co-evaluation                 End of Session Equipment Utilized During Treatment: Gait belt;Rolling walker  OT Visit Diagnosis: Other abnormalities of gait and mobility (R26.89);Muscle weakness (generalized) (M62.81);Pain Pain - Right/Left: Left Pain - part of body: Knee   Activity Tolerance Patient tolerated treatment well   Patient Left in chair;with call bell/phone within reach;with chair alarm set;with family/visitor present;Other (comment) (polar care in place, rolled towel to float L ankle)   Nurse Communication Other (comment) (RN notified of pt/spouse's request for education/training in administering Lovenox injections at home; RN asst notified of pt's request for change of bed linens after OT removed soiled linens from bed)        Time: 7026-3785 OT Time Calculation (min): 25 min  Charges: OT General Charges $OT Visit: 1 Procedure OT Treatments $Self Care/Home Management : 23-37 mins  Jeni Salles, MPH, MS, OTR/L ascom [phone] 06/01/16, 9:18 AM

## 2016-07-14 ENCOUNTER — Other Ambulatory Visit: Payer: Self-pay | Admitting: Internal Medicine

## 2016-07-14 DIAGNOSIS — Z1231 Encounter for screening mammogram for malignant neoplasm of breast: Secondary | ICD-10-CM

## 2016-08-02 ENCOUNTER — Other Ambulatory Visit: Payer: Self-pay | Admitting: Internal Medicine

## 2016-08-02 ENCOUNTER — Ambulatory Visit
Admission: RE | Admit: 2016-08-02 | Discharge: 2016-08-02 | Disposition: A | Discharge status: Home / Self Care | Payer: 59 | Source: Ambulatory Visit | Attending: Internal Medicine | Admitting: Internal Medicine

## 2016-08-02 DIAGNOSIS — Z1231 Encounter for screening mammogram for malignant neoplasm of breast: Secondary | ICD-10-CM | POA: Diagnosis not present

## 2016-09-05 ENCOUNTER — Ambulatory Visit
Admission: RE | Admit: 2016-09-05 | Discharge: 2016-09-05 | Disposition: A | Discharge status: Home / Self Care | Payer: Medicare Other | Source: Ambulatory Visit | Attending: Internal Medicine | Admitting: Internal Medicine

## 2016-09-05 ENCOUNTER — Other Ambulatory Visit: Payer: Self-pay | Admitting: Internal Medicine

## 2016-09-05 DIAGNOSIS — R600 Localized edema: Secondary | ICD-10-CM | POA: Insufficient documentation

## 2016-09-15 DIAGNOSIS — C44320 Squamous cell carcinoma of skin of unspecified parts of face: Secondary | ICD-10-CM | POA: Insufficient documentation

## 2016-09-15 DIAGNOSIS — Z85828 Personal history of other malignant neoplasm of skin: Secondary | ICD-10-CM | POA: Insufficient documentation

## 2016-09-24 IMAGING — MG MM DIGITAL SCREENING UNILAT*R* W/ TOMO W/ CAD
7 series · 9 of 15 positions shown · non-contrast
Comparison: Previous exam(s).

CLINICAL DATA: Screening. Prior malignant left mastectomy.

EXAM:
2D DIGITAL SCREENING UNILATERAL RIGHT MAMMOGRAM WITH CAD AND ADJUNCT
TOMO

[R MLO (1 of 2)]
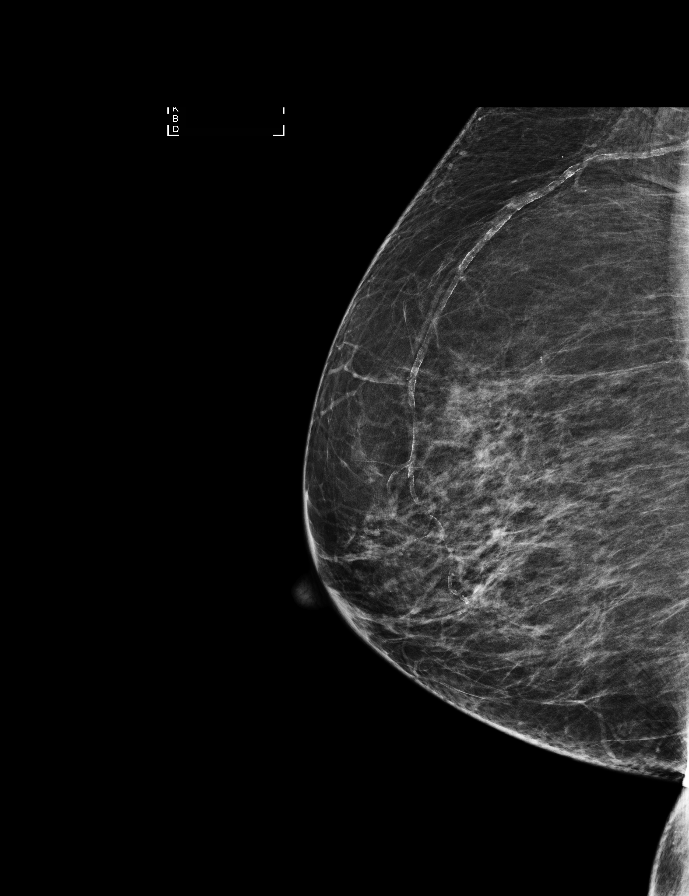

[R CC synth-2D]
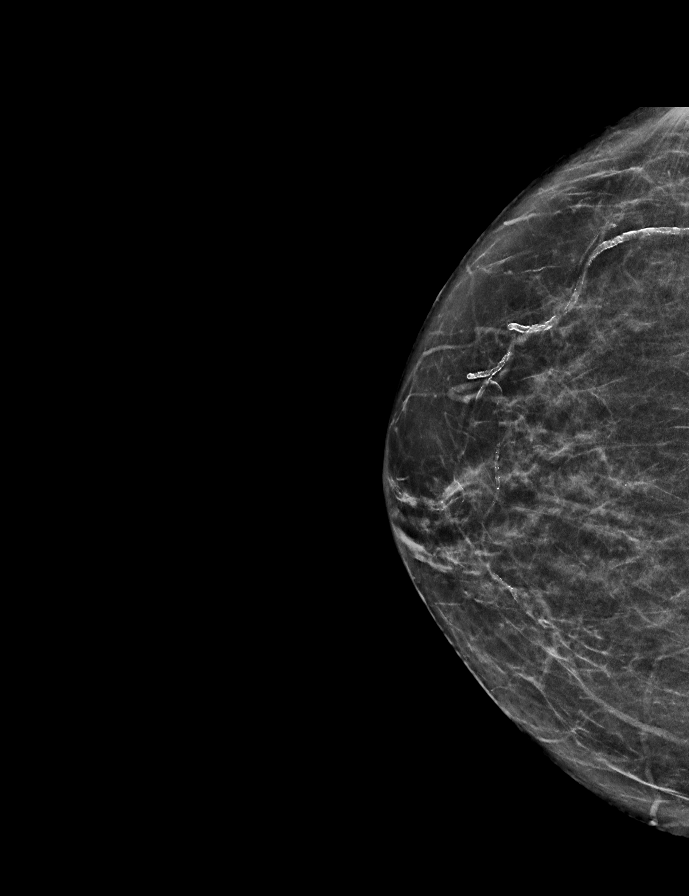

[R MLO (2 of 2)]
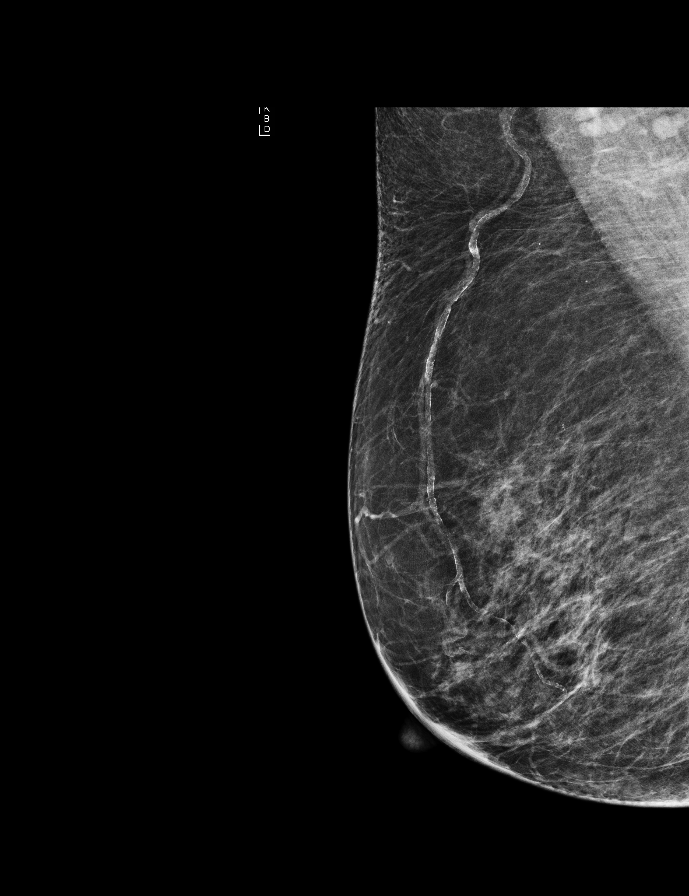

[R CC]
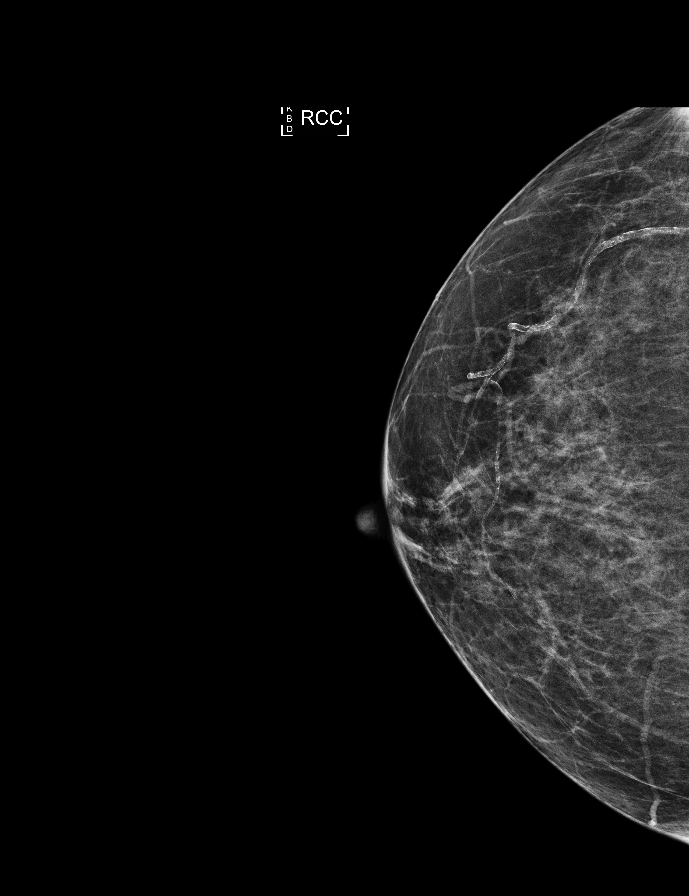

[R MLO synth-2D]
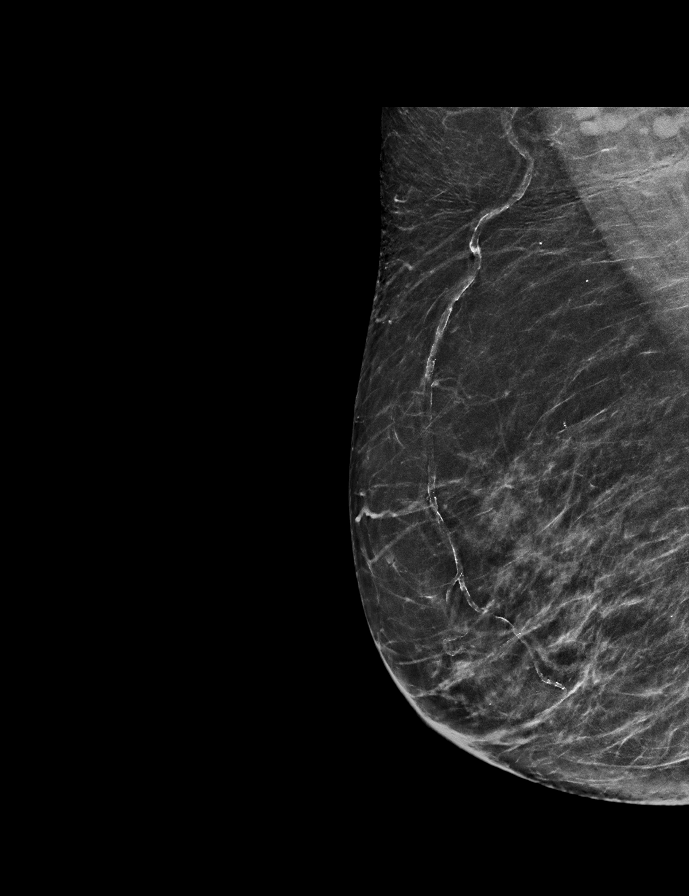

[R MLO tomo · 3 of 65 frames shown]
[frame 21/65]
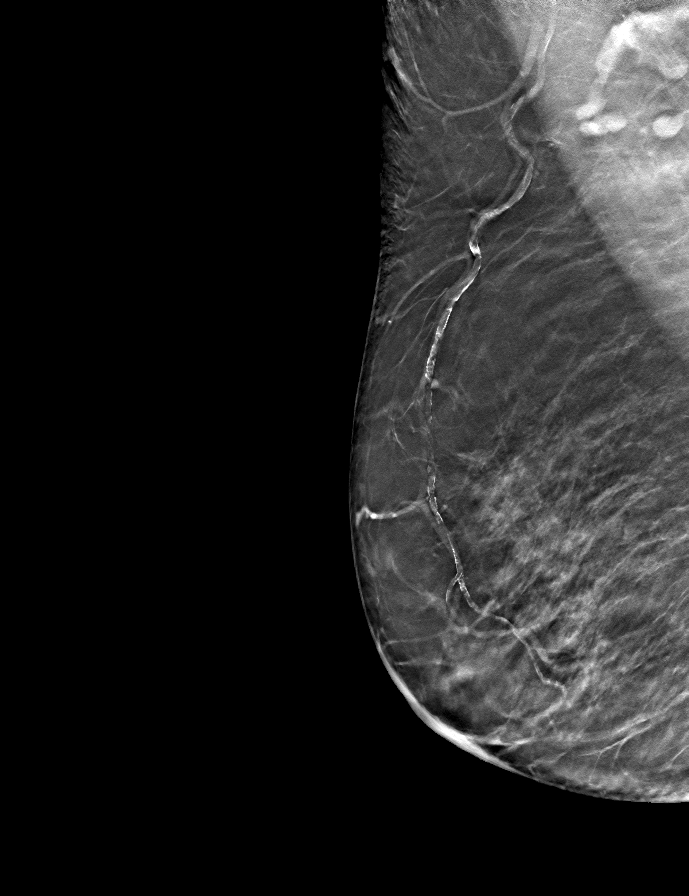
[frame 33/65]
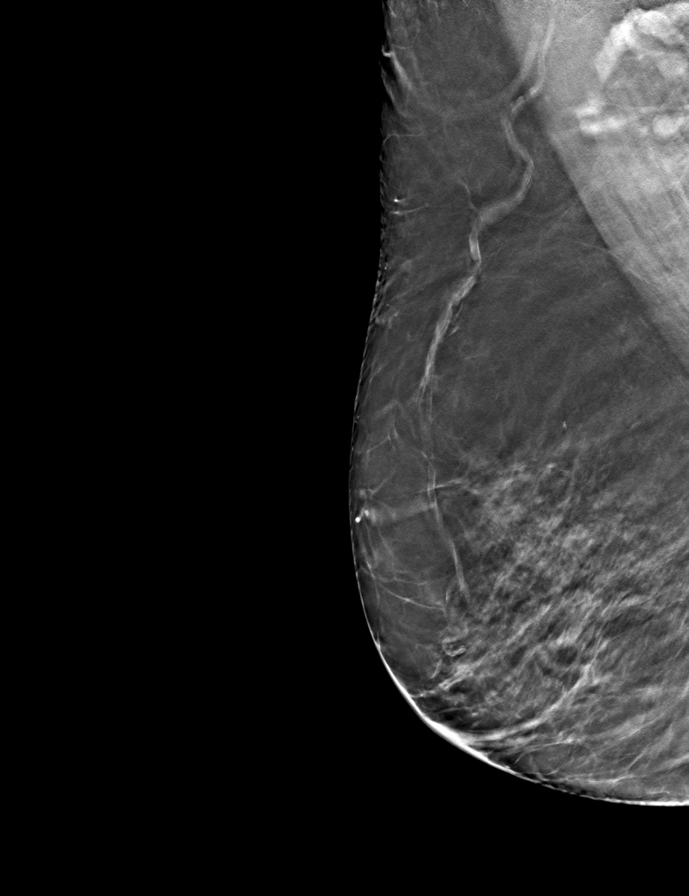
[frame 45/65]
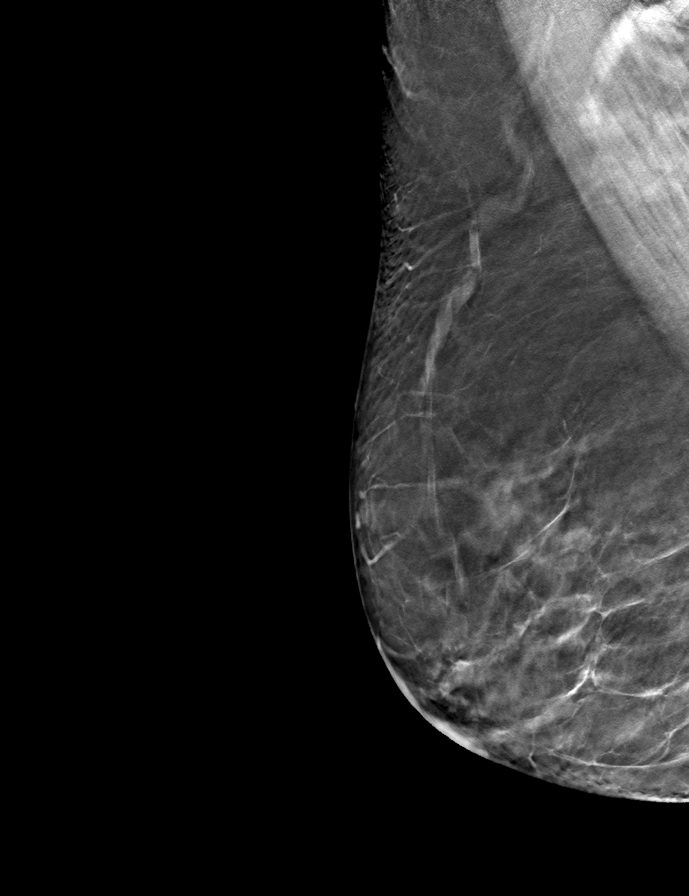

[R CC tomo · tomo slice 31/60.0]
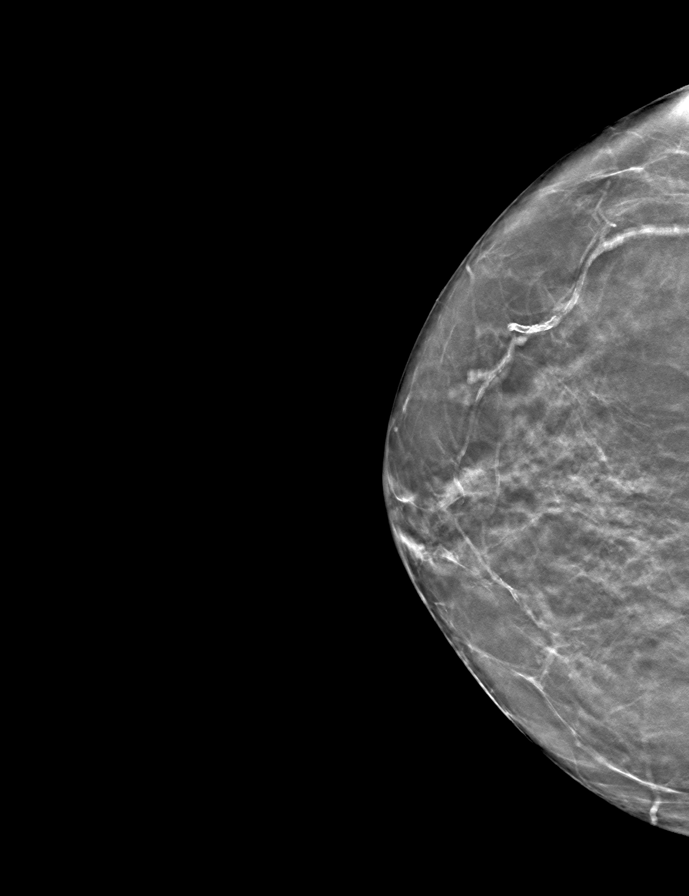

[9 of 15 positions shown; findings below may reference images not displayed]

ACR Breast Density Category b: There are scattered areas of
fibroglandular density.
FINDINGS: The patient has had a left mastectomy. There are no findings
suspicious for malignancy in the right breast. Images were processed
with CAD.
IMPRESSION: No mammographic evidence of malignancy. A result letter of this
screening mammogram will be mailed directly to the patient.

RECOMMENDATION:
Screening mammogram in one year.  (Code:P7-S-987)

BI-RADS CATEGORY  1: Negative.

## 2016-10-20 ENCOUNTER — Other Ambulatory Visit: Payer: Self-pay | Admitting: Podiatry

## 2016-10-20 ENCOUNTER — Ambulatory Visit
Admission: RE | Admit: 2016-10-20 | Discharge: 2016-10-20 | Disposition: A | Discharge status: Home / Self Care | Payer: Medicare Other | Source: Ambulatory Visit | Attending: Podiatry | Admitting: Podiatry

## 2016-10-20 DIAGNOSIS — M79605 Pain in left leg: Secondary | ICD-10-CM

## 2016-12-07 ENCOUNTER — Inpatient Hospital Stay: Payer: Medicare Other | Attending: Internal Medicine

## 2016-12-07 ENCOUNTER — Inpatient Hospital Stay: Payer: Medicare Other | Admitting: Internal Medicine

## 2016-12-07 ENCOUNTER — Inpatient Hospital Stay: Payer: Medicare Other

## 2016-12-07 DIAGNOSIS — Z853 Personal history of malignant neoplasm of breast: Secondary | ICD-10-CM | POA: Insufficient documentation

## 2016-12-07 DIAGNOSIS — M79605 Pain in left leg: Secondary | ICD-10-CM | POA: Diagnosis not present

## 2016-12-07 DIAGNOSIS — R700 Elevated erythrocyte sedimentation rate: Secondary | ICD-10-CM | POA: Diagnosis not present

## 2016-12-07 DIAGNOSIS — F419 Anxiety disorder, unspecified: Secondary | ICD-10-CM | POA: Diagnosis not present

## 2016-12-07 DIAGNOSIS — Z9012 Acquired absence of left breast and nipple: Secondary | ICD-10-CM | POA: Insufficient documentation

## 2016-12-07 DIAGNOSIS — G629 Polyneuropathy, unspecified: Secondary | ICD-10-CM | POA: Insufficient documentation

## 2016-12-07 DIAGNOSIS — D472 Monoclonal gammopathy: Secondary | ICD-10-CM | POA: Diagnosis present

## 2016-12-07 DIAGNOSIS — Z9221 Personal history of antineoplastic chemotherapy: Secondary | ICD-10-CM | POA: Insufficient documentation

## 2016-12-07 DIAGNOSIS — K219 Gastro-esophageal reflux disease without esophagitis: Secondary | ICD-10-CM | POA: Diagnosis not present

## 2016-12-07 DIAGNOSIS — M129 Arthropathy, unspecified: Secondary | ICD-10-CM | POA: Diagnosis not present

## 2016-12-07 DIAGNOSIS — Z79899 Other long term (current) drug therapy: Secondary | ICD-10-CM | POA: Diagnosis not present

## 2016-12-07 DIAGNOSIS — E785 Hyperlipidemia, unspecified: Secondary | ICD-10-CM | POA: Diagnosis not present

## 2016-12-07 DIAGNOSIS — I447 Left bundle-branch block, unspecified: Secondary | ICD-10-CM | POA: Insufficient documentation

## 2016-12-07 DIAGNOSIS — I10 Essential (primary) hypertension: Secondary | ICD-10-CM | POA: Diagnosis not present

## 2016-12-07 LAB — CBC WITH DIFFERENTIAL/PLATELET
Basophils Absolute: 0.1 10*3/uL (ref 0–0.1)
Basophils Relative: 1 %
EOS PCT: 3 %
Eosinophils Absolute: 0.2 10*3/uL (ref 0–0.7)
HCT: 31 % — ABNORMAL LOW (ref 35.0–47.0)
Hemoglobin: 10.5 g/dL — ABNORMAL LOW (ref 12.0–16.0)
LYMPHS ABS: 1.4 10*3/uL (ref 1.0–3.6)
Lymphocytes Relative: 19 %
MCH: 27.8 pg (ref 26.0–34.0)
MCHC: 34 g/dL (ref 32.0–36.0)
MCV: 81.8 fL (ref 80.0–100.0)
MONO ABS: 0.8 10*3/uL (ref 0.2–0.9)
Monocytes Relative: 10 %
NEUTROS ABS: 4.9 10*3/uL (ref 1.4–6.5)
NEUTROS PCT: 67 %
PLATELETS: 335 10*3/uL (ref 150–440)
RBC: 3.79 MIL/uL — ABNORMAL LOW (ref 3.80–5.20)
RDW: 15.2 % — ABNORMAL HIGH (ref 11.5–14.5)
WBC: 7.3 10*3/uL (ref 3.6–11.0)

## 2016-12-07 LAB — COMPREHENSIVE METABOLIC PANEL
ALT: 11 U/L — AB (ref 14–54)
AST: 19 U/L (ref 15–41)
Albumin: 3.8 g/dL (ref 3.5–5.0)
Alkaline Phosphatase: 83 U/L (ref 38–126)
Anion gap: 10 (ref 5–15)
BUN: 23 mg/dL — AB (ref 6–20)
CHLORIDE: 100 mmol/L — AB (ref 101–111)
CO2: 24 mmol/L (ref 22–32)
Calcium: 10 mg/dL (ref 8.9–10.3)
Creatinine, Ser: 1.04 mg/dL — ABNORMAL HIGH (ref 0.44–1.00)
GFR, EST AFRICAN AMERICAN: 56 mL/min — AB (ref >60)
GFR, EST NON AFRICAN AMERICAN: 48 mL/min — AB (ref >60)
Glucose, Bld: 137 mg/dL — ABNORMAL HIGH (ref 65–99)
Potassium: 4.1 mmol/L (ref 3.5–5.1)
Sodium: 134 mmol/L — ABNORMAL LOW (ref 135–145)
Total Bilirubin: 0.6 mg/dL (ref 0.3–1.2)
Total Protein: 7.7 g/dL (ref 6.5–8.1)

## 2016-12-07 LAB — SEDIMENTATION RATE: Sed Rate: 91 mm/hr — ABNORMAL HIGH (ref 0–30)

## 2016-12-07 LAB — C-REACTIVE PROTEIN: CRP: <0.8 mg/dL (ref <1.0)

## 2016-12-08 LAB — KAPPA/LAMBDA LIGHT CHAINS
Kappa free light chain: 6.8 mg/L (ref 3.3–19.4)
Kappa, lambda light chain ratio: 0.19 — ABNORMAL LOW (ref 0.26–1.65)
Lambda free light chains: 35.4 mg/L — ABNORMAL HIGH (ref 5.7–26.3)

## 2016-12-13 LAB — MULTIPLE MYELOMA PANEL, SERUM
Albumin SerPl Elph-Mcnc: 3.4 g/dL (ref 2.9–4.4)
Albumin/Glob SerPl: 0.9 (ref 0.7–1.7)
Alpha 1: 0.3 g/dL (ref 0.0–0.4)
Alpha2 Glob SerPl Elph-Mcnc: 0.8 g/dL (ref 0.4–1.0)
B-GLOBULIN SERPL ELPH-MCNC: 1.3 g/dL (ref 0.7–1.3)
GAMMA GLOB SERPL ELPH-MCNC: 1.8 g/dL (ref 0.4–1.8)
Globulin, Total: 4.1 g/dL — ABNORMAL HIGH (ref 2.2–3.9)
IGA: 31 mg/dL — AB (ref 64–422)
IgG (Immunoglobin G), Serum: 347 mg/dL — ABNORMAL LOW (ref 700–1600)
IgM (Immunoglobulin M), Srm: 2371 mg/dL — ABNORMAL HIGH (ref 26–217)
M PROTEIN SERPL ELPH-MCNC: 1.3 g/dL — AB
TOTAL PROTEIN ELP: 7.5 g/dL (ref 6.0–8.5)

## 2016-12-15 ENCOUNTER — Inpatient Hospital Stay (HOSPITAL_BASED_OUTPATIENT_CLINIC_OR_DEPARTMENT_OTHER): Payer: Medicare Other | Admitting: Internal Medicine

## 2016-12-15 VITALS — BP 156/88 | HR 80 | Temp 97.8°F | Resp 20 | Ht 66.5 in | Wt 157.0 lb

## 2016-12-15 DIAGNOSIS — K219 Gastro-esophageal reflux disease without esophagitis: Secondary | ICD-10-CM | POA: Diagnosis not present

## 2016-12-15 DIAGNOSIS — Z853 Personal history of malignant neoplasm of breast: Secondary | ICD-10-CM

## 2016-12-15 DIAGNOSIS — Z9012 Acquired absence of left breast and nipple: Secondary | ICD-10-CM | POA: Diagnosis not present

## 2016-12-15 DIAGNOSIS — R700 Elevated erythrocyte sedimentation rate: Secondary | ICD-10-CM | POA: Diagnosis not present

## 2016-12-15 DIAGNOSIS — M79605 Pain in left leg: Secondary | ICD-10-CM | POA: Diagnosis not present

## 2016-12-15 DIAGNOSIS — F419 Anxiety disorder, unspecified: Secondary | ICD-10-CM

## 2016-12-15 DIAGNOSIS — Z9221 Personal history of antineoplastic chemotherapy: Secondary | ICD-10-CM

## 2016-12-15 DIAGNOSIS — M129 Arthropathy, unspecified: Secondary | ICD-10-CM | POA: Diagnosis not present

## 2016-12-15 DIAGNOSIS — I447 Left bundle-branch block, unspecified: Secondary | ICD-10-CM

## 2016-12-15 DIAGNOSIS — I10 Essential (primary) hypertension: Secondary | ICD-10-CM | POA: Diagnosis not present

## 2016-12-15 DIAGNOSIS — E785 Hyperlipidemia, unspecified: Secondary | ICD-10-CM | POA: Diagnosis not present

## 2016-12-15 DIAGNOSIS — D472 Monoclonal gammopathy: Secondary | ICD-10-CM | POA: Insufficient documentation

## 2016-12-15 DIAGNOSIS — C880 Waldenstrom macroglobulinemia: Secondary | ICD-10-CM

## 2016-12-15 DIAGNOSIS — G629 Polyneuropathy, unspecified: Secondary | ICD-10-CM

## 2016-12-15 DIAGNOSIS — Z79899 Other long term (current) drug therapy: Secondary | ICD-10-CM

## 2016-12-15 NOTE — Assessment & Plan Note (Signed)
#  MGUS-IgM lambda- most recent M protein  SEP 2018-1.3 g/dL [June 2016-0.9 g].  CBC within normal limits except for mildly low hemoglobin of 10.5.  Monitor for now.   # Patient has high-risk MGUS [progression to [facility] versus lymphoma; fairly multiple myeloma]- and the suspected patient's neuropathy is related to her MGUS. Will discuss with neurology regarding etiology of neuropathy; treatment options.  # I Would recommend PET/BMBx -if M- protein continues to rise; after discussion with neurology.  #  Chronic neuropathy-nortryptline/ gabapentin- STABLE/ but not worse. [Dr.Potter]; see above.  # Elevated ESR- likely artifactual given- M protein in the blood. CRP- Normal.   # History of breast cancer- ER/PR positive high risk/stage III. Clinically no evidence of recurrence.  # follow in 6 months with labs few days prior.

## 2016-12-15 NOTE — Progress Notes (Signed)
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 degen changes; SEP 2018-M spike- 1.3 gm/dl  # 2016-Temporal A Bx-neg [headaches/diziness].   # Bil LE PN [Dr.Potter- on Neurontin]  # 1997-LEFT BREAST CA Stage III  ER-POS;s/p MASTEC;s/p chemo; s/p RT NSABP- 33; s/p Aromasin.   # ESR- Elevated ? 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  Patient complains of left lower extremity pain; intermittent swelling. Follows up with neurology; Dr. Melrose Nakayama. Diagnosed with peripheral neuropathy- currently on Neurontin. Stable.  Otherwise not losing any weight. No nausea vomiting. No other aches and pains.  REVIEW OF SYSTEMS:  A complete 10 point review of system is done which is negative except mentioned above/history of present illness.   PAST MEDICAL HISTORY :  Past Medical History:  Diagnosis Date  . Anxiety   . Arthritis   . Breast cancer ([place])    left breast cancer  . Cancer ([place])   . Environmental and seasonal allergies   . GERD (gastroesophageal reflux disease)   . History of left breast cancer   . Hyperlipidemia   . Hypertension   . Left bundle branch block (LBBB)   . Neuropathy     PAST SURGICAL HISTORY :   Past Surgical History:  Procedure Laterality Date  . BACK SURGERY    . BREAST SURGERY    . CARDIAC CATHETERIZATION    . DILATION AND CURETTAGE OF UTERUS    . EYE SURGERY Bilateral    Cataract Extraction with IOL  . KNEE ARTHROPLASTY Right 09/14/2015   Procedure:  COMPUTER ASSISTED TOTAL KNEE ARTHROPLASTY;  Surgeon: Dereck Leep, MD;  Location: [facility] ORS;  Service: Orthopedics;  Laterality: Right;  . KNEE ARTHROPLASTY Left 05/30/2016   Procedure: COMPUTER ASSISTED TOTAL KNEE ARTHROPLASTY;  Surgeon: Dereck Leep, MD;  Location: [facility] ORS;  Service: Orthopedics;  Laterality: Left;  . KYPHOPLASTY  2016   Dr. Rudene Christians, [facility]  . MASTECTOMY Left 1997   chemo rad mastectomy  . TONSILLECTOMY      FAMILY HISTORY :  No family history on file.  SOCIAL HISTORY:   Social History  Substance Use Topics  . Smoking status: Never Smoker  . Smokeless tobacco: Never Used  . Alcohol use 0.6 oz/week    1 Glasses of wine per week     Comment: occ.    ALLERGIES:  is allergic to adhesive [tape] and ultram [tramadol].  MEDICATIONS:  Current Outpatient Prescriptions  Medication Sig Dispense Refill  . acetaminophen (TYLENOL) 500 MG tablet Take 1,000 mg by mouth 2 (two) times daily as needed for mild pain or headache.     . carvedilol (COREG) 3.125 MG tablet Take 3.125 mg by mouth 2 (two) times daily.    . celecoxib (CELEBREX) 200 MG capsule Take 200 mg by mouth daily.    . cetirizine (ZYRTEC) 10 MG tablet Take 10 mg by mouth daily.    . Cholecalciferol (VITAMIN D3) 2000 units TABS Take 2,000 Units by mouth daily.    . clobetasol ointment (  TEMOVATE) 0.05 % APPLY TO AFFECTED AREA DAILY AS NEEDED for rash on buttocks  3  . fluticasone (FLONASE) 50 MCG/ACT nasal spray Place 2 sprays into both nostrils 2 (two) times daily as needed for rhinitis.    Marland Kitchen gabapentin (NEURONTIN) 100 MG capsule Take 200 mg by mouth 2 (two) times daily.    Marland Kitchen losartan (COZAAR) 100 MG tablet Take 100 mg by mouth daily.   11  . omeprazole (PRILOSEC) 20 MG capsule Take 20 mg by mouth daily.    . sennosides-docusate sodium (SENOKOT-S) 8.6-50 MG tablet Take 4 tablets by mouth at bedtime.     . simvastatin (ZOCOR) 40 MG tablet Take 40 mg by mouth at bedtime.    . torsemide (DEMADEX) 10 MG tablet Take 10  mg by mouth as needed (edema).      No current facility-administered medications for this visit.     PHYSICAL EXAMINATION:  BP (!) 156/88   Pulse 80   Temp 97.8 F (36.6 C) (Tympanic)   Resp 20   Ht 5' 6.5" (1.689 m)   Wt 157 lb (71.2 kg)   BMI 24.96 kg/m   Filed Weights   12/15/16 0905  Weight: 157 lb (71.2 kg)    GENERAL: Well-nourished well-developed; Alert, no distress and comfortable.  She is alone.  EYES: no pallor or icterus OROPHARYNX: no thrush or ulceration; good dentition  NECK: supple, no masses felt LYMPH:  no palpable lymphadenopathy in the cervical, axillary or inguinal regions LUNGS: clear to auscultation and  No wheeze or crackles HEART/CVS: regular rate & rhythm and no murmurs; No lower extremity edema ABDOMEN:abdomen soft, non-tender and normal bowel sounds Musculoskeletal:no cyanosis of digits and no clubbing  PSYCH: alert & oriented x 3 with fluent speech NEURO: no focal motor/sensory deficits SKIN:  no rashes or significant lesions  LABORATORY DATA:  I have reviewed the data as listed    Component Value Date/Time   NA 134 (L) 12/07/2016 1251   NA 132 (L) 02/02/2014 1947   K 4.1 12/07/2016 1251   K 4.3 02/02/2014 1947   CL 100 (L) 12/07/2016 1251   CL 100 02/02/2014 1947   CO2 24 12/07/2016 1251   CO2 21 02/02/2014 1947   GLUCOSE 137 (H) 12/07/2016 1251   GLUCOSE 107 (H) 02/02/2014 1947   BUN 23 (H) 12/07/2016 1251   BUN 22 (H) 02/02/2014 1947   CREATININE 1.04 (H) 12/07/2016 1251   CREATININE 0.94 02/02/2014 1947   CALCIUM 10.0 12/07/2016 1251   CALCIUM 10.2 12/08/2014 1540   PROT 7.7 12/07/2016 1251   PROT 7.7 10/21/2013 1106   ALBUMIN 3.8 12/07/2016 1251   ALBUMIN 3.3 (L) 10/21/2013 1106   AST 19 12/07/2016 1251   AST 16 10/21/2013 1106   ALT 11 (L) 12/07/2016 1251   ALT 25 10/21/2013 1106   ALKPHOS 83 12/07/2016 1251   ALKPHOS 83 10/21/2013 1106   BILITOT 0.6 12/07/2016 1251   BILITOT 0.5 10/21/2013 1106   GFRNONAA 48 (L)  12/07/2016 1251   GFRNONAA >60 02/02/2014 1947   GFRNONAA 57 (L) 10/21/2013 1106   GFRAA 56 (L) 12/07/2016 1251   GFRAA >60 02/02/2014 1947   GFRAA >60 10/21/2013 1106    No results found for: SPEP, UPEP  Lab Results  Component Value Date   WBC 7.3 12/07/2016   NEUTROABS 4.9 12/07/2016   HGB 10.5 (L) 12/07/2016   HCT 31.0 (L) 12/07/2016   MCV 81.8 12/07/2016   PLT 335 12/07/2016  Chemistry      Component Value Date/Time   NA 134 (L) 12/07/2016 1251   NA 132 (L) 02/02/2014 1947   K 4.1 12/07/2016 1251   K 4.3 02/02/2014 1947   CL 100 (L) 12/07/2016 1251   CL 100 02/02/2014 1947   CO2 24 12/07/2016 1251   CO2 21 02/02/2014 1947   BUN 23 (H) 12/07/2016 1251   BUN 22 (H) 02/02/2014 1947   CREATININE 1.04 (H) 12/07/2016 1251   CREATININE 0.94 02/02/2014 1947      Component Value Date/Time   CALCIUM 10.0 12/07/2016 1251   CALCIUM 10.2 12/08/2014 1540   ALKPHOS 83 12/07/2016 1251   ALKPHOS 83 10/21/2013 1106   AST 19 12/07/2016 1251   AST 16 10/21/2013 1106   ALT 11 (L) 12/07/2016 1251   ALT 25 10/21/2013 1106   BILITOT 0.6 12/07/2016 1251   BILITOT 0.5 10/21/2013 1106     Results for Fess, Magdalen T (MRN [id number]) as of 12/15/2016 09:03  Ref. Range 01/14/2015 14:21 05/05/2015 09:54 05/05/2015 09:55 12/02/2015 10:46 12/07/2016 12:51  M Protein SerPl Elph-Mcnc Latest Ref Range: Not Observed g/dL   0.8 (H) 0.9 (H) 1.3 (H)    RADIOGRAPHIC STUDIES: I have personally reviewed the radiological images as listed and agreed with the findings in the report. No results found.   ASSESSMENT & PLAN:  MGUS (monoclonal gammopathy of unknown significance) # MGUS-IgM lambda- most recent M protein  SEP 2018-1.3 g/dL [June 2016-0.9 g].  CBC within normal limits except for mildly low hemoglobin of 10.5.  Monitor for now.   # Patient has high-risk MGUS [progression to [facility] versus lymphoma; fairly multiple myeloma]- and the suspected patient's neuropathy is related to her  MGUS. Will discuss with neurology regarding etiology of neuropathy; treatment options.  # I Would recommend PET/BMBx -if M- protein continues to rise; after discussion with neurology.  #  Chronic neuropathy-nortryptline/ gabapentin- STABLE/ but not worse. [Dr.Potter]; see above.  # Elevated ESR- likely artifactual given- M protein in the blood. CRP- Normal.   # History of breast cancer- ER/PR positive high risk/stage III. Clinically no evidence of recurrence.  # follow in 6 months with labs few days prior.      Cammie Sickle, MD 12/15/2016 12:28 PM

## 2016-12-15 NOTE — Assessment & Plan Note (Deleted)
#  MGUS-IgM lambda- most recent M protein  SEP 2018-1.3 g/dL [June 2016-0.9 g].  CBC within normal limits except for mildly low hemoglobin of 10.5.  Monitor for now.  Would recommend PET/BMBx -if M- protein con  #  Chronic neuropathy-nortryptline/ gabapentin- STABLE/ but not worse. [Dr.Potter]  # Elevated ESR- likely artifactual given- M protein in the blood. CRP- Normal.   # History of breast cancer- ER/PR positive high risk/stage III. Clinically no evidence of recurrence.  # follow in 6 months with labs few days prior.

## 2017-06-08 ENCOUNTER — Inpatient Hospital Stay: Payer: Medicare Other | Attending: Internal Medicine

## 2017-06-08 DIAGNOSIS — G629 Polyneuropathy, unspecified: Secondary | ICD-10-CM | POA: Insufficient documentation

## 2017-06-08 DIAGNOSIS — D472 Monoclonal gammopathy: Secondary | ICD-10-CM | POA: Diagnosis not present

## 2017-06-08 DIAGNOSIS — F419 Anxiety disorder, unspecified: Secondary | ICD-10-CM | POA: Diagnosis not present

## 2017-06-08 DIAGNOSIS — Z9012 Acquired absence of left breast and nipple: Secondary | ICD-10-CM | POA: Insufficient documentation

## 2017-06-08 DIAGNOSIS — E785 Hyperlipidemia, unspecified: Secondary | ICD-10-CM | POA: Insufficient documentation

## 2017-06-08 DIAGNOSIS — I447 Left bundle-branch block, unspecified: Secondary | ICD-10-CM | POA: Diagnosis not present

## 2017-06-08 DIAGNOSIS — Z79899 Other long term (current) drug therapy: Secondary | ICD-10-CM | POA: Insufficient documentation

## 2017-06-08 DIAGNOSIS — K219 Gastro-esophageal reflux disease without esophagitis: Secondary | ICD-10-CM | POA: Diagnosis not present

## 2017-06-08 DIAGNOSIS — I10 Essential (primary) hypertension: Secondary | ICD-10-CM | POA: Insufficient documentation

## 2017-06-08 DIAGNOSIS — M129 Arthropathy, unspecified: Secondary | ICD-10-CM | POA: Insufficient documentation

## 2017-06-08 DIAGNOSIS — Z853 Personal history of malignant neoplasm of breast: Secondary | ICD-10-CM | POA: Diagnosis not present

## 2017-06-08 DIAGNOSIS — C880 Waldenstrom macroglobulinemia: Secondary | ICD-10-CM

## 2017-06-08 LAB — CBC WITH DIFFERENTIAL/PLATELET
BASOS PCT: 1 %
Basophils Absolute: 0.1 10*3/uL (ref 0–0.1)
EOS ABS: 0.1 10*3/uL (ref 0–0.7)
Eosinophils Relative: 1 %
HEMATOCRIT: 33.4 % — AB (ref 35.0–47.0)
Hemoglobin: 11.5 g/dL — ABNORMAL LOW (ref 12.0–16.0)
Lymphocytes Relative: 14 %
Lymphs Abs: 1.2 10*3/uL (ref 1.0–3.6)
MCH: 28.7 pg (ref 26.0–34.0)
MCHC: 34.4 g/dL (ref 32.0–36.0)
MCV: 83.5 fL (ref 80.0–100.0)
MONOS PCT: 9 %
Monocytes Absolute: 0.8 10*3/uL (ref 0.2–0.9)
Neutro Abs: 6.4 10*3/uL (ref 1.4–6.5)
Neutrophils Relative %: 75 %
Platelets: 394 10*3/uL (ref 150–440)
RBC: 4 MIL/uL (ref 3.80–5.20)
RDW: 15.3 % — ABNORMAL HIGH (ref 11.5–14.5)
WBC: 8.5 10*3/uL (ref 3.6–11.0)

## 2017-06-08 LAB — COMPREHENSIVE METABOLIC PANEL
ALBUMIN: 3.8 g/dL (ref 3.5–5.0)
ALK PHOS: 86 U/L (ref 38–126)
ALT: 9 U/L — AB (ref 14–54)
AST: 14 U/L — AB (ref 15–41)
Anion gap: 9 (ref 5–15)
BILIRUBIN TOTAL: 0.8 mg/dL (ref 0.3–1.2)
BUN: 23 mg/dL — AB (ref 6–20)
CALCIUM: 9.9 mg/dL (ref 8.9–10.3)
CO2: 23 mmol/L (ref 22–32)
CREATININE: 0.73 mg/dL (ref 0.44–1.00)
Chloride: 99 mmol/L — ABNORMAL LOW (ref 101–111)
GFR calc Af Amer: >60 mL/min (ref >60)
GFR calc non Af Amer: >60 mL/min (ref >60)
GLUCOSE: 97 mg/dL (ref 65–99)
Potassium: 4.2 mmol/L (ref 3.5–5.1)
SODIUM: 131 mmol/L — AB (ref 135–145)
TOTAL PROTEIN: 8.1 g/dL (ref 6.5–8.1)

## 2017-06-09 LAB — KAPPA/LAMBDA LIGHT CHAINS
KAPPA, LAMDA LIGHT CHAIN RATIO: 0.25 — AB (ref 0.26–1.65)
Kappa free light chain: 6.8 mg/L (ref 3.3–19.4)
Lambda free light chains: 26.7 mg/L — ABNORMAL HIGH (ref 5.7–26.3)

## 2017-06-13 LAB — MULTIPLE MYELOMA PANEL, SERUM
ALBUMIN SERPL ELPH-MCNC: 3.6 g/dL (ref 2.9–4.4)
ALBUMIN/GLOB SERPL: 0.9 (ref 0.7–1.7)
Alpha 1: 0.3 g/dL (ref 0.0–0.4)
Alpha2 Glob SerPl Elph-Mcnc: 0.8 g/dL (ref 0.4–1.0)
B-Globulin SerPl Elph-Mcnc: 1.4 g/dL — ABNORMAL HIGH (ref 0.7–1.3)
GAMMA GLOB SERPL ELPH-MCNC: 1.8 g/dL (ref 0.4–1.8)
GLOBULIN, TOTAL: 4.3 g/dL — AB (ref 2.2–3.9)
IGA: 30 mg/dL — AB (ref 64–422)
IgG (Immunoglobin G), Serum: 408 mg/dL — ABNORMAL LOW (ref 700–1600)
IgM (Immunoglobulin M), Srm: 2781 mg/dL — ABNORMAL HIGH (ref 26–217)
M Protein SerPl Elph-Mcnc: 1.4 g/dL — ABNORMAL HIGH
Total Protein ELP: 7.9 g/dL (ref 6.0–8.5)

## 2017-06-15 ENCOUNTER — Other Ambulatory Visit: Payer: Self-pay

## 2017-06-15 ENCOUNTER — Inpatient Hospital Stay (HOSPITAL_BASED_OUTPATIENT_CLINIC_OR_DEPARTMENT_OTHER): Payer: Medicare Other | Admitting: Internal Medicine

## 2017-06-15 ENCOUNTER — Encounter: Payer: Self-pay | Admitting: Internal Medicine

## 2017-06-15 VITALS — BP 153/73 | HR 73 | Temp 97.6°F | Resp 20 | Ht 66.5 in | Wt 158.0 lb

## 2017-06-15 DIAGNOSIS — Z853 Personal history of malignant neoplasm of breast: Secondary | ICD-10-CM

## 2017-06-15 DIAGNOSIS — G629 Polyneuropathy, unspecified: Secondary | ICD-10-CM

## 2017-06-15 DIAGNOSIS — Z9012 Acquired absence of left breast and nipple: Secondary | ICD-10-CM

## 2017-06-15 DIAGNOSIS — I447 Left bundle-branch block, unspecified: Secondary | ICD-10-CM

## 2017-06-15 DIAGNOSIS — M129 Arthropathy, unspecified: Secondary | ICD-10-CM

## 2017-06-15 DIAGNOSIS — K219 Gastro-esophageal reflux disease without esophagitis: Secondary | ICD-10-CM

## 2017-06-15 DIAGNOSIS — Z79899 Other long term (current) drug therapy: Secondary | ICD-10-CM | POA: Diagnosis not present

## 2017-06-15 DIAGNOSIS — E785 Hyperlipidemia, unspecified: Secondary | ICD-10-CM

## 2017-06-15 DIAGNOSIS — D472 Monoclonal gammopathy: Secondary | ICD-10-CM | POA: Diagnosis not present

## 2017-06-15 DIAGNOSIS — F419 Anxiety disorder, unspecified: Secondary | ICD-10-CM

## 2017-06-15 DIAGNOSIS — I10 Essential (primary) hypertension: Secondary | ICD-10-CM | POA: Diagnosis not present

## 2017-06-15 NOTE — Assessment & Plan Note (Addendum)
#  MGUS-IgM lambda- most recent M protein  MARCH 2019-1.4 g/dL [June 2016-0.9 g].  CBC within normal limits except for mildly low hemoglobin of 10.5.  Monitor for now.   # Patient has high-risk MGUS [progression to [facility] versus lymphoma; multiple myeloma]- and the suspected patient's neuropathy is related to her MGUS. Will keep monitoring for now.   #  Chronic neuropathy-nortryptline/ gabapentin- STABLE/ but not worse. [Dr.Potter]; defer to neurology regarding follow-up.  # History of breast cancer- ER/PR positive high risk/stage III. Clinically no evidence of recurrence.  #Recommend follow-up in 6 months/labs myeloma panel a few days prior.  # follow in 6 months with labs few days prior.

## 2017-06-15 NOTE — Progress Notes (Signed)
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 degen changes; SEP 2018-M spike- 1.3 gm/dl  # 2016-Temporal A Bx-neg [headaches/diziness].   # Bil LE PN [Dr.Potter- on Neurontin]  # 1997-LEFT BREAST CA Stage III  ER-POS;s/p MASTEC;s/p chemo; s/p RT NSABP- 33; s/p Aromasin.   # ESR- Elevated ? 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  Patient continues to complain of tingling and numbness of the lower extremities which is chronic.  She continues to be on Neurontin.  This is stable.  This is not getting any worse.  Otherwise not losing any weight. No nausea vomiting. No other aches and pains.  Denies any lumps or bumps.  Denies any shortness of breath or chest pain.  Denies any cough.  Denies any headaches.  REVIEW OF SYSTEMS:  A complete 10 point review of system is done which is negative except mentioned above/history of present illness.   PAST MEDICAL HISTORY :  Past Medical History:  Diagnosis Date  . Anxiety   . Arthritis   . Breast cancer ([place])    left breast cancer  . Cancer ([place])   . Environmental and seasonal allergies   . GERD (gastroesophageal reflux disease)   . History of left breast cancer   . Hyperlipidemia   . Hypertension   . Left bundle branch block (LBBB)   . Neuropathy     PAST SURGICAL HISTORY :   Past Surgical History:  Procedure Laterality Date  . BACK SURGERY    . BREAST SURGERY    . CARDIAC CATHETERIZATION    . DILATION AND CURETTAGE OF  UTERUS    . EYE SURGERY Bilateral    Cataract Extraction with IOL  . KNEE ARTHROPLASTY Right 09/14/2015   Procedure: COMPUTER ASSISTED TOTAL KNEE ARTHROPLASTY;  Surgeon: Dereck Leep, MD;  Location: [facility] ORS;  Service: Orthopedics;  Laterality: Right;  . KNEE ARTHROPLASTY Left 05/30/2016   Procedure: COMPUTER ASSISTED TOTAL KNEE ARTHROPLASTY;  Surgeon: Dereck Leep, MD;  Location: [facility] ORS;  Service: Orthopedics;  Laterality: Left;  . KYPHOPLASTY  2016   Dr. Rudene Christians, [facility]  . MASTECTOMY Left 1997   chemo rad mastectomy  . TONSILLECTOMY      FAMILY HISTORY :  History reviewed. No pertinent family history.  SOCIAL HISTORY:   Social History   Tobacco Use  . Smoking status: Never Smoker  . Smokeless tobacco: Never Used  Substance Use Topics  . Alcohol use: Yes    Alcohol/week: 0.6 oz    Types: 1 Glasses of wine per week    Comment: occ.  . Drug use: No    ALLERGIES:  is allergic to adhesive [tape] and ultram [tramadol].  MEDICATIONS:  Current Outpatient Medications  Medication Sig Dispense Refill  . acetaminophen (TYLENOL) 500 MG tablet Take 1,000 mg by mouth 2 (two) times daily as needed for mild pain or headache.     . carvedilol (COREG) 3.125 MG tablet Take 3.125 mg by mouth 2 (two) times daily.    Marland Kitchen  celecoxib (CELEBREX) 200 MG capsule Take 200 mg by mouth daily.    . cetirizine (ZYRTEC) 10 MG tablet Take 10 mg by mouth daily.    . Cholecalciferol (VITAMIN D3) 2000 units TABS Take 2,000 Units by mouth daily.    Marland Kitchen gabapentin (NEURONTIN) 100 MG capsule Take 200 mg by mouth 2 (two) times daily.    Marland Kitchen losartan (COZAAR) 100 MG tablet Take 100 mg by mouth daily.   11  . omeprazole (PRILOSEC) 20 MG capsule Take 20 mg by mouth daily.    . sennosides-docusate sodium (SENOKOT-S) 8.6-50 MG tablet Take 4 tablets by mouth at bedtime.     . simvastatin (ZOCOR) 40 MG tablet Take 40 mg by mouth at bedtime.    . clobetasol ointment (TEMOVATE) 0.05 % APPLY TO AFFECTED AREA DAILY AS NEEDED for  rash on buttocks  3  . fluticasone (FLONASE) 50 MCG/ACT nasal spray Place 2 sprays into both nostrils 2 (two) times daily as needed for rhinitis.     No current facility-administered medications for this visit.     PHYSICAL EXAMINATION:  BP (!) 153/73 (BP Location: Left Arm, Patient Position: Sitting)   Pulse 73   Temp 97.6 F (36.4 C) (Tympanic)   Resp 20   Ht 5' 6.5" (1.689 m)   Wt 158 lb (71.7 kg)   BMI 25.12 kg/m   Filed Weights   06/15/17 1058  Weight: 158 lb (71.7 kg)    GENERAL: Well-nourished well-developed; Alert, no distress and comfortable.  She is alone.  EYES: no pallor or icterus OROPHARYNX: no thrush or ulceration; good dentition  NECK: supple, no masses felt LYMPH:  no palpable lymphadenopathy in the cervical, axillary or inguinal regions LUNGS: clear to auscultation and  No wheeze or crackles HEART/CVS: regular rate & rhythm and no murmurs; No lower extremity edema ABDOMEN:abdomen soft, non-tender and normal bowel sounds Musculoskeletal:no cyanosis of digits and no clubbing  PSYCH: alert & oriented x 3 with fluent speech NEURO: no focal motor/sensory deficits SKIN:  no rashes or significant lesions  LABORATORY DATA:  I have reviewed the data as listed    Component Value Date/Time   NA 131 (L) 06/08/2017 0902   NA 132 (L) 02/02/2014 1947   K 4.2 06/08/2017 0902   K 4.3 02/02/2014 1947   CL 99 (L) 06/08/2017 0902   CL 100 02/02/2014 1947   CO2 23 06/08/2017 0902   CO2 21 02/02/2014 1947   GLUCOSE 97 06/08/2017 0902   GLUCOSE 107 (H) 02/02/2014 1947   BUN 23 (H) 06/08/2017 0902   BUN 22 (H) 02/02/2014 1947   CREATININE 0.73 06/08/2017 0902   CREATININE 0.94 02/02/2014 1947   CALCIUM 9.9 06/08/2017 0902   CALCIUM 10.2 12/08/2014 1540   PROT 8.1 06/08/2017 0902   PROT 7.7 10/21/2013 1106   ALBUMIN 3.8 06/08/2017 0902   ALBUMIN 3.3 (L) 10/21/2013 1106   AST 14 (L) 06/08/2017 0902   AST 16 10/21/2013 1106   ALT 9 (L) 06/08/2017 0902   ALT 25  10/21/2013 1106   ALKPHOS 86 06/08/2017 0902   ALKPHOS 83 10/21/2013 1106   BILITOT 0.8 06/08/2017 0902   BILITOT 0.5 10/21/2013 1106   GFRNONAA >60 06/08/2017 0902   GFRNONAA >60 02/02/2014 1947   GFRNONAA 57 (L) 10/21/2013 1106   GFRAA >60 06/08/2017 0902   GFRAA >60 02/02/2014 1947   GFRAA >60 10/21/2013 1106    No results found for: SPEP, UPEP  Lab Results  Component Value Date  WBC 8.5 06/08/2017   NEUTROABS 6.4 06/08/2017   HGB 11.5 (L) 06/08/2017   HCT 33.4 (L) 06/08/2017   MCV 83.5 06/08/2017   PLT 394 06/08/2017      Chemistry      Component Value Date/Time   NA 131 (L) 06/08/2017 0902   NA 132 (L) 02/02/2014 1947   K 4.2 06/08/2017 0902   K 4.3 02/02/2014 1947   CL 99 (L) 06/08/2017 0902   CL 100 02/02/2014 1947   CO2 23 06/08/2017 0902   CO2 21 02/02/2014 1947   BUN 23 (H) 06/08/2017 0902   BUN 22 (H) 02/02/2014 1947   CREATININE 0.73 06/08/2017 0902   CREATININE 0.94 02/02/2014 1947      Component Value Date/Time   CALCIUM 9.9 06/08/2017 0902   CALCIUM 10.2 12/08/2014 1540   ALKPHOS 86 06/08/2017 0902   ALKPHOS 83 10/21/2013 1106   AST 14 (L) 06/08/2017 0902   AST 16 10/21/2013 1106   ALT 9 (L) 06/08/2017 0902   ALT 25 10/21/2013 1106   BILITOT 0.8 06/08/2017 0902   BILITOT 0.5 10/21/2013 1106     Results for Ulbrich, Alberto T (MRN [id number]) as of 06/15/2017 11:14  Ref. Range 03/29/2004 10:08 03/29/2004 12:10 03/28/2005 13:21 10/18/2005 08:19 11/03/2005 09:01 01/19/2006 09:44 04/03/2006 14:11 03/30/2007 09:28 05/15/2007 06:17 05/16/2007 08:50 04/04/2008 08:04 03/30/2009 13:37 04/21/2009 09:43 08/02/2009 09:03 04/01/2010 16:37 04/19/2010 14:38 04/26/2010 11:25 05/24/2010 08:18 03/10/2011 13:13 05/03/2011 08:42 09/13/2011 09:24 03/27/2012 09:04 05/08/2012 08:51 09/27/2012 09:07 04/04/2013 08:20 04/28/2013 07:39 04/28/2013 07:51 04/28/2013 08:19 04/28/2013 10:22 04/28/2013 10:31 04/28/2013 11:13 04/28/2013 12:57 04/28/2013 13:08 04/28/2013 13:24 04/28/2013 16:07 04/29/2013 05:21 05/28/2013 10:00 06/07/2013  13:36 06/07/2013 13:40 06/07/2013 14:02 10/21/2013 11:06 02/02/2014 19:43 02/02/2014 19:47 02/02/2014 20:10 02/02/2014 22:38 02/02/2014 23:47 02/03/2014 04:19 05/13/2014 09:19 05/20/2014 00:00 05/20/2014 10:14 06/25/2014 09:07 09/24/2014 13:10 09/24/2014 13:10 09/24/2014 13:26 09/24/2014 14:15 10/22/2014 09:16 11/27/2014 11:24 12/03/2014 15:34 12/08/2014 15:40 12/15/2014 15:13 01/14/2015 14:20 01/14/2015 14:21 01/21/2015 10:23 01/23/2015 13:39 01/23/2015 14:32 05/05/2015 09:54 05/05/2015 09:55 06/30/2015 09:41 09/02/2015 09:41 09/02/2015 09:44 09/14/2015 15:41 09/14/2015 20:57 09/15/2015 03:17 09/16/2015 03:08 12/02/2015 10:46 05/23/2016 11:01 05/23/2016 11:15 05/30/2016 16:05 05/31/2016 03:33 06/01/2016 04:49 08/02/2016 12:10 09/05/2016 16:45 10/20/2016 13:36 12/07/2016 12:51 06/08/2017 08:50 06/08/2017 09:02  M Protein SerPl Elph-Mcnc Latest Ref Range: Not Observed g/dL                                                             1.0 (H)      0.8 (H)        0.9 (H)         1.3 (H) 1.4 (H)      RADIOGRAPHIC STUDIES: I have personally reviewed the radiological images as listed and agreed with the findings in the report. No results found.   ASSESSMENT & PLAN:  MGUS (monoclonal gammopathy of unknown significance) # MGUS-IgM lambda- most recent M protein  MARCH 2019-1.4 g/dL [June 2016-0.9 g].  CBC within normal limits except for mildly low hemoglobin of 10.5.  Monitor for now.   # Patient has high-risk MGUS [progression to [facility] versus lymphoma; multiple myeloma]- and the suspected patient's neuropathy is related to her MGUS. Will keep monitoring for now.   #  Chronic neuropathy-nortryptline/ gabapentin- STABLE/ but not worse. [Dr.Potter]; defer to neurology regarding follow-up.  #  History of breast cancer- ER/PR positive high risk/stage III. Clinically no evidence of recurrence.  #Recommend follow-up in 6 months/labs myeloma panel a few days prior.  # follow in 6 months with labs few days prior.      Cammie Sickle, MD 06/15/2017  2:07 PM

## 2017-08-18 ENCOUNTER — Encounter: Payer: Self-pay | Admitting: Internal Medicine

## 2017-09-18 ENCOUNTER — Other Ambulatory Visit: Payer: Self-pay | Admitting: Internal Medicine

## 2017-09-18 DIAGNOSIS — Z1231 Encounter for screening mammogram for malignant neoplasm of breast: Secondary | ICD-10-CM

## 2017-10-11 ENCOUNTER — Ambulatory Visit
Admission: RE | Admit: 2017-10-11 | Discharge: 2017-10-11 | Disposition: A | Discharge status: Home / Self Care | Payer: Medicare Other | Source: Ambulatory Visit | Attending: Internal Medicine | Admitting: Internal Medicine

## 2017-10-11 DIAGNOSIS — Z1231 Encounter for screening mammogram for malignant neoplasm of breast: Secondary | ICD-10-CM | POA: Diagnosis not present

## 2017-12-07 ENCOUNTER — Inpatient Hospital Stay: Payer: Medicare Other | Attending: Internal Medicine

## 2017-12-07 DIAGNOSIS — Z79899 Other long term (current) drug therapy: Secondary | ICD-10-CM | POA: Diagnosis not present

## 2017-12-07 DIAGNOSIS — G629 Polyneuropathy, unspecified: Secondary | ICD-10-CM | POA: Diagnosis not present

## 2017-12-07 DIAGNOSIS — I447 Left bundle-branch block, unspecified: Secondary | ICD-10-CM | POA: Diagnosis not present

## 2017-12-07 DIAGNOSIS — R5383 Other fatigue: Secondary | ICD-10-CM | POA: Insufficient documentation

## 2017-12-07 DIAGNOSIS — Z9221 Personal history of antineoplastic chemotherapy: Secondary | ICD-10-CM | POA: Diagnosis not present

## 2017-12-07 DIAGNOSIS — D472 Monoclonal gammopathy: Secondary | ICD-10-CM | POA: Insufficient documentation

## 2017-12-07 DIAGNOSIS — M129 Arthropathy, unspecified: Secondary | ICD-10-CM | POA: Diagnosis not present

## 2017-12-07 DIAGNOSIS — Z853 Personal history of malignant neoplasm of breast: Secondary | ICD-10-CM | POA: Insufficient documentation

## 2017-12-07 DIAGNOSIS — R0602 Shortness of breath: Secondary | ICD-10-CM | POA: Diagnosis not present

## 2017-12-07 DIAGNOSIS — E785 Hyperlipidemia, unspecified: Secondary | ICD-10-CM | POA: Insufficient documentation

## 2017-12-07 DIAGNOSIS — I10 Essential (primary) hypertension: Secondary | ICD-10-CM | POA: Diagnosis not present

## 2017-12-07 DIAGNOSIS — Z9012 Acquired absence of left breast and nipple: Secondary | ICD-10-CM | POA: Insufficient documentation

## 2017-12-07 DIAGNOSIS — C880 Waldenstrom macroglobulinemia: Secondary | ICD-10-CM | POA: Insufficient documentation

## 2017-12-07 DIAGNOSIS — K219 Gastro-esophageal reflux disease without esophagitis: Secondary | ICD-10-CM | POA: Insufficient documentation

## 2017-12-07 DIAGNOSIS — F419 Anxiety disorder, unspecified: Secondary | ICD-10-CM | POA: Insufficient documentation

## 2017-12-07 LAB — COMPREHENSIVE METABOLIC PANEL
ALBUMIN: 3.7 g/dL (ref 3.5–5.0)
ALT: 11 U/L (ref 0–44)
AST: 14 U/L — AB (ref 15–41)
Alkaline Phosphatase: 64 U/L (ref 38–126)
Anion gap: 7 (ref 5–15)
BUN: 10 mg/dL (ref 8–23)
CHLORIDE: 101 mmol/L (ref 98–111)
CO2: 28 mmol/L (ref 22–32)
Calcium: 9.9 mg/dL (ref 8.9–10.3)
Creatinine, Ser: 0.73 mg/dL (ref 0.44–1.00)
GFR calc Af Amer: >60 mL/min (ref >60)
GFR calc non Af Amer: >60 mL/min (ref >60)
GLUCOSE: 99 mg/dL (ref 70–99)
POTASSIUM: 3.5 mmol/L (ref 3.5–5.1)
Sodium: 136 mmol/L (ref 135–145)
Total Bilirubin: 0.4 mg/dL (ref 0.3–1.2)
Total Protein: 7.9 g/dL (ref 6.5–8.1)

## 2017-12-07 LAB — CBC WITH DIFFERENTIAL/PLATELET
Basophils Absolute: 0.1 10*3/uL (ref 0–0.1)
Basophils Relative: 1 %
EOS PCT: 2 %
Eosinophils Absolute: 0.1 10*3/uL (ref 0–0.7)
HCT: 35.1 % (ref 35.0–47.0)
Hemoglobin: 12 g/dL (ref 12.0–16.0)
LYMPHS ABS: 0.9 10*3/uL — AB (ref 1.0–3.6)
LYMPHS PCT: 15 %
MCH: 29.6 pg (ref 26.0–34.0)
MCHC: 34.3 g/dL (ref 32.0–36.0)
MCV: 86.5 fL (ref 80.0–100.0)
MONO ABS: 0.6 10*3/uL (ref 0.2–0.9)
Monocytes Relative: 11 %
Neutro Abs: 4.2 10*3/uL (ref 1.4–6.5)
Neutrophils Relative %: 71 %
PLATELETS: 363 10*3/uL (ref 150–440)
RBC: 4.06 MIL/uL (ref 3.80–5.20)
RDW: 14.1 % (ref 11.5–14.5)
WBC: 5.8 10*3/uL (ref 3.6–11.0)

## 2017-12-08 LAB — KAPPA/LAMBDA LIGHT CHAINS
KAPPA, LAMDA LIGHT CHAIN RATIO: 0.26 (ref 0.26–1.65)
Kappa free light chain: 6.8 mg/L (ref 3.3–19.4)
LAMDA FREE LIGHT CHAINS: 25.9 mg/L (ref 5.7–26.3)

## 2017-12-12 LAB — MULTIPLE MYELOMA PANEL, SERUM
ALBUMIN/GLOB SERPL: 1 (ref 0.7–1.7)
Albumin SerPl Elph-Mcnc: 3.6 g/dL (ref 2.9–4.4)
Alpha 1: 0.4 g/dL (ref 0.0–0.4)
Alpha2 Glob SerPl Elph-Mcnc: 0.8 g/dL (ref 0.4–1.0)
B-GLOBULIN SERPL ELPH-MCNC: 1.1 g/dL (ref 0.7–1.3)
GAMMA GLOB SERPL ELPH-MCNC: 1.6 g/dL (ref 0.4–1.8)
GLOBULIN, TOTAL: 3.8 g/dL (ref 2.2–3.9)
IgA: 26 mg/dL — ABNORMAL LOW (ref 64–422)
IgG (Immunoglobin G), Serum: 348 mg/dL — ABNORMAL LOW (ref 700–1600)
IgM (Immunoglobulin M), Srm: 2557 mg/dL — ABNORMAL HIGH (ref 26–217)
M PROTEIN SERPL ELPH-MCNC: 1.2 g/dL — AB
Total Protein ELP: 7.4 g/dL (ref 6.0–8.5)

## 2017-12-14 ENCOUNTER — Other Ambulatory Visit: Payer: Medicare Other

## 2017-12-14 ENCOUNTER — Encounter: Payer: Self-pay | Admitting: Internal Medicine

## 2017-12-14 ENCOUNTER — Other Ambulatory Visit: Payer: Self-pay

## 2017-12-14 ENCOUNTER — Inpatient Hospital Stay: Payer: Medicare Other | Admitting: Internal Medicine

## 2017-12-14 VITALS — BP 165/83 | HR 73 | Temp 97.6°F | Resp 20 | Ht 66.5 in | Wt 157.0 lb

## 2017-12-14 DIAGNOSIS — C880 Waldenstrom macroglobulinemia: Secondary | ICD-10-CM | POA: Diagnosis not present

## 2017-12-14 DIAGNOSIS — D472 Monoclonal gammopathy: Secondary | ICD-10-CM | POA: Diagnosis not present

## 2017-12-14 DIAGNOSIS — I10 Essential (primary) hypertension: Secondary | ICD-10-CM

## 2017-12-14 DIAGNOSIS — E785 Hyperlipidemia, unspecified: Secondary | ICD-10-CM

## 2017-12-14 DIAGNOSIS — R0602 Shortness of breath: Secondary | ICD-10-CM

## 2017-12-14 DIAGNOSIS — F419 Anxiety disorder, unspecified: Secondary | ICD-10-CM

## 2017-12-14 DIAGNOSIS — I447 Left bundle-branch block, unspecified: Secondary | ICD-10-CM

## 2017-12-14 DIAGNOSIS — G629 Polyneuropathy, unspecified: Secondary | ICD-10-CM

## 2017-12-14 DIAGNOSIS — R5383 Other fatigue: Secondary | ICD-10-CM

## 2017-12-14 DIAGNOSIS — Z9221 Personal history of antineoplastic chemotherapy: Secondary | ICD-10-CM

## 2017-12-14 DIAGNOSIS — M129 Arthropathy, unspecified: Secondary | ICD-10-CM

## 2017-12-14 DIAGNOSIS — Z9012 Acquired absence of left breast and nipple: Secondary | ICD-10-CM

## 2017-12-14 DIAGNOSIS — K219 Gastro-esophageal reflux disease without esophagitis: Secondary | ICD-10-CM

## 2017-12-14 DIAGNOSIS — Z853 Personal history of malignant neoplasm of breast: Secondary | ICD-10-CM

## 2017-12-14 NOTE — Progress Notes (Signed)
Patient reports intermittent "brownish vaginal discharge" that appears on the sanitary pad. Patient reports urinary incontinence. She is uncertain if the stain is r/t to her incontinence vs vaginal bleeding. She states that she has no rectal bleeding. No history of hemorrhoids. Patient has never had a hysterectomy. Patient reports intermittent low back pain that radiates to the pelvic region. Pt reports taking at least 4 Senakot daily at bedtime for constipation. Patient has chronic constipation. If she takes the senakot daily, then she is able to have a bowel movement daily. She denies any nausea and vomiting. She reports intermittent headache, but does not interfere with daily function.

## 2017-12-14 NOTE — Progress Notes (Signed)
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 degen changes; SEP 2018-M spike- 1.3 gm/dl  # 2016-Temporal A Bx-neg [headaches/diziness].   # Bil LE PN [Carly Peck- on Neurontin]  # 1997-LEFT BREAST CA Stage III  ER-POS;s/p MASTEC;s/p chemo; s/p RT NSABP- 33; s/p Aromasin.   # ESR- Elevated ? From Monoclonal gammopathy ----------------------------------------------------   DIAGNOSIS: IgM- MGUS   CURRENT/MOST RECENT THERAPY: surveillaince      Macroglobulinemia of Waldenstrom ([place])     INTERVAL HISTORY:  84 year old female patient with above history of IgM-MGUS is here for follow-up./Reviewed the use of her labs.  Patient denies any nausea vomiting.  Denies any aches and pains.  Continues to have chronic tingling and numbness of her extremities especially the lower limbs which is chronic.  She continues to take Neurontin.  She does complain of however fatigue.  Shortness of breath on exertion.  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  Review of Systems  Constitutional: Positive for malaise/fatigue. Negative for chills, diaphoresis, fever and weight loss.  HENT: Negative for nosebleeds and sore throat.   Eyes: Negative for double vision.  Respiratory: Positive for shortness of breath. Negative for cough, hemoptysis, sputum production and wheezing.   Cardiovascular: Negative for chest pain, palpitations, orthopnea and leg swelling.  Gastrointestinal: Negative for abdominal pain, blood in stool, constipation, diarrhea,  heartburn, melena, nausea and vomiting.  Genitourinary: Negative for dysuria, frequency and urgency.  Musculoskeletal: Negative for back pain and joint pain.  Skin: Negative.  Negative for itching and rash.  Neurological: Positive for tingling. Negative for dizziness, focal weakness, weakness and headaches.  Endo/Heme/Allergies: Does not bruise/bleed easily.  Psychiatric/Behavioral: Negative for depression. The patient is not nervous/anxious and does not have insomnia.      PAST MEDICAL HISTORY :  Past Medical History:  Diagnosis Date  . Anxiety   . Arthritis   . Breast cancer ([place])    left breast cancer  . Cancer ([place])   . Environmental and seasonal allergies   . GERD (gastroesophageal reflux disease)   . History of left breast cancer   . Hyperlipidemia   . Hypertension   . Left bundle branch block (LBBB)   . Neuropathy     PAST SURGICAL HISTORY :   Past Surgical History:  Procedure Laterality Date  . BACK SURGERY    . BREAST SURGERY    . CARDIAC CATHETERIZATION    . DILATION AND CURETTAGE OF UTERUS    . EYE SURGERY Bilateral    Cataract Extraction with IOL  . KNEE ARTHROPLASTY Right 09/14/2015   Procedure: COMPUTER ASSISTED TOTAL KNEE ARTHROPLASTY;  Surgeon: Carly Leep, MD;  Location: [facility] ORS;  Service: Orthopedics;  Laterality: Right;  . KNEE ARTHROPLASTY Left 05/30/2016   Procedure: COMPUTER ASSISTED TOTAL KNEE ARTHROPLASTY;  Surgeon: Carly Leep, MD;  Location: [facility] ORS;  Service: Orthopedics;  Laterality: Left;  . KYPHOPLASTY  2016   Dr. Rudene Peck, [facility]  . MASTECTOMY Left 1997   chemo rad mastectomy  . TONSILLECTOMY      FAMILY HISTORY :  Family History  Problem Relation Age of Onset  . Breast cancer Neg Hx     SOCIAL HISTORY:   Social History   Tobacco Use  . Smoking status: Never Smoker  . Smokeless tobacco: Never Used  Substance Use Topics  . Alcohol use: Yes    Alcohol/week: 1.0 standard drinks    Types: 1 Glasses of wine per week    Comment:  occ.  . Drug use: No    ALLERGIES:  is allergic to adhesive [tape] and ultram [tramadol].  MEDICATIONS:  Current Outpatient Medications  Medication Sig Dispense Refill  . acetaminophen (TYLENOL) 500 MG tablet Take 1,000 mg by mouth 2 (two) times daily as needed for mild pain or headache.     . carvedilol (COREG) 3.125 MG tablet Take 3.125 mg by mouth 2 (two) times daily.    . celecoxib (CELEBREX) 200 MG capsule Take 200 mg by mouth daily.    . Cholecalciferol (VITAMIN D3) 2000 units TABS Take 2,000 Units by mouth daily.    . clobetasol ointment (TEMOVATE) 0.05 % APPLY TO AFFECTED AREA DAILY AS NEEDED for rash on buttocks  3  . fluticasone (FLONASE) 50 MCG/ACT nasal spray Place 2 sprays into both nostrils 2 (two) times daily as needed for rhinitis.    Marland Kitchen gabapentin (NEURONTIN) 100 MG capsule Take 200 mg by mouth 2 (two) times daily.    Marland Kitchen losartan (COZAAR) 100 MG tablet Take 100 mg by mouth daily.   11  . omeprazole (PRILOSEC) 20 MG capsule Take 20 mg by mouth daily.    . sennosides-docusate sodium (SENOKOT-S) 8.6-50 MG tablet Take 4 tablets by mouth at bedtime.     . simvastatin (ZOCOR) 40 MG tablet Take 40 mg by mouth at bedtime.    . cetirizine (ZYRTEC) 10 MG tablet Take 10 mg by mouth daily.     No current facility-administered medications for this visit.     PHYSICAL EXAMINATION:  BP (!) 165/83   Pulse 73   Temp 97.6 F (36.4 C) (Tympanic)   Resp 20   Ht 5' 6.5" (1.689 m)   Wt 157 lb (71.2 kg)   BMI 24.96 kg/m   Filed Weights   12/14/17 1042  Weight: 157 lb (71.2 kg)    Physical Exam  Constitutional: She is oriented to person, place, and time and well-developed, well-nourished, and in no distress.  HENT:  Head: Normocephalic and atraumatic.  Mouth/Throat: Oropharynx is clear and moist. No oropharyngeal exudate.  Eyes: Pupils are equal, round, and reactive to light.  Neck: Normal range of motion. Neck supple.  Cardiovascular: Normal rate and regular rhythm.   Pulmonary/Chest: No respiratory distress. She has no wheezes.  Abdominal: Soft. Bowel sounds are normal. She exhibits no distension and no mass. There is no tenderness. There is no rebound and no guarding.  Musculoskeletal: Normal range of motion. She exhibits no edema or tenderness.  Neurological: She is alert and oriented to person, place, and time.  Skin: Skin is warm.  Psychiatric: Affect normal.    LABORATORY DATA:  I have reviewed the data as listed    Component Value Date/Time   NA 136 12/07/2017 0926   NA 132 (L) 02/02/2014 1947   K 3.5 12/07/2017 0926   K 4.3 02/02/2014 1947   CL 101 12/07/2017 0926   CL 100 02/02/2014 1947   CO2 28 12/07/2017 0926   CO2 21 02/02/2014 1947   GLUCOSE 99 12/07/2017 0926   GLUCOSE 107 (H) 02/02/2014 1947  BUN 10 12/07/2017 0926   BUN 22 (H) 02/02/2014 1947   CREATININE 0.73 12/07/2017 0926   CREATININE 0.94 02/02/2014 1947   CALCIUM 9.9 12/07/2017 0926   CALCIUM 10.2 12/08/2014 1540   PROT 7.9 12/07/2017 0926   PROT 7.7 10/21/2013 1106   ALBUMIN 3.7 12/07/2017 0926   ALBUMIN 3.3 (L) 10/21/2013 1106   AST 14 (L) 12/07/2017 0926   AST 16 10/21/2013 1106   ALT 11 12/07/2017 0926   ALT 25 10/21/2013 1106   ALKPHOS 64 12/07/2017 0926   ALKPHOS 83 10/21/2013 1106   BILITOT 0.4 12/07/2017 0926   BILITOT 0.5 10/21/2013 1106   GFRNONAA >60 12/07/2017 0926   GFRNONAA >60 02/02/2014 1947   GFRNONAA 57 (L) 10/21/2013 1106   GFRAA >60 12/07/2017 0926   GFRAA >60 02/02/2014 1947   GFRAA >60 10/21/2013 1106    No results found for: SPEP, UPEP  Lab Results  Component Value Date   WBC 5.8 12/07/2017   NEUTROABS 4.2 12/07/2017   HGB 12.0 12/07/2017   HCT 35.1 12/07/2017   MCV 86.5 12/07/2017   PLT 363 12/07/2017      Chemistry      Component Value Date/Time   NA 136 12/07/2017 0926   NA 132 (L) 02/02/2014 1947   K 3.5 12/07/2017 0926   K 4.3 02/02/2014 1947   CL 101 12/07/2017 0926   CL 100 02/02/2014 1947   CO2 28  12/07/2017 0926   CO2 21 02/02/2014 1947   BUN 10 12/07/2017 0926   BUN 22 (H) 02/02/2014 1947   CREATININE 0.73 12/07/2017 0926   CREATININE 0.94 02/02/2014 1947      Component Value Date/Time   CALCIUM 9.9 12/07/2017 0926   CALCIUM 10.2 12/08/2014 1540   ALKPHOS 64 12/07/2017 0926   ALKPHOS 83 10/21/2013 1106   AST 14 (L) 12/07/2017 0926   AST 16 10/21/2013 1106   ALT 11 12/07/2017 0926   ALT 25 10/21/2013 1106   BILITOT 0.4 12/07/2017 0926   BILITOT 0.5 10/21/2013 1106     Results for Peck, Carly Peck (MRN [id number]) as of 06/15/2017 11:14  Ref. Range 03/29/2004 10:08 03/29/2004 12:10 03/28/2005 13:21 10/18/2005 08:19 11/03/2005 09:01 01/19/2006 09:44 04/03/2006 14:11 03/30/2007 09:28 05/15/2007 06:17 05/16/2007 08:50 04/04/2008 08:04 03/30/2009 13:37 04/21/2009 09:43 08/02/2009 09:03 04/01/2010 16:37 04/19/2010 14:38 04/26/2010 11:25 05/24/2010 08:18 03/10/2011 13:13 05/03/2011 08:42 09/13/2011 09:24 03/27/2012 09:04 05/08/2012 08:51 09/27/2012 09:07 04/04/2013 08:20 04/28/2013 07:39 04/28/2013 07:51 04/28/2013 08:19 04/28/2013 10:22 04/28/2013 10:31 04/28/2013 11:13 04/28/2013 12:57 04/28/2013 13:08 04/28/2013 13:24 04/28/2013 16:07 04/29/2013 05:21 05/28/2013 10:00 06/07/2013 13:36 06/07/2013 13:40 06/07/2013 14:02 10/21/2013 11:06 02/02/2014 19:43 02/02/2014 19:47 02/02/2014 20:10 02/02/2014 22:38 02/02/2014 23:47 02/03/2014 04:19 05/13/2014 09:19 05/20/2014 00:00 05/20/2014 10:14 06/25/2014 09:07 09/24/2014 13:10 09/24/2014 13:10 09/24/2014 13:26 09/24/2014 14:15 10/22/2014 09:16 11/27/2014 11:24 12/03/2014 15:34 12/08/2014 15:40 12/15/2014 15:13 01/14/2015 14:20 01/14/2015 14:21 01/21/2015 10:23 01/23/2015 13:39 01/23/2015 14:32 05/05/2015 09:54 05/05/2015 09:55 06/30/2015 09:41 09/02/2015 09:41 09/02/2015 09:44 09/14/2015 15:41 09/14/2015 20:57 09/15/2015 03:17 09/16/2015 03:08 12/02/2015 10:46 05/23/2016 11:01 05/23/2016 11:15 05/30/2016 16:05 05/31/2016 03:33 06/01/2016 04:49 08/02/2016 12:10 09/05/2016 16:45 10/20/2016 13:36 12/07/2016 12:51 06/08/2017 08:50 06/08/2017 09:02  M Protein SerPl  [facility]ph-[facility] Latest Ref Range: Not Observed g/dL  1.0 (H)      0.8 (H)        0.9 (H)         1.3 (H) 1.4 (H)      RADIOGRAPHIC STUDIES: I have personally reviewed the radiological images as listed and agreed with the findings in the report. No results found.   ASSESSMENT & PLAN:  MGUS (monoclonal gammopathy of unknown significance) # MGUS-IgM lambda- most recent M protein- SEP 2019-1.2 g/dL; otherwise rest of the CBC normal.  STABLE.  # Chronic neuropathy-nortryptline/ gabapentin- STABLE;  [Carly Peck];   #Chronically elevated ESR 80s to 90s; CRP normal.  Continue monitor.  # History of breast cancer- ER/PR positive high risk/stage III. Clinically no evidence of recurrence. STABLE.   # Elevated Blood pressure- recommend checking at home; if not controlled defer to PCP.   # follow in 6 months with labs few days prior.   Cc; Dr.Anderson.      Cammie Sickle, MD 12/17/2017 7:59 PM

## 2017-12-14 NOTE — Assessment & Plan Note (Addendum)
#  MGUS-IgM lambda- most recent M protein- SEP 2019-1.2 g/dL; otherwise rest of the CBC normal.  STABLE.  # Chronic neuropathy-nortryptline/ gabapentin- STABLE;  [Dr.Potter];   #Chronically elevated ESR 80s to 90s; CRP normal.  Continue monitor.  # History of breast cancer- ER/PR positive high risk/stage III. Clinically no evidence of recurrence. STABLE.   # Elevated Blood pressure- recommend checking at home; if not controlled defer to PCP.   # follow in 6 months with labs few days prior.   Cc; Dr.Anderson.

## 2018-06-07 ENCOUNTER — Inpatient Hospital Stay: Payer: Medicare Other

## 2018-06-12 ENCOUNTER — Other Ambulatory Visit: Payer: Medicare Other

## 2018-06-14 ENCOUNTER — Ambulatory Visit: Payer: Medicare Other | Admitting: Internal Medicine

## 2018-06-21 ENCOUNTER — Ambulatory Visit
Admission: RE | Admit: 2018-06-21 | Discharge: 2018-06-21 | Disposition: A | Discharge status: Home / Self Care | Payer: Medicare Other | Source: Ambulatory Visit | Attending: Gastroenterology | Admitting: Gastroenterology

## 2018-06-21 ENCOUNTER — Other Ambulatory Visit: Payer: Self-pay | Admitting: Gastroenterology

## 2018-06-21 ENCOUNTER — Other Ambulatory Visit: Payer: Self-pay

## 2018-06-21 DIAGNOSIS — R1310 Dysphagia, unspecified: Secondary | ICD-10-CM | POA: Diagnosis present

## 2018-06-21 DIAGNOSIS — R490 Dysphonia: Secondary | ICD-10-CM

## 2018-06-21 DIAGNOSIS — K219 Gastro-esophageal reflux disease without esophagitis: Secondary | ICD-10-CM | POA: Insufficient documentation

## 2018-08-06 ENCOUNTER — Other Ambulatory Visit: Payer: Self-pay

## 2018-08-07 ENCOUNTER — Other Ambulatory Visit: Payer: Self-pay

## 2018-08-07 ENCOUNTER — Inpatient Hospital Stay: Payer: Medicare Other | Attending: Internal Medicine

## 2018-08-07 DIAGNOSIS — C880 Waldenstrom macroglobulinemia: Secondary | ICD-10-CM | POA: Diagnosis present

## 2018-08-07 DIAGNOSIS — D472 Monoclonal gammopathy: Secondary | ICD-10-CM

## 2018-08-07 LAB — COMPREHENSIVE METABOLIC PANEL
ALT: 10 U/L (ref 0–44)
AST: 15 U/L (ref 15–41)
Albumin: 3.6 g/dL (ref 3.5–5.0)
Alkaline Phosphatase: 60 U/L (ref 38–126)
Anion gap: 7 (ref 5–15)
BUN: 13 mg/dL (ref 8–23)
CO2: 26 mmol/L (ref 22–32)
Calcium: 9.6 mg/dL (ref 8.9–10.3)
Chloride: 105 mmol/L (ref 98–111)
Creatinine, Ser: 0.78 mg/dL (ref 0.44–1.00)
GFR calc Af Amer: >60 mL/min (ref >60)
GFR calc non Af Amer: >60 mL/min (ref >60)
Glucose, Bld: 100 mg/dL — ABNORMAL HIGH (ref 70–99)
Potassium: 3.9 mmol/L (ref 3.5–5.1)
Sodium: 138 mmol/L (ref 135–145)
Total Bilirubin: 0.8 mg/dL (ref 0.3–1.2)
Total Protein: 7.4 g/dL (ref 6.5–8.1)

## 2018-08-07 LAB — CBC WITH DIFFERENTIAL/PLATELET
Abs Immature Granulocytes: 0.02 10*3/uL (ref 0.00–0.07)
Basophils Absolute: 0 10*3/uL (ref 0.0–0.1)
Basophils Relative: 1 %
Eosinophils Absolute: 0.2 10*3/uL (ref 0.0–0.5)
Eosinophils Relative: 3 %
HCT: 33.8 % — ABNORMAL LOW (ref 36.0–46.0)
Hemoglobin: 10.8 g/dL — ABNORMAL LOW (ref 12.0–15.0)
Immature Granulocytes: 0 %
Lymphocytes Relative: 16 %
Lymphs Abs: 1 10*3/uL (ref 0.7–4.0)
MCH: 28.4 pg (ref 26.0–34.0)
MCHC: 32 g/dL (ref 30.0–36.0)
MCV: 88.9 fL (ref 80.0–100.0)
Monocytes Absolute: 0.8 10*3/uL (ref 0.1–1.0)
Monocytes Relative: 13 %
Neutro Abs: 4.1 10*3/uL (ref 1.7–7.7)
Neutrophils Relative %: 67 %
Platelets: 278 10*3/uL (ref 150–400)
RBC: 3.8 MIL/uL — ABNORMAL LOW (ref 3.87–5.11)
RDW: 13.5 % (ref 11.5–15.5)
WBC: 6.1 10*3/uL (ref 4.0–10.5)
nRBC: 0 % (ref 0.0–0.2)

## 2018-08-07 LAB — SEDIMENTATION RATE: Sed Rate: 78 mm/hr — ABNORMAL HIGH (ref 0–30)

## 2018-08-07 LAB — C-REACTIVE PROTEIN: CRP: <0.8 mg/dL (ref <1.0)

## 2018-08-08 LAB — KAPPA/LAMBDA LIGHT CHAINS
Kappa free light chain: 6 mg/L (ref 3.3–19.4)
Kappa, lambda light chain ratio: 0.24 — ABNORMAL LOW (ref 0.26–1.65)
Lambda free light chains: 24.8 mg/L (ref 5.7–26.3)

## 2018-08-09 LAB — MULTIPLE MYELOMA PANEL, SERUM
Albumin SerPl Elph-Mcnc: 3.6 g/dL (ref 2.9–4.4)
Albumin/Glob SerPl: 1.1 (ref 0.7–1.7)
Alpha 1: 0.4 g/dL (ref 0.0–0.4)
Alpha2 Glob SerPl Elph-Mcnc: 0.9 g/dL (ref 0.4–1.0)
B-Globulin SerPl Elph-Mcnc: 0.9 g/dL (ref 0.7–1.3)
Gamma Glob SerPl Elph-Mcnc: 1.2 g/dL (ref 0.4–1.8)
Globulin, Total: 3.4 g/dL (ref 2.2–3.9)
IgA: 22 mg/dL — ABNORMAL LOW (ref 64–422)
IgG (Immunoglobin G), Serum: 296 mg/dL — ABNORMAL LOW (ref 586–1602)
IgM (Immunoglobulin M), Srm: 2306 mg/dL — ABNORMAL HIGH (ref 26–217)
M Protein SerPl Elph-Mcnc: 1 g/dL — ABNORMAL HIGH
Total Protein ELP: 7 g/dL (ref 6.0–8.5)

## 2018-08-14 ENCOUNTER — Other Ambulatory Visit: Payer: Self-pay

## 2018-08-14 ENCOUNTER — Other Ambulatory Visit: Payer: Self-pay | Admitting: *Deleted

## 2018-08-14 ENCOUNTER — Inpatient Hospital Stay (HOSPITAL_BASED_OUTPATIENT_CLINIC_OR_DEPARTMENT_OTHER): Payer: Medicare Other | Admitting: Internal Medicine

## 2018-08-14 DIAGNOSIS — D472 Monoclonal gammopathy: Secondary | ICD-10-CM | POA: Diagnosis not present

## 2018-08-14 NOTE — Progress Notes (Signed)
I connected with Carly Peck on 08/14/18 at 10:15 AM EDT by video enabled telemedicine visit and verified that I am speaking with the correct person using two identifiers.  I discussed the limitations, risks, security and privacy concerns of performing an evaluation and management service by telemedicine and the availability of in-person appointments. I also discussed with the patient that there may be a patient responsible charge related to this service. The patient expressed understanding and agreed to proceed.    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 degen changes; SEP 2018-M spike- 1.3 gm/dl  # 2016-Temporal A Bx-neg [headaches/diziness].   # Bil LE PN [Dr.Potter- on Neurontin]  # 1997-LEFT BREAST CA Stage III  ER-POS;s/p MASTEC;s/p chemo; s/p RT NSABP- 33; s/p Aromasin.   # ESR- Elevated ? From Monoclonal gammopathy ----------------------------------------------------   DIAGNOSIS: IgM- MGUS   CURRENT/MOST RECENT THERAPY: surveillaince      Macroglobulinemia of Waldenstrom ([facility])     Chief Complaint: MGUS-IgM    History of present illness:Carly Peck 85 y.o.  female with history of IgM MGUS is here for follow-up.  Patient continues to have chronic mild to moderate neuropathy in the lower extremities.  Not any worse.  Appetite is fair.  No weight loss.  No blood in stools or black or stools.  No nausea no vomiting.  No chest pain or shortness of breath or cough.  Observation/objective:  Assessment and plan: MGUS (monoclonal gammopathy of unknown significance) # MGUS-IgM lambda- most recent M protein- MAY 2020-1.0 g/dL;  CBC chemistries fairly unremarkable except for hemoglobin-10.8 [see below]   # Mild intermittent anemia- 10.8; I think it is unrelated to above M protein levels.  Colonoscopy > 10 years;no blood in stools.  Recommend repeating the blood work in 3 months CBC/LDH/iron studies folic acid ferritin B12.   #Chronic neuropathy-nortryptline/ gabapentin- stable.  [Dr.Potter];  #Chronically elevated ESR 80s to 90s; CRP normal. Sstable.  # History of breast cancer- ER/PR positive high risk/stage III. Clinically no evidence of recurrence. Stable.  # DISPOSITION: # in 3 months- labs ONLY- cbc/iron studies/ ferritin/b12/folic acid/LDH.  # Follow up in 6 months-MD- cbc/bmp/esr/ Multiple myeloma panel; kappa/lamda light chains- Dr.B  Cc; Dr.Anderson.   Follow-up instructions:  I discussed the assessment and treatment plan with the patient.  The patient was provided an opportunity to ask questions and all were answered.  The patient agreed with the plan and demonstrated understanding of instructions.  The patient was advised to call back or seek an in person evaluation if the symptoms worsen or if the condition fails to improve as anticipated.  Dr.   [facility] 08/14/2018 11:17 AM 

## 2018-08-14 NOTE — Assessment & Plan Note (Addendum)
#  MGUS-IgM lambda- most recent M protein- MAY 2020-1.0 g/dL; CBC chemistries fairly unremarkable except for hemoglobin-10.8 [see below]   # Mild intermittent anemia- 10.8; I think it is unrelated to above M protein levels.  Colonoscopy > 10 years;no blood in stools.  Recommend repeating the blood work in 3 months CBC/LDH/iron studies folic acid ferritin T55.   #Chronic neuropathy-nortryptline/ gabapentin- stable.  [Dr.Potter];  #Chronically elevated ESR 80s to 90s; CRP normal. Sstable.  # History of breast cancer- ER/PR positive high risk/stage III. Clinically no evidence of recurrence. Stable.  # DISPOSITION: # in 3 months- labs ONLY- cbc/iron studies/ ferritin/b12/folic acid/LDH.  # Follow up in 6 months-MD- cbc/bmp/esr/ Multiple myeloma panel; kappa/lamda light chains- Dr.B  Cc; Dr.Anderson.

## 2018-11-14 ENCOUNTER — Other Ambulatory Visit: Payer: Self-pay

## 2018-11-14 ENCOUNTER — Other Ambulatory Visit: Payer: Medicare Other

## 2018-11-14 ENCOUNTER — Inpatient Hospital Stay: Payer: Medicare Other | Attending: Internal Medicine

## 2018-11-14 DIAGNOSIS — D509 Iron deficiency anemia, unspecified: Secondary | ICD-10-CM | POA: Insufficient documentation

## 2018-11-14 DIAGNOSIS — D472 Monoclonal gammopathy: Secondary | ICD-10-CM | POA: Diagnosis present

## 2018-11-14 LAB — CBC WITH DIFFERENTIAL/PLATELET
Abs Immature Granulocytes: 0.02 10*3/uL (ref 0.00–0.07)
Basophils Absolute: 0 10*3/uL (ref 0.0–0.1)
Basophils Relative: 0 %
Eosinophils Absolute: 0.1 10*3/uL (ref 0.0–0.5)
Eosinophils Relative: 2 %
HCT: 35.5 % — ABNORMAL LOW (ref 36.0–46.0)
Hemoglobin: 11.4 g/dL — ABNORMAL LOW (ref 12.0–15.0)
Immature Granulocytes: 0 %
Lymphocytes Relative: 18 %
Lymphs Abs: 1.2 10*3/uL (ref 0.7–4.0)
MCH: 28.1 pg (ref 26.0–34.0)
MCHC: 32.1 g/dL (ref 30.0–36.0)
MCV: 87.4 fL (ref 80.0–100.0)
Monocytes Absolute: 0.8 10*3/uL (ref 0.1–1.0)
Monocytes Relative: 11 %
Neutro Abs: 4.7 10*3/uL (ref 1.7–7.7)
Neutrophils Relative %: 69 %
Platelets: 302 10*3/uL (ref 150–400)
RBC: 4.06 MIL/uL (ref 3.87–5.11)
RDW: 13.4 % (ref 11.5–15.5)
WBC: 6.8 10*3/uL (ref 4.0–10.5)
nRBC: 0 % (ref 0.0–0.2)

## 2018-11-14 LAB — IRON AND TIBC
Iron: 56 ug/dL (ref 28–170)
Saturation Ratios: 14 % (ref 10.4–31.8)
TIBC: 398 ug/dL (ref 250–450)
UIBC: 342 ug/dL

## 2018-11-14 LAB — FERRITIN: Ferritin: 11 ng/mL (ref 11–307)

## 2018-11-14 LAB — VITAMIN B12: Vitamin B-12: 130 pg/mL — ABNORMAL LOW (ref 180–914)

## 2018-11-14 LAB — FOLATE: Folate: 8.5 ng/mL (ref >5.9)

## 2018-11-14 LAB — LACTATE DEHYDROGENASE: LDH: 108 U/L (ref 98–192)

## 2018-11-14 LAB — SEDIMENTATION RATE: Sed Rate: 73 mm/hr — ABNORMAL HIGH (ref 0–30)

## 2018-11-17 ENCOUNTER — Telehealth: Payer: Self-pay | Admitting: Internal Medicine

## 2018-11-17 NOTE — Telephone Encounter (Signed)
Spoke to patient regarding results of the blood work; send my chart message-recommend sublingual B12.  Follow-up as planned

## 2018-11-27 ENCOUNTER — Other Ambulatory Visit: Payer: Self-pay | Admitting: Internal Medicine

## 2018-11-27 DIAGNOSIS — Z1231 Encounter for screening mammogram for malignant neoplasm of breast: Secondary | ICD-10-CM

## 2019-01-03 ENCOUNTER — Ambulatory Visit
Admission: RE | Admit: 2019-01-03 | Discharge: 2019-01-03 | Disposition: A | Discharge status: Home / Self Care | Payer: Medicare Other | Source: Ambulatory Visit | Attending: Internal Medicine | Admitting: Internal Medicine

## 2019-01-03 DIAGNOSIS — Z1231 Encounter for screening mammogram for malignant neoplasm of breast: Secondary | ICD-10-CM | POA: Insufficient documentation

## 2019-02-18 ENCOUNTER — Ambulatory Visit: Payer: Medicare Other | Admitting: Internal Medicine

## 2019-02-18 ENCOUNTER — Other Ambulatory Visit: Payer: Medicare Other

## 2019-02-19 ENCOUNTER — Other Ambulatory Visit: Payer: Self-pay

## 2019-02-20 ENCOUNTER — Inpatient Hospital Stay: Payer: Medicare Other | Attending: Internal Medicine

## 2019-02-20 ENCOUNTER — Ambulatory Visit: Payer: Medicare Other | Admitting: Internal Medicine

## 2019-02-20 ENCOUNTER — Other Ambulatory Visit: Payer: Self-pay

## 2019-02-20 DIAGNOSIS — D472 Monoclonal gammopathy: Secondary | ICD-10-CM | POA: Diagnosis not present

## 2019-02-20 DIAGNOSIS — I10 Essential (primary) hypertension: Secondary | ICD-10-CM | POA: Diagnosis not present

## 2019-02-20 DIAGNOSIS — G629 Polyneuropathy, unspecified: Secondary | ICD-10-CM | POA: Insufficient documentation

## 2019-02-20 DIAGNOSIS — K219 Gastro-esophageal reflux disease without esophagitis: Secondary | ICD-10-CM | POA: Diagnosis not present

## 2019-02-20 DIAGNOSIS — E785 Hyperlipidemia, unspecified: Secondary | ICD-10-CM | POA: Insufficient documentation

## 2019-02-20 DIAGNOSIS — C880 Waldenstrom macroglobulinemia: Secondary | ICD-10-CM | POA: Diagnosis present

## 2019-02-20 DIAGNOSIS — Z853 Personal history of malignant neoplasm of breast: Secondary | ICD-10-CM | POA: Diagnosis not present

## 2019-02-20 DIAGNOSIS — D649 Anemia, unspecified: Secondary | ICD-10-CM | POA: Diagnosis not present

## 2019-02-20 DIAGNOSIS — R700 Elevated erythrocyte sedimentation rate: Secondary | ICD-10-CM | POA: Insufficient documentation

## 2019-02-20 DIAGNOSIS — R5383 Other fatigue: Secondary | ICD-10-CM | POA: Diagnosis not present

## 2019-02-20 DIAGNOSIS — Z923 Personal history of irradiation: Secondary | ICD-10-CM | POA: Diagnosis not present

## 2019-02-20 DIAGNOSIS — Z79899 Other long term (current) drug therapy: Secondary | ICD-10-CM | POA: Insufficient documentation

## 2019-02-20 DIAGNOSIS — M1990 Unspecified osteoarthritis, unspecified site: Secondary | ICD-10-CM | POA: Insufficient documentation

## 2019-02-20 DIAGNOSIS — Z9221 Personal history of antineoplastic chemotherapy: Secondary | ICD-10-CM | POA: Insufficient documentation

## 2019-02-20 DIAGNOSIS — M545 Low back pain: Secondary | ICD-10-CM | POA: Diagnosis not present

## 2019-02-20 LAB — CBC WITH DIFFERENTIAL/PLATELET
Abs Immature Granulocytes: 0.01 10*3/uL (ref 0.00–0.07)
Basophils Absolute: 0 10*3/uL (ref 0.0–0.1)
Basophils Relative: 1 %
Eosinophils Absolute: 0.1 10*3/uL (ref 0.0–0.5)
Eosinophils Relative: 2 %
HCT: 35.6 % — ABNORMAL LOW (ref 36.0–46.0)
Hemoglobin: 11.2 g/dL — ABNORMAL LOW (ref 12.0–15.0)
Immature Granulocytes: 0 %
Lymphocytes Relative: 18 %
Lymphs Abs: 1.2 10*3/uL (ref 0.7–4.0)
MCH: 28.1 pg (ref 26.0–34.0)
MCHC: 31.5 g/dL (ref 30.0–36.0)
MCV: 89.2 fL (ref 80.0–100.0)
Monocytes Absolute: 0.8 10*3/uL (ref 0.1–1.0)
Monocytes Relative: 12 %
Neutro Abs: 4.4 10*3/uL (ref 1.7–7.7)
Neutrophils Relative %: 67 %
Platelets: 294 10*3/uL (ref 150–400)
RBC: 3.99 MIL/uL (ref 3.87–5.11)
RDW: 13.5 % (ref 11.5–15.5)
WBC: 6.6 10*3/uL (ref 4.0–10.5)
nRBC: 0 % (ref 0.0–0.2)

## 2019-02-21 LAB — KAPPA/LAMBDA LIGHT CHAINS
Kappa free light chain: 7.6 mg/L (ref 3.3–19.4)
Kappa, lambda light chain ratio: 0.23 — ABNORMAL LOW (ref 0.26–1.65)
Lambda free light chains: 33.1 mg/L — ABNORMAL HIGH (ref 5.7–26.3)

## 2019-02-24 LAB — MULTIPLE MYELOMA PANEL, SERUM
Albumin SerPl Elph-Mcnc: 3.5 g/dL (ref 2.9–4.4)
Albumin/Glob SerPl: 0.9 (ref 0.7–1.7)
Alpha 1: 0.6 g/dL — ABNORMAL HIGH (ref 0.0–0.4)
Alpha2 Glob SerPl Elph-Mcnc: 1.1 g/dL — ABNORMAL HIGH (ref 0.4–1.0)
B-Globulin SerPl Elph-Mcnc: 1 g/dL (ref 0.7–1.3)
Gamma Glob SerPl Elph-Mcnc: 1.3 g/dL (ref 0.4–1.8)
Globulin, Total: 3.9 g/dL (ref 2.2–3.9)
IgA: 28 mg/dL — ABNORMAL LOW (ref 64–422)
IgG (Immunoglobin G), Serum: 333 mg/dL — ABNORMAL LOW (ref 586–1602)
IgM (Immunoglobulin M), Srm: 2267 mg/dL — ABNORMAL HIGH (ref 26–217)
M Protein SerPl Elph-Mcnc: 0.9 g/dL — ABNORMAL HIGH
Total Protein ELP: 7.4 g/dL (ref 6.0–8.5)

## 2019-02-27 ENCOUNTER — Other Ambulatory Visit: Payer: Self-pay

## 2019-02-27 ENCOUNTER — Inpatient Hospital Stay (HOSPITAL_BASED_OUTPATIENT_CLINIC_OR_DEPARTMENT_OTHER): Payer: Medicare Other | Admitting: Internal Medicine

## 2019-02-27 ENCOUNTER — Encounter: Payer: Self-pay | Admitting: Internal Medicine

## 2019-02-27 DIAGNOSIS — C880 Waldenstrom macroglobulinemia: Secondary | ICD-10-CM | POA: Diagnosis not present

## 2019-02-27 DIAGNOSIS — D472 Monoclonal gammopathy: Secondary | ICD-10-CM | POA: Diagnosis not present

## 2019-02-27 NOTE — Progress Notes (Signed)
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 degen changes; SEP 2018-M spike- 1.3 gm/dl  # 2016-Temporal A Bx-neg [headaches/diziness].   # Bil LE PN [Dr.Potter- on Neurontin]  # 1997-LEFT BREAST CA Stage III  ER-POS;s/p MASTEC;s/p chemo; s/p RT NSABP- 33; s/p Aromasin.   # ESR- Elevated ? From Monoclonal gammopathy ----------------------------------------------------   DIAGNOSIS: IgM- MGUS   CURRENT/MOST RECENT THERAPY: surveillaince    Macroglobulinemia of Waldenstrom ([place])     INTERVAL HISTORY:  85 year old female patient with above history of IgM-MGUS is here for follow-up./Reviewed the use of her labs.  Patient continues to have chronic tingling and numbness in extremities.  She is on Neurontin.  She continues to have fatigue; and she has retired from volunteering services.  She complains of intermittent right eye pain; currently resolved.  Patient complains of lower back pain with radiation over the last 2 to 3 weeks.  Patient has been taking NSAIDs without significant benefit.  Patient had a x-ray of the back with PCP; as per the patient normal.  Denies any bladder or bowel incontinence.  Review of Systems  Constitutional: Positive for malaise/fatigue. Negative for chills, diaphoresis, fever and weight loss.  HENT: Negative for nosebleeds and sore throat.   Eyes: Negative for double vision.  Respiratory: Positive for shortness of breath. Negative for cough, hemoptysis, sputum production and wheezing.   Cardiovascular: Negative for palpitations, orthopnea and leg swelling.   Gastrointestinal: Negative for abdominal pain, blood in stool, constipation, diarrhea, heartburn, melena, nausea and vomiting.  Genitourinary: Negative for dysuria, frequency and urgency.  Musculoskeletal: Positive for back pain and joint pain.  Skin: Negative.  Negative for itching and rash.  Neurological: Positive for tingling. Negative for dizziness, focal weakness, weakness and headaches.  Endo/Heme/Allergies: Does not bruise/bleed easily.  Psychiatric/Behavioral: Negative for depression. The patient is not nervous/anxious and does not have insomnia.    PAST MEDICAL HISTORY :  Past Medical History:  Diagnosis Date  . Anxiety   . Arthritis   . Breast cancer ([place]) 1997   left breast cancer  . Cancer ([place])   . Environmental and seasonal allergies   . GERD (gastroesophageal reflux disease)   . History of left breast cancer   . Hyperlipidemia   . Hypertension   . Left bundle branch block (LBBB)   . Neuropathy   . Personal history of chemotherapy 1997   Left breast  . Personal history of radiation therapy 1997   Left breast    PAST SURGICAL HISTORY :   Past Surgical History:  Procedure Laterality Date  . BACK SURGERY    . BREAST SURGERY    . CARDIAC CATHETERIZATION    . DILATION AND CURETTAGE OF UTERUS    . EYE SURGERY Bilateral    Cataract Extraction with IOL  . KNEE ARTHROPLASTY Right 09/14/2015   Procedure: COMPUTER ASSISTED TOTAL KNEE ARTHROPLASTY;  Surgeon: Dereck Leep, MD;  Location: [facility] ORS;  Service: Orthopedics;  Laterality: Right;  . KNEE ARTHROPLASTY Left 05/30/2016   Procedure: COMPUTER ASSISTED TOTAL KNEE ARTHROPLASTY;  Surgeon: Dereck Leep, MD;  Location: [facility] ORS;  Service: Orthopedics;  Laterality: Left;  .  KYPHOPLASTY  2016   Dr. Rudene Christians, [facility]  . MASTECTOMY Left 1997   chemo rad mastectomy  . TONSILLECTOMY      FAMILY HISTORY :   Family History  Problem Relation Age of Onset  . Breast cancer Neg Hx     SOCIAL HISTORY:   Social History    Tobacco Use  . Smoking status: Never Smoker  . Smokeless tobacco: Never Used  Substance Use Topics  . Alcohol use: Yes    Alcohol/week: 1.0 standard drinks    Types: 1 Glasses of wine per week    Comment: occ.  . Drug use: No    ALLERGIES:  is allergic to adhesive [tape] and ultram [tramadol].  MEDICATIONS:  Current Outpatient Medications  Medication Sig Dispense Refill  . acetaminophen (TYLENOL) 500 MG tablet Take 1,000 mg by mouth 2 (two) times daily as needed for mild pain or headache.     . carvedilol (COREG) 6.25 MG tablet Take 6.25 mg by mouth 2 (two) times daily with a meal.    . celecoxib (CELEBREX) 200 MG capsule Take 200 mg by mouth daily.    . cetirizine (ZYRTEC) 10 MG tablet Take 10 mg by mouth daily.    . Cholecalciferol (VITAMIN D3) 2000 units TABS Take 2,000 Units by mouth daily.    . fluticasone (FLONASE) 50 MCG/ACT nasal spray Place 2 sprays into both nostrils 2 (two) times daily as needed for rhinitis.    Marland Kitchen gabapentin (NEURONTIN) 100 MG capsule Take 200 mg by mouth 2 (two) times daily.    Marland Kitchen LOSARTAN POTASSIUM PO Take 100 mg by mouth daily.    Marland Kitchen omeprazole (PRILOSEC) 20 MG capsule Take 20 mg by mouth daily.    . sennosides-docusate sodium (SENOKOT-S) 8.6-50 MG tablet Take 4 tablets by mouth at bedtime.     . simvastatin (ZOCOR) 40 MG tablet Take 40 mg by mouth at bedtime.    . clobetasol ointment (TEMOVATE) 0.05 % APPLY TO AFFECTED AREA DAILY AS NEEDED for rash on buttocks  3  . Cyanocobalamin (VITAMIN B-12) 1000 MCG SUBL Place under the tongue.     No current facility-administered medications for this visit.     PHYSICAL EXAMINATION:  BP (!) 159/75 (BP Location: Right Leg, Patient Position: Sitting)   Pulse 81   Temp 97.6 F (36.4 C) (Tympanic)   Resp 18   Wt 157 lb (71.2 kg)   BMI 24.96 kg/m   Filed Weights   02/27/19 1409  Weight: 157 lb (71.2 kg)    Physical Exam  Constitutional: She is oriented to person, place, and time and well-developed,  well-nourished, and in no distress.  HENT:  Head: Normocephalic and atraumatic.  Mouth/Throat: Oropharynx is clear and moist. No oropharyngeal exudate.  Eyes: Pupils are equal, round, and reactive to light.  Neck: Normal range of motion. Neck supple.  Cardiovascular: Normal rate and regular rhythm.  Pulmonary/Chest: No respiratory distress. She has no wheezes.  Abdominal: Soft. Bowel sounds are normal. She exhibits no distension and no mass. There is no abdominal tenderness. There is no rebound and no guarding.  Musculoskeletal: Normal range of motion.        General: No tenderness or edema.  Neurological: She is alert and oriented to person, place, and time.  Skin: Skin is warm.  Psychiatric: Affect normal.    LABORATORY DATA:  I have reviewed the data as listed    Component Value Date/Time   NA 138 08/07/2018 1042   NA 132 (L)  02/02/2014 1947   K 3.9 08/07/2018 1042   K 4.3 02/02/2014 1947   CL 105 08/07/2018 1042   CL 100 02/02/2014 1947   CO2 26 08/07/2018 1042   CO2 21 02/02/2014 1947   GLUCOSE 100 (H) 08/07/2018 1042   GLUCOSE 107 (H) 02/02/2014 1947   BUN 13 08/07/2018 1042   BUN 22 (H) 02/02/2014 1947   CREATININE 0.78 08/07/2018 1042   CREATININE 0.94 02/02/2014 1947   CALCIUM 9.6 08/07/2018 1042   CALCIUM 10.2 12/08/2014 1540   PROT 7.4 08/07/2018 1042   PROT 7.7 10/21/2013 1106   ALBUMIN 3.6 08/07/2018 1042   ALBUMIN 3.3 (L) 10/21/2013 1106   AST 15 08/07/2018 1042   AST 16 10/21/2013 1106   ALT 10 08/07/2018 1042   ALT 25 10/21/2013 1106   ALKPHOS 60 08/07/2018 1042   ALKPHOS 83 10/21/2013 1106   BILITOT 0.8 08/07/2018 1042   BILITOT 0.5 10/21/2013 1106   GFRNONAA >60 08/07/2018 1042   GFRNONAA >60 02/02/2014 1947   GFRNONAA 57 (L) 10/21/2013 1106   GFRAA >60 08/07/2018 1042   GFRAA >60 02/02/2014 1947   GFRAA >60 10/21/2013 1106    No results found for: SPEP, UPEP  Lab Results  Component Value Date   WBC 6.6 02/20/2019   NEUTROABS 4.4  02/20/2019   HGB 11.2 (L) 02/20/2019   HCT 35.6 (L) 02/20/2019   MCV 89.2 02/20/2019   PLT 294 02/20/2019      Chemistry      Component Value Date/Time   NA 138 08/07/2018 1042   NA 132 (L) 02/02/2014 1947   K 3.9 08/07/2018 1042   K 4.3 02/02/2014 1947   CL 105 08/07/2018 1042   CL 100 02/02/2014 1947   CO2 26 08/07/2018 1042   CO2 21 02/02/2014 1947   BUN 13 08/07/2018 1042   BUN 22 (H) 02/02/2014 1947   CREATININE 0.78 08/07/2018 1042   CREATININE 0.94 02/02/2014 1947      Component Value Date/Time   CALCIUM 9.6 08/07/2018 1042   CALCIUM 10.2 12/08/2014 1540   ALKPHOS 60 08/07/2018 1042   ALKPHOS 83 10/21/2013 1106   AST 15 08/07/2018 1042   AST 16 10/21/2013 1106   ALT 10 08/07/2018 1042   ALT 25 10/21/2013 1106   BILITOT 0.8 08/07/2018 1042   BILITOT 0.5 10/21/2013 1106       RADIOGRAPHIC STUDIES: I have personally reviewed the radiological images as listed and agreed with the findings in the report. No results found.   ASSESSMENT & PLAN:  MGUS (monoclonal gammopathy of unknown significance) # MGUS-IgM lambda- M protein- Dec 2020- M protein- 0.9gm/dl; kappa lambda light chain ratio-stable.  Not suspecting progression of disease [see below-anemia/back pain]  # Mild intermittent anemia-11.2.  Stable.  Follow-up further work-up at this time.  # lower back Back pain- acute- PCP- X-rays- NEG; brace to support- if not improved- would recommend imaging like lumbar MRI /recommend follow up in Dr.Menz.  Continue NSAIDs as needed.  #Chronic neuropathy-nortryptline/ gabapentin-  STABLE.  [Dr.Potter];  #Chronically elevated ESR 80s to 90s; CRP normal.  Likely unrelated to MGUS;  stable  # History of breast cancer- ER/PR positive high risk/stage III. Clinically no evidence of recurrence.  July 2020 mammogram normal.  Stable  # DISPOSITION: # Follow up in 7 months-MD-labs- 1 week prior- cbc/bmp/ESR/ Multiple myeloma panel; kappa/lamda light chains- Dr.B  Cc;  Dr.Anderson.      Cammie Sickle, MD 02/27/2019 4:43 PM

## 2019-02-27 NOTE — Assessment & Plan Note (Addendum)
#  MGUS-IgM lambda- M protein- Dec 2020- M protein- 0.9gm/dl; kappa lambda light chain ratio-stable.  Not suspecting progression of disease [see below-anemia/back pain]  # Mild intermittent anemia-11.2.  Stable.  Follow-up further work-up at this time.  # lower back Back pain- acute- PCP- X-rays- NEG; brace to support- if not improved- would recommend imaging like lumbar MRI /recommend follow up in Dr.Menz.  Continue NSAIDs as needed.  #Chronic neuropathy-nortryptline/ gabapentin-  STABLE.  [Dr.Potter];  #Chronically elevated ESR 80s to 90s; CRP normal.  Likely unrelated to MGUS;  stable  # History of breast cancer- ER/PR positive high risk/stage III. Clinically no evidence of recurrence.  July 2020 mammogram normal.  Stable  # DISPOSITION: # Follow up in 7 months-MD-labs- 1 week prior- cbc/bmp/ESR/ Multiple myeloma panel; kappa/lamda light chains- Dr.B  Cc; Dr.Anderson.

## 2019-03-26 ENCOUNTER — Encounter: Payer: Self-pay | Admitting: Internal Medicine

## 2019-08-15 DIAGNOSIS — G500 Trigeminal neuralgia: Secondary | ICD-10-CM | POA: Insufficient documentation

## 2019-09-25 ENCOUNTER — Inpatient Hospital Stay: Payer: Medicare Other | Attending: Internal Medicine

## 2019-09-25 ENCOUNTER — Other Ambulatory Visit: Payer: Self-pay

## 2019-09-25 DIAGNOSIS — D472 Monoclonal gammopathy: Secondary | ICD-10-CM | POA: Diagnosis present

## 2019-09-25 LAB — BASIC METABOLIC PANEL
Anion gap: 9 (ref 5–15)
BUN: 11 mg/dL (ref 8–23)
CO2: 26 mmol/L (ref 22–32)
Calcium: 9.5 mg/dL (ref 8.9–10.3)
Chloride: 103 mmol/L (ref 98–111)
Creatinine, Ser: 0.69 mg/dL (ref 0.44–1.00)
GFR calc Af Amer: >60 mL/min (ref >60)
GFR calc non Af Amer: >60 mL/min (ref >60)
Glucose, Bld: 114 mg/dL — ABNORMAL HIGH (ref 70–99)
Potassium: 3.9 mmol/L (ref 3.5–5.1)
Sodium: 138 mmol/L (ref 135–145)

## 2019-09-25 LAB — CBC WITH DIFFERENTIAL/PLATELET
Abs Immature Granulocytes: 0.01 10*3/uL (ref 0.00–0.07)
Basophils Absolute: 0 10*3/uL (ref 0.0–0.1)
Basophils Relative: 1 %
Eosinophils Absolute: 0.2 10*3/uL (ref 0.0–0.5)
Eosinophils Relative: 3 %
HCT: 35 % — ABNORMAL LOW (ref 36.0–46.0)
Hemoglobin: 11.6 g/dL — ABNORMAL LOW (ref 12.0–15.0)
Immature Granulocytes: 0 %
Lymphocytes Relative: 20 %
Lymphs Abs: 1.2 10*3/uL (ref 0.7–4.0)
MCH: 28.6 pg (ref 26.0–34.0)
MCHC: 33.1 g/dL (ref 30.0–36.0)
MCV: 86.4 fL (ref 80.0–100.0)
Monocytes Absolute: 0.8 10*3/uL (ref 0.1–1.0)
Monocytes Relative: 13 %
Neutro Abs: 3.8 10*3/uL (ref 1.7–7.7)
Neutrophils Relative %: 63 %
Platelets: 258 10*3/uL (ref 150–400)
RBC: 4.05 MIL/uL (ref 3.87–5.11)
RDW: 13.3 % (ref 11.5–15.5)
WBC: 6 10*3/uL (ref 4.0–10.5)
nRBC: 0 % (ref 0.0–0.2)

## 2019-09-25 LAB — SEDIMENTATION RATE: Sed Rate: 75 mm/hr — ABNORMAL HIGH (ref 0–30)

## 2019-09-26 LAB — KAPPA/LAMBDA LIGHT CHAINS
Kappa free light chain: 8.4 mg/L (ref 3.3–19.4)
Kappa, lambda light chain ratio: 0.27 (ref 0.26–1.65)
Lambda free light chains: 31.5 mg/L — ABNORMAL HIGH (ref 5.7–26.3)

## 2019-09-27 LAB — MULTIPLE MYELOMA PANEL, SERUM
Albumin SerPl Elph-Mcnc: 3.5 g/dL (ref 2.9–4.4)
Albumin/Glob SerPl: 1 (ref 0.7–1.7)
Alpha 1: 0.4 g/dL (ref 0.0–0.4)
Alpha2 Glob SerPl Elph-Mcnc: 0.9 g/dL (ref 0.4–1.0)
B-Globulin SerPl Elph-Mcnc: 1 g/dL (ref 0.7–1.3)
Gamma Glob SerPl Elph-Mcnc: 1.5 g/dL (ref 0.4–1.8)
Globulin, Total: 3.8 g/dL (ref 2.2–3.9)
IgA: 30 mg/dL — ABNORMAL LOW (ref 64–422)
IgG (Immunoglobin G), Serum: 308 mg/dL — ABNORMAL LOW (ref 586–1602)
IgM (Immunoglobulin M), Srm: 2402 mg/dL — ABNORMAL HIGH (ref 26–217)
M Protein SerPl Elph-Mcnc: 0.9 g/dL — ABNORMAL HIGH
Total Protein ELP: 7.3 g/dL (ref 6.0–8.5)

## 2019-10-01 ENCOUNTER — Encounter: Payer: Self-pay | Admitting: Internal Medicine

## 2019-10-01 NOTE — Progress Notes (Signed)
Pt reports that she was seen by Dr. Ouida Sills recently due to SOB/cough, and had a echo done which was abnormal.

## 2019-10-02 ENCOUNTER — Other Ambulatory Visit: Payer: Self-pay

## 2019-10-02 ENCOUNTER — Inpatient Hospital Stay (HOSPITAL_BASED_OUTPATIENT_CLINIC_OR_DEPARTMENT_OTHER): Payer: Medicare Other | Admitting: Internal Medicine

## 2019-10-02 DIAGNOSIS — D472 Monoclonal gammopathy: Secondary | ICD-10-CM

## 2019-10-02 NOTE — Progress Notes (Signed)
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 degen changes; SEP 2018-M spike- 1.3 gm/dl  # 2016-Temporal A Bx-neg [headaches/diziness].   # Bil LE PN [Dr.Potter- on Neurontin]  # 1997-LEFT BREAST CA Stage III  ER-POS;s/p MASTEC;s/p chemo; s/p RT NSABP- 33; s/p Aromasin.   # ESR- Elevated ? From Monoclonal gammopathy ----------------------------------------------------   DIAGNOSIS: IgM- MGUS   CURRENT/MOST RECENT THERAPY: surveillaince    Macroglobulinemia of Waldenstrom ([place])     INTERVAL HISTORY:  86 year old female patient with above history of IgM-MGUS is here for follow-up.  Patient notes to have recent shortness of breath on exertion/fatigue-she is currently awaiting cardiology evaluation with 2D echo.  Chronic tingling and numbness in extremities on gabapentin.  Not any worse.   Review of Systems  Constitutional: Positive for malaise/fatigue. Negative for chills, diaphoresis, fever and weight loss.  HENT: Negative for nosebleeds and sore throat.   Eyes: Negative for double vision.  Respiratory: Positive for shortness of breath. Negative for cough, hemoptysis, sputum production and wheezing.   Cardiovascular: Negative for palpitations, orthopnea and leg swelling.  Gastrointestinal: Negative for abdominal pain, blood in stool, constipation, diarrhea, heartburn, melena, nausea and vomiting.  Genitourinary: Negative for dysuria, frequency and urgency.  Musculoskeletal: Positive for back pain and joint pain.  Skin:  Negative.  Negative for itching and rash.  Neurological: Positive for tingling. Negative for dizziness, focal weakness, weakness and headaches.  Endo/Heme/Allergies: Does not bruise/bleed easily.  Psychiatric/Behavioral: Negative for depression. The patient is not nervous/anxious and does not have insomnia.    PAST MEDICAL HISTORY :  Past Medical History:  Diagnosis Date  . Anxiety   . Arthritis   . Breast cancer ([place]) 1997   left breast cancer  . Cancer ([place])   . Environmental and seasonal allergies   . GERD (gastroesophageal reflux disease)   . History of left breast cancer   . Hyperlipidemia   . Hypertension   . Left bundle branch block (LBBB)   . Neuropathy   . Personal history of chemotherapy 1997   Left breast  . Personal history of radiation therapy 1997   Left breast    PAST SURGICAL HISTORY :   Past Surgical History:  Procedure Laterality Date  . BACK SURGERY    . BREAST SURGERY    . CARDIAC CATHETERIZATION    . DILATION AND CURETTAGE OF UTERUS    . EYE SURGERY Bilateral    Cataract Extraction with IOL  . KNEE ARTHROPLASTY Right 09/14/2015   Procedure: COMPUTER ASSISTED TOTAL KNEE ARTHROPLASTY;  Surgeon: Dereck Leep, MD;  Location: [facility] ORS;  Service: Orthopedics;  Laterality: Right;  . KNEE ARTHROPLASTY Left 05/30/2016   Procedure: COMPUTER ASSISTED TOTAL KNEE ARTHROPLASTY;  Surgeon: Dereck Leep, MD;  Location: [facility] ORS;  Service: Orthopedics;  Laterality: Left;  . KYPHOPLASTY  2016   Dr. Rudene Christians, [facility]  . MASTECTOMY Left 1997   chemo rad mastectomy  . TONSILLECTOMY      FAMILY HISTORY :   Family History  Problem Relation Age of Onset  . Breast cancer Neg Hx  SOCIAL HISTORY:   Social History   Tobacco Use  . Smoking status: Never Smoker  . Smokeless tobacco: Never Used  Substance Use Topics  . Alcohol use: Yes    Alcohol/week: 1.0 standard drink    Types: 1 Glasses of wine per week    Comment: occ.  . Drug use: No    ALLERGIES:  is  allergic to adhesive [tape] and ultram [tramadol].  MEDICATIONS:  Current Outpatient Medications  Medication Sig Dispense Refill  . acetaminophen (TYLENOL) 500 MG tablet Take 1,000 mg by mouth 2 (two) times daily as needed for mild pain or headache.     . carvedilol (COREG) 12.5 MG tablet Take 12.5 mg by mouth 2 (two) times daily with a meal.    . celecoxib (CELEBREX) 200 MG capsule Take 200 mg by mouth daily.    . cetirizine (ZYRTEC) 10 MG tablet Take 10 mg by mouth daily.    . Cholecalciferol (VITAMIN D3) 2000 units TABS Take 2,000 Units by mouth daily.    . clobetasol ointment (TEMOVATE) 0.05 % APPLY TO AFFECTED AREA DAILY AS NEEDED for rash on buttocks  3  . fluticasone (FLONASE) 50 MCG/ACT nasal spray Place 2 sprays into both nostrils 2 (two) times daily as needed for rhinitis.    Marland Kitchen gabapentin (NEURONTIN) 300 MG capsule Take 300 mg by mouth 3 (three) times daily.    Marland Kitchen LOSARTAN POTASSIUM PO Take 100 mg by mouth daily.     Marland Kitchen omeprazole (PRILOSEC) 20 MG capsule Take 20 mg by mouth daily.    . sennosides-docusate sodium (SENOKOT-S) 8.6-50 MG tablet Take 4 tablets by mouth at bedtime.     . simvastatin (ZOCOR) 40 MG tablet Take 40 mg by mouth at bedtime.    . Cyanocobalamin (VITAMIN B-12) 1000 MCG SUBL Place under the tongue. (Patient not taking: Reported on 10/02/2019)     No current facility-administered medications for this visit.    PHYSICAL EXAMINATION:  BP (!) 153/74   Pulse 69   Temp 98.6 F (37 C) (Tympanic)   Resp 18   Wt 159 lb (72.1 kg)   SpO2 99%   BMI 25.28 kg/m   Filed Weights   10/02/19 1340  Weight: 159 lb (72.1 kg)    Physical Exam HENT:     Head: Normocephalic and atraumatic.     Mouth/Throat:     Pharynx: No oropharyngeal exudate.  Eyes:     Pupils: Pupils are equal, round, and reactive to light.  Cardiovascular:     Rate and Rhythm: Normal rate and regular rhythm.  Pulmonary:     Effort: No respiratory distress.     Breath sounds: No wheezing.   Abdominal:     General: Bowel sounds are normal. There is no distension.     Palpations: Abdomen is soft. There is no mass.     Tenderness: There is no abdominal tenderness. There is no guarding or rebound.  Musculoskeletal:        General: No tenderness. Normal range of motion.     Cervical back: Normal range of motion and neck supple.  Skin:    General: Skin is warm.  Neurological:     Mental Status: She is alert and oriented to person, place, and time.  Psychiatric:        Mood and Affect: Affect normal.     LABORATORY DATA:  I have reviewed the data as listed    Component Value Date/Time   NA 138 09/25/2019 1041  NA 132 (L) 02/02/2014 1947   K 3.9 09/25/2019 1041   K 4.3 02/02/2014 1947   CL 103 09/25/2019 1041   CL 100 02/02/2014 1947   CO2 26 09/25/2019 1041   CO2 21 02/02/2014 1947   GLUCOSE 114 (H) 09/25/2019 1041   GLUCOSE 107 (H) 02/02/2014 1947   BUN 11 09/25/2019 1041   BUN 22 (H) 02/02/2014 1947   CREATININE 0.69 09/25/2019 1041   CREATININE 0.94 02/02/2014 1947   CALCIUM 9.5 09/25/2019 1041   CALCIUM 10.2 12/08/2014 1540   PROT 7.4 08/07/2018 1042   PROT 7.7 10/21/2013 1106   ALBUMIN 3.6 08/07/2018 1042   ALBUMIN 3.3 (L) 10/21/2013 1106   AST 15 08/07/2018 1042   AST 16 10/21/2013 1106   ALT 10 08/07/2018 1042   ALT 25 10/21/2013 1106   ALKPHOS 60 08/07/2018 1042   ALKPHOS 83 10/21/2013 1106   BILITOT 0.8 08/07/2018 1042   BILITOT 0.5 10/21/2013 1106   GFRNONAA >60 09/25/2019 1041   GFRNONAA >60 02/02/2014 1947   GFRNONAA 57 (L) 10/21/2013 1106   GFRAA >60 09/25/2019 1041   GFRAA >60 02/02/2014 1947   GFRAA >60 10/21/2013 1106    No results found for: SPEP, UPEP  Lab Results  Component Value Date   WBC 6.0 09/25/2019   NEUTROABS 3.8 09/25/2019   HGB 11.6 (L) 09/25/2019   HCT 35.0 (L) 09/25/2019   MCV 86.4 09/25/2019   PLT 258 09/25/2019      Chemistry      Component Value Date/Time   NA 138 09/25/2019 1041   NA 132 (L)  02/02/2014 1947   K 3.9 09/25/2019 1041   K 4.3 02/02/2014 1947   CL 103 09/25/2019 1041   CL 100 02/02/2014 1947   CO2 26 09/25/2019 1041   CO2 21 02/02/2014 1947   BUN 11 09/25/2019 1041   BUN 22 (H) 02/02/2014 1947   CREATININE 0.69 09/25/2019 1041   CREATININE 0.94 02/02/2014 1947      Component Value Date/Time   CALCIUM 9.5 09/25/2019 1041   CALCIUM 10.2 12/08/2014 1540   ALKPHOS 60 08/07/2018 1042   ALKPHOS 83 10/21/2013 1106   AST 15 08/07/2018 1042   AST 16 10/21/2013 1106   ALT 10 08/07/2018 1042   ALT 25 10/21/2013 1106   BILITOT 0.8 08/07/2018 1042   BILITOT 0.5 10/21/2013 1106       RADIOGRAPHIC STUDIES: I have personally reviewed the radiological images as listed and agreed with the findings in the report. No results found.   ASSESSMENT & PLAN:  MGUS (monoclonal gammopathy of unknown significance) # MGUS-IgM lambda- M protein- July 2021- M protein- 0.9gm/dl; kappa lambda light chain ratio-STABLE. Not suspecting progression of disease [see below-anemia/back pain]  # Mild intermittent anemia-11.6; over all STABLE;  Monitor for now.  # dyspnea on exertion- EF ~40%; awaiting eval with cards, Dr.Kowalski  #Chronic neuropathy-nortryptline/ gabapentin-  STABLE  [Dr.Potter];  #Chronically elevated ESR  ~75; CRP normal.  Likely unrelated to MGUS; STABLE.  # History of breast cancer- ER/PR positive high risk/stage III. Clinically no evidence of recurrence.oct 2020 mammogram [PCP] normal.  STABLE.   # DISPOSITION: # Follow up in 6 months-MD-labs- 1 week prior- cbc/bmp/ESR/ Multiple myeloma panel; kappa/lamda light chains- Dr.B  Cc; Dr.Anderson.      Cammie Sickle, MD 10/21/2019 8:31 AM

## 2019-10-02 NOTE — Progress Notes (Signed)
Patient states she has been diagnosed with enlarged heart and decreased ejection fraction

## 2019-10-02 NOTE — Assessment & Plan Note (Addendum)
#  MGUS-IgM lambda- M protein- July 2021- M protein- 0.9gm/dl; kappa lambda light chain ratio-STABLE. Not suspecting progression of disease [see below-anemia/back pain]  # Mild intermittent anemia-11.6; over all STABLE;  Monitor for now.  # dyspnea on exertion- EF ~40%; awaiting eval with cards, Dr.Kowalski  #Chronic neuropathy-nortryptline/ gabapentin-  STABLE  [Dr.Potter];  #Chronically elevated ESR  ~75; CRP normal.  Likely unrelated to MGUS; STABLE.  # History of breast cancer- ER/PR positive high risk/stage III. Clinically no evidence of recurrence.oct 2020 mammogram [PCP] normal.  STABLE.   # DISPOSITION: # Follow up in 6 months-MD-labs- 1 week prior- cbc/bmp/ESR/ Multiple myeloma panel; kappa/lamda light chains- Dr.B  Cc; Dr.Anderson.

## 2020-01-08 ENCOUNTER — Other Ambulatory Visit: Payer: Self-pay | Admitting: Internal Medicine

## 2020-01-08 DIAGNOSIS — Z1231 Encounter for screening mammogram for malignant neoplasm of breast: Secondary | ICD-10-CM

## 2020-02-17 ENCOUNTER — Ambulatory Visit
Admission: RE | Admit: 2020-02-17 | Discharge: 2020-02-17 | Disposition: A | Discharge status: Home / Self Care | Payer: Medicare Other | Source: Ambulatory Visit | Attending: Internal Medicine | Admitting: Internal Medicine

## 2020-02-17 ENCOUNTER — Other Ambulatory Visit: Payer: Self-pay

## 2020-02-17 DIAGNOSIS — Z1231 Encounter for screening mammogram for malignant neoplasm of breast: Secondary | ICD-10-CM

## 2020-02-24 ENCOUNTER — Other Ambulatory Visit: Payer: Self-pay | Admitting: Internal Medicine

## 2020-02-24 DIAGNOSIS — R928 Other abnormal and inconclusive findings on diagnostic imaging of breast: Secondary | ICD-10-CM

## 2020-02-24 DIAGNOSIS — N6310 Unspecified lump in the right breast, unspecified quadrant: Secondary | ICD-10-CM

## 2020-02-27 ENCOUNTER — Other Ambulatory Visit: Payer: Self-pay

## 2020-02-27 ENCOUNTER — Ambulatory Visit
Admission: RE | Admit: 2020-02-27 | Discharge: 2020-02-27 | Disposition: A | Discharge status: Home / Self Care | Payer: Medicare Other | Source: Ambulatory Visit | Attending: Internal Medicine | Admitting: Internal Medicine

## 2020-02-27 DIAGNOSIS — R928 Other abnormal and inconclusive findings on diagnostic imaging of breast: Secondary | ICD-10-CM | POA: Insufficient documentation

## 2020-02-27 DIAGNOSIS — N6310 Unspecified lump in the right breast, unspecified quadrant: Secondary | ICD-10-CM | POA: Diagnosis present

## 2020-03-25 ENCOUNTER — Other Ambulatory Visit: Payer: Medicare Other

## 2020-03-26 ENCOUNTER — Inpatient Hospital Stay: Payer: Medicare Other | Attending: Internal Medicine

## 2020-03-26 ENCOUNTER — Other Ambulatory Visit: Payer: Self-pay

## 2020-03-26 DIAGNOSIS — Z9221 Personal history of antineoplastic chemotherapy: Secondary | ICD-10-CM | POA: Insufficient documentation

## 2020-03-26 DIAGNOSIS — M129 Arthropathy, unspecified: Secondary | ICD-10-CM | POA: Insufficient documentation

## 2020-03-26 DIAGNOSIS — D472 Monoclonal gammopathy: Secondary | ICD-10-CM | POA: Insufficient documentation

## 2020-03-26 DIAGNOSIS — K219 Gastro-esophageal reflux disease without esophagitis: Secondary | ICD-10-CM | POA: Diagnosis not present

## 2020-03-26 DIAGNOSIS — I10 Essential (primary) hypertension: Secondary | ICD-10-CM | POA: Insufficient documentation

## 2020-03-26 DIAGNOSIS — D649 Anemia, unspecified: Secondary | ICD-10-CM | POA: Diagnosis not present

## 2020-03-26 DIAGNOSIS — F419 Anxiety disorder, unspecified: Secondary | ICD-10-CM | POA: Diagnosis not present

## 2020-03-26 DIAGNOSIS — E785 Hyperlipidemia, unspecified: Secondary | ICD-10-CM | POA: Diagnosis not present

## 2020-03-26 DIAGNOSIS — R700 Elevated erythrocyte sedimentation rate: Secondary | ICD-10-CM | POA: Diagnosis not present

## 2020-03-26 DIAGNOSIS — Z79899 Other long term (current) drug therapy: Secondary | ICD-10-CM | POA: Diagnosis not present

## 2020-03-26 DIAGNOSIS — G629 Polyneuropathy, unspecified: Secondary | ICD-10-CM | POA: Insufficient documentation

## 2020-03-26 DIAGNOSIS — C880 Waldenstrom macroglobulinemia: Secondary | ICD-10-CM | POA: Diagnosis present

## 2020-03-26 DIAGNOSIS — Z923 Personal history of irradiation: Secondary | ICD-10-CM | POA: Insufficient documentation

## 2020-03-26 DIAGNOSIS — Z853 Personal history of malignant neoplasm of breast: Secondary | ICD-10-CM | POA: Diagnosis not present

## 2020-03-26 LAB — BASIC METABOLIC PANEL
Anion gap: 8 (ref 5–15)
BUN: 17 mg/dL (ref 8–23)
CO2: 27 mmol/L (ref 22–32)
Calcium: 10.3 mg/dL (ref 8.9–10.3)
Chloride: 98 mmol/L (ref 98–111)
Creatinine, Ser: 0.87 mg/dL (ref 0.44–1.00)
GFR, Estimated: >60 mL/min (ref >60)
Glucose, Bld: 110 mg/dL — ABNORMAL HIGH (ref 70–99)
Potassium: 4.7 mmol/L (ref 3.5–5.1)
Sodium: 133 mmol/L — ABNORMAL LOW (ref 135–145)

## 2020-03-26 LAB — CBC WITH DIFFERENTIAL/PLATELET
Abs Immature Granulocytes: 0.01 10*3/uL (ref 0.00–0.07)
Basophils Absolute: 0.1 10*3/uL (ref 0.0–0.1)
Basophils Relative: 1 %
Eosinophils Absolute: 0.3 10*3/uL (ref 0.0–0.5)
Eosinophils Relative: 4 %
HCT: 36.1 % (ref 36.0–46.0)
Hemoglobin: 12.2 g/dL (ref 12.0–15.0)
Immature Granulocytes: 0 %
Lymphocytes Relative: 19 %
Lymphs Abs: 1.3 10*3/uL (ref 0.7–4.0)
MCH: 29.7 pg (ref 26.0–34.0)
MCHC: 33.8 g/dL (ref 30.0–36.0)
MCV: 87.8 fL (ref 80.0–100.0)
Monocytes Absolute: 0.9 10*3/uL (ref 0.1–1.0)
Monocytes Relative: 12 %
Neutro Abs: 4.4 10*3/uL (ref 1.7–7.7)
Neutrophils Relative %: 64 %
Platelets: 300 10*3/uL (ref 150–400)
RBC: 4.11 MIL/uL (ref 3.87–5.11)
RDW: 12.9 % (ref 11.5–15.5)
WBC: 6.8 10*3/uL (ref 4.0–10.5)
nRBC: 0 % (ref 0.0–0.2)

## 2020-03-26 LAB — SEDIMENTATION RATE: Sed Rate: 75 mm/hr — ABNORMAL HIGH (ref 0–22)

## 2020-03-27 LAB — KAPPA/LAMBDA LIGHT CHAINS
Kappa free light chain: 9 mg/L (ref 3.3–19.4)
Kappa, lambda light chain ratio: 0.3 (ref 0.26–1.65)
Lambda free light chains: 29.9 mg/L — ABNORMAL HIGH (ref 5.7–26.3)

## 2020-03-31 LAB — MULTIPLE MYELOMA PANEL, SERUM
Albumin SerPl Elph-Mcnc: 3.3 g/dL (ref 2.9–4.4)
Albumin/Glob SerPl: 0.8 (ref 0.7–1.7)
Alpha 1: 0.3 g/dL (ref 0.0–0.4)
Alpha2 Glob SerPl Elph-Mcnc: 0.9 g/dL (ref 0.4–1.0)
B-Globulin SerPl Elph-Mcnc: 2.1 g/dL — ABNORMAL HIGH (ref 0.7–1.3)
Gamma Glob SerPl Elph-Mcnc: 1.2 g/dL (ref 0.4–1.8)
Globulin, Total: 4.5 g/dL — ABNORMAL HIGH (ref 2.2–3.9)
IgA: 32 mg/dL — ABNORMAL LOW (ref 64–422)
IgG (Immunoglobin G), Serum: 369 mg/dL — ABNORMAL LOW (ref 586–1602)
IgM (Immunoglobulin M), Srm: 2539 mg/dL — ABNORMAL HIGH (ref 26–217)
M Protein SerPl Elph-Mcnc: 1.2 g/dL — ABNORMAL HIGH
Total Protein ELP: 7.8 g/dL (ref 6.0–8.5)

## 2020-04-01 ENCOUNTER — Ambulatory Visit: Payer: Medicare Other | Admitting: Internal Medicine

## 2020-04-02 ENCOUNTER — Inpatient Hospital Stay: Payer: Medicare Other | Admitting: Internal Medicine

## 2020-04-02 ENCOUNTER — Other Ambulatory Visit: Payer: Self-pay

## 2020-04-02 DIAGNOSIS — C880 Waldenstrom macroglobulinemia: Secondary | ICD-10-CM | POA: Diagnosis not present

## 2020-04-02 DIAGNOSIS — D472 Monoclonal gammopathy: Secondary | ICD-10-CM | POA: Diagnosis not present

## 2020-04-02 NOTE — Progress Notes (Signed)
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 degen changes; SEP 2018-M spike- 1.3 gm/dl  # 2016-Temporal A Bx-neg [headaches/diziness].   # Bil LE PN [Dr.Potter- on Neurontin]  # 1997-LEFT BREAST CA Stage III  ER-POS;s/p MASTEC;s/p chemo; s/p RT NSABP- 33; s/p Aromasin.   # ESR- Elevated ? From Monoclonal gammopathy ----------------------------------------------------   DIAGNOSIS: IgM- MGUS   CURRENT/MOST RECENT THERAPY: surveillaince    Macroglobulinemia of Waldenstrom ([place])   INTERVAL HISTORY:  87 year old female patient with above history of IgM-MGUS is here for follow-up.  Patient is quite excited about her grandson's recent wedding.  Otherwise denies any new onset of worsening shortness of breath or cough.  Denies any chest pain.  Denies any nausea vomiting abdominal pain.   Review of Systems  Constitutional: Positive for malaise/fatigue. Negative for chills, diaphoresis, fever and weight loss.  HENT: Negative for nosebleeds and sore throat.   Eyes: Negative for double vision.  Respiratory: Positive for shortness of breath. Negative for cough, hemoptysis, sputum production and wheezing.   Cardiovascular: Negative for palpitations, orthopnea and leg swelling.  Gastrointestinal: Negative for abdominal pain, blood in stool, constipation, diarrhea, heartburn, melena, nausea and vomiting.  Genitourinary: Negative for dysuria, frequency and urgency.  Musculoskeletal: Positive for back pain and joint pain.  Skin:  Negative.  Negative for itching and rash.  Neurological: Positive for tingling. Negative for dizziness, focal weakness, weakness and headaches.  Endo/Heme/Allergies: Does not bruise/bleed easily.  Psychiatric/Behavioral: Negative for depression. The patient is not nervous/anxious and does not have insomnia.    PAST MEDICAL HISTORY :  Past Medical History:  Diagnosis Date  . Anxiety   . Arthritis   . Breast cancer ([place]) 1997   left breast cancer  . Cancer ([place])   . Environmental and seasonal allergies   . GERD (gastroesophageal reflux disease)   . History of left breast cancer   . Hyperlipidemia   . Hypertension   . Left bundle branch block (LBBB)   . Neuropathy   . Personal history of chemotherapy 1997   Left breast  . Personal history of radiation therapy 1997   Left breast    PAST SURGICAL HISTORY :   Past Surgical History:  Procedure Laterality Date  . BACK SURGERY    . BREAST SURGERY    . CARDIAC CATHETERIZATION    . DILATION AND CURETTAGE OF UTERUS    . EYE SURGERY Bilateral    Cataract Extraction with IOL  . KNEE ARTHROPLASTY Right 09/14/2015   Procedure: COMPUTER ASSISTED TOTAL KNEE ARTHROPLASTY;  Surgeon: Dereck Leep, MD;  Location: [facility] ORS;  Service: Orthopedics;  Laterality: Right;  . KNEE ARTHROPLASTY Left 05/30/2016   Procedure: COMPUTER ASSISTED TOTAL KNEE ARTHROPLASTY;  Surgeon: Dereck Leep, MD;  Location: [facility] ORS;  Service: Orthopedics;  Laterality: Left;  . KYPHOPLASTY  2016   Dr. Rudene Christians, [facility]  . MASTECTOMY Left 1997   chemo rad mastectomy  . TONSILLECTOMY      FAMILY HISTORY :   Family History  Problem Relation Age of Onset  . Breast cancer Neg Hx  SOCIAL HISTORY:   Social History   Tobacco Use  . Smoking status: Never Smoker  . Smokeless tobacco: Never Used  Substance Use Topics  . Alcohol use: Yes    Alcohol/week: 1.0 standard drink    Types: 1 Glasses of wine per week    Comment: occ.  . Drug use: No    ALLERGIES:  is  allergic to adhesive [tape] and ultram [tramadol].  MEDICATIONS:  Current Outpatient Medications  Medication Sig Dispense Refill  . acetaminophen (TYLENOL) 500 MG tablet Take 1,000 mg by mouth 2 (two) times daily as needed for mild pain or headache.     . carvedilol (COREG) 12.5 MG tablet Take 12.5 mg by mouth 2 (two) times daily with a meal.    . celecoxib (CELEBREX) 200 MG capsule Take 200 mg by mouth daily.    . cetirizine (ZYRTEC) 10 MG tablet Take 10 mg by mouth daily.    . Cholecalciferol (VITAMIN D3) 2000 units TABS Take 2,000 Units by mouth daily.    . clobetasol ointment (TEMOVATE) 0.05 % APPLY TO AFFECTED AREA DAILY AS NEEDED for rash on buttocks  3  . Cyanocobalamin (VITAMIN B-12) 1000 MCG SUBL Place under the tongue.    . fluticasone (FLONASE) 50 MCG/ACT nasal spray Place 2 sprays into both nostrils 2 (two) times daily as needed for rhinitis.    Marland Kitchen gabapentin (NEURONTIN) 300 MG capsule Take 300 mg by mouth 3 (three) times daily.    Marland Kitchen losartan (COZAAR) 50 MG tablet Take 1 tablet by mouth daily.    Marland Kitchen omeprazole (PRILOSEC) 20 MG capsule Take 20 mg by mouth daily.    . sennosides-docusate sodium (SENOKOT-S) 8.6-50 MG tablet Take 4 tablets by mouth at bedtime.     . simvastatin (ZOCOR) 40 MG tablet Take 40 mg by mouth at bedtime.    Marland Kitchen spironolactone (ALDACTONE) 25 MG tablet Take 1 tablet by mouth daily.     No current facility-administered medications for this visit.    PHYSICAL EXAMINATION:  BP 125/77   Pulse 72   Temp 97.7 F (36.5 C) (Oral)   Resp 20   Ht 5' 5"  (1.651 m)   Wt 156 lb (70.8 kg)   BMI 25.96 kg/m   Filed Weights   04/02/20 1033  Weight: 156 lb (70.8 kg)    Physical Exam HENT:     Head: Normocephalic and atraumatic.     Mouth/Throat:     Pharynx: No oropharyngeal exudate.  Eyes:     Pupils: Pupils are equal, round, and reactive to light.  Cardiovascular:     Rate and Rhythm: Normal rate and regular rhythm.  Pulmonary:     Effort: No respiratory  distress.     Breath sounds: No wheezing.  Abdominal:     General: Bowel sounds are normal. There is no distension.     Palpations: Abdomen is soft. There is no mass.     Tenderness: There is no abdominal tenderness. There is no guarding or rebound.  Musculoskeletal:        General: No tenderness. Normal range of motion.     Cervical back: Normal range of motion and neck supple.  Skin:    General: Skin is warm.  Neurological:     Mental Status: She is alert and oriented to person, place, and time.  Psychiatric:        Mood and Affect: Affect normal.     LABORATORY DATA:  I have reviewed the data as listed  Component Value Date/Time   NA 133 (L) 03/26/2020 0858   NA 132 (L) 02/02/2014 1947   K 4.7 03/26/2020 0858   K 4.3 02/02/2014 1947   CL 98 03/26/2020 0858   CL 100 02/02/2014 1947   CO2 27 03/26/2020 0858   CO2 21 02/02/2014 1947   GLUCOSE 110 (H) 03/26/2020 0858   GLUCOSE 107 (H) 02/02/2014 1947   BUN 17 03/26/2020 0858   BUN 22 (H) 02/02/2014 1947   CREATININE 0.87 03/26/2020 0858   CREATININE 0.94 02/02/2014 1947   CALCIUM 10.3 03/26/2020 0858   CALCIUM 10.2 12/08/2014 1540   PROT 7.4 08/07/2018 1042   PROT 7.7 10/21/2013 1106   ALBUMIN 3.6 08/07/2018 1042   ALBUMIN 3.3 (L) 10/21/2013 1106   AST 15 08/07/2018 1042   AST 16 10/21/2013 1106   ALT 10 08/07/2018 1042   ALT 25 10/21/2013 1106   ALKPHOS 60 08/07/2018 1042   ALKPHOS 83 10/21/2013 1106   BILITOT 0.8 08/07/2018 1042   BILITOT 0.5 10/21/2013 1106   GFRNONAA >60 03/26/2020 0858   GFRNONAA >60 02/02/2014 1947   GFRNONAA 57 (L) 10/21/2013 1106   GFRAA >60 09/25/2019 1041   GFRAA >60 02/02/2014 1947   GFRAA >60 10/21/2013 1106    No results found for: SPEP, UPEP  Lab Results  Component Value Date   WBC 6.8 03/26/2020   NEUTROABS 4.4 03/26/2020   HGB 12.2 03/26/2020   HCT 36.1 03/26/2020   MCV 87.8 03/26/2020   PLT 300 03/26/2020      Chemistry      Component Value Date/Time   NA 133  (L) 03/26/2020 0858   NA 132 (L) 02/02/2014 1947   K 4.7 03/26/2020 0858   K 4.3 02/02/2014 1947   CL 98 03/26/2020 0858   CL 100 02/02/2014 1947   CO2 27 03/26/2020 0858   CO2 21 02/02/2014 1947   BUN 17 03/26/2020 0858   BUN 22 (H) 02/02/2014 1947   CREATININE 0.87 03/26/2020 0858   CREATININE 0.94 02/02/2014 1947      Component Value Date/Time   CALCIUM 10.3 03/26/2020 0858   CALCIUM 10.2 12/08/2014 1540   ALKPHOS 60 08/07/2018 1042   ALKPHOS 83 10/21/2013 1106   AST 15 08/07/2018 1042   AST 16 10/21/2013 1106   ALT 10 08/07/2018 1042   ALT 25 10/21/2013 1106   BILITOT 0.8 08/07/2018 1042   BILITOT 0.5 10/21/2013 1106       RADIOGRAPHIC STUDIES: I have personally reviewed the radiological images as listed and agreed with the findings in the report. No results found.   ASSESSMENT & PLAN:  MGUS (monoclonal gammopathy of unknown significance) # MGUS-IgM lambda- M protein- JAN 2022-- M protein- 1.3 gm/dl; kappa lambda light chain ratio-overall stable.  Not suspecting progression of disease.   # Mild intermittent anemia-12.2- STABLE;  #Chronic neuropathy-nortryptline/ gabapentin- STABLE;  [Dr.Potter];  #Chronically elevated ESR 80s to 90s; CRP normal. ? Related MGUS-STABLE.   # History of breast cancer- ER/PR positive high risk/stage III. Clinically no evidence of recurrence.  DEC 2021-diagnostic mammogram normal;STABLE.   # DISPOSITION: # Follow up in 7 months-MD-labs- 1 week prior- cbc/bmp/ESR/ Multiple myeloma panel; kappa/lamda light chains- Dr.B  Cc; Dr.Anderson.      Cammie Sickle, MD 04/02/2020 1:05 PM

## 2020-04-02 NOTE — Assessment & Plan Note (Addendum)
#  MGUS-IgM lambda- M protein- JAN 2022-- M protein- 1.3 gm/dl; kappa lambda light chain ratio-overall stable.  Not suspecting progression of disease.   # Mild intermittent anemia-12.2- STABLE;  #Chronic neuropathy-nortryptline/ gabapentin- STABLE;  [Dr.Potter];  #Chronically elevated ESR 80s to 90s; CRP normal. ? Related MGUS-STABLE.   # History of breast cancer- ER/PR positive high risk/stage III. Clinically no evidence of recurrence.  DEC 2021-diagnostic mammogram normal;STABLE.   # DISPOSITION: # Follow up in 7 months-MD-labs- 1 week prior- cbc/bmp/ESR/ Multiple myeloma panel; kappa/lamda light chains- Dr.B  Cc; Dr.Anderson.

## 2020-05-09 ENCOUNTER — Other Ambulatory Visit: Payer: Self-pay

## 2020-05-09 ENCOUNTER — Emergency Department: Payer: Medicare Other

## 2020-05-09 ENCOUNTER — Emergency Department
Admission: EM | Admit: 2020-05-09 | Discharge: 2020-05-09 | Disposition: A | Discharge status: Home / Self Care | Payer: Medicare Other | Attending: Emergency Medicine | Admitting: Emergency Medicine

## 2020-05-09 ENCOUNTER — Encounter: Payer: Self-pay | Admitting: Radiology

## 2020-05-09 DIAGNOSIS — Z853 Personal history of malignant neoplasm of breast: Secondary | ICD-10-CM | POA: Insufficient documentation

## 2020-05-09 DIAGNOSIS — R55 Syncope and collapse: Secondary | ICD-10-CM | POA: Insufficient documentation

## 2020-05-09 DIAGNOSIS — I10 Essential (primary) hypertension: Secondary | ICD-10-CM | POA: Diagnosis not present

## 2020-05-09 DIAGNOSIS — R110 Nausea: Secondary | ICD-10-CM | POA: Diagnosis present

## 2020-05-09 DIAGNOSIS — Z79899 Other long term (current) drug therapy: Secondary | ICD-10-CM | POA: Insufficient documentation

## 2020-05-09 DIAGNOSIS — Z96653 Presence of artificial knee joint, bilateral: Secondary | ICD-10-CM | POA: Diagnosis not present

## 2020-05-09 LAB — CBC WITH DIFFERENTIAL/PLATELET
Abs Immature Granulocytes: 0.03 10*3/uL (ref 0.00–0.07)
Basophils Absolute: 0.1 10*3/uL (ref 0.0–0.1)
Basophils Relative: 1 %
Eosinophils Absolute: 0.2 10*3/uL (ref 0.0–0.5)
Eosinophils Relative: 2 %
HCT: 37.1 % (ref 36.0–46.0)
Hemoglobin: 12.5 g/dL (ref 12.0–15.0)
Immature Granulocytes: 0 %
Lymphocytes Relative: 14 %
Lymphs Abs: 1.1 10*3/uL (ref 0.7–4.0)
MCH: 29.6 pg (ref 26.0–34.0)
MCHC: 33.7 g/dL (ref 30.0–36.0)
MCV: 87.9 fL (ref 80.0–100.0)
Monocytes Absolute: 0.9 10*3/uL (ref 0.1–1.0)
Monocytes Relative: 11 %
Neutro Abs: 5.6 10*3/uL (ref 1.7–7.7)
Neutrophils Relative %: 72 %
Platelets: 298 10*3/uL (ref 150–400)
RBC: 4.22 MIL/uL (ref 3.87–5.11)
RDW: 13 % (ref 11.5–15.5)
WBC: 7.8 10*3/uL (ref 4.0–10.5)
nRBC: 0 % (ref 0.0–0.2)

## 2020-05-09 LAB — TROPONIN I (HIGH SENSITIVITY)
Troponin I (High Sensitivity): 3 ng/L (ref <18)
Troponin I (High Sensitivity): 3 ng/L (ref <18)

## 2020-05-09 LAB — COMPREHENSIVE METABOLIC PANEL
ALT: 10 U/L (ref 0–44)
AST: 14 U/L — ABNORMAL LOW (ref 15–41)
Albumin: 3.9 g/dL (ref 3.5–5.0)
Alkaline Phosphatase: 68 U/L (ref 38–126)
Anion gap: 9 (ref 5–15)
BUN: 19 mg/dL (ref 8–23)
CO2: 25 mmol/L (ref 22–32)
Calcium: 10.1 mg/dL (ref 8.9–10.3)
Chloride: 99 mmol/L (ref 98–111)
Creatinine, Ser: 0.99 mg/dL (ref 0.44–1.00)
GFR, Estimated: 56 mL/min — ABNORMAL LOW (ref >60)
Glucose, Bld: 103 mg/dL — ABNORMAL HIGH (ref 70–99)
Potassium: 4.1 mmol/L (ref 3.5–5.1)
Sodium: 133 mmol/L — ABNORMAL LOW (ref 135–145)
Total Bilirubin: 0.6 mg/dL (ref 0.3–1.2)
Total Protein: 8.2 g/dL — ABNORMAL HIGH (ref 6.5–8.1)

## 2020-05-09 LAB — CBG MONITORING, ED: Glucose-Capillary: 116 mg/dL — ABNORMAL HIGH (ref 70–99)

## 2020-05-09 LAB — LIPASE, BLOOD: Lipase: 27 U/L (ref 11–51)

## 2020-05-09 MED ORDER — SODIUM CHLORIDE 0.9 % IV BOLUS
500.0000 mL | Freq: Once | INTRAVENOUS | Status: AC
  Administered 2020-05-09: 500 mL via INTRAVENOUS

## 2020-05-09 MED ORDER — ONDANSETRON HCL 4 MG/2ML IJ SOLN
4.0000 mg | Freq: Once | INTRAMUSCULAR | Status: AC
  Administered 2020-05-09: 4 mg via INTRAVENOUS
  Filled 2020-05-09: qty 2

## 2020-05-09 NOTE — ED Triage Notes (Addendum)
Witnessed syncopal episode at home.   Pt stated she started to feel very weak earlier today , was in a standing position when she passed out , husband assisted her to the ground. Pt denies any pain or hitting head. Pt has normal cognitive and neuro function.

## 2020-05-09 NOTE — ED Provider Notes (Signed)
[facility] Emergency Department Provider Note  ____________________________________________  Time seen: Approximately 10:15 PM  I have reviewed the triage vital signs and the nursing notes.   HISTORY  Chief Complaint Loss of Consciousness (Witnessed syncopal episode at home. )    HPI Carly Peck is a 87 y.o. female with a history of anxiety, breast cancer, GERD, hypertension  reports that she was feeling tired earlier today, and this afternoon she was sitting in the kitchen, started to have some upper abdominal discomfort and nausea.  She got up to walk to where her husband was and started to feel very lightheaded.  She tried to rest on the couch for several minutes but wasn't feeling better and then when she tried to walk to the kitchen for some water, she passed out.  Her husband was there, gently lowered her to the ground without a fall or trauma.  After short amount of time she woke back up.  There was no convulsive activity or urinary incontinence.  No postictal phase.  Patient denies any chest pain or shortness of breath or radiating pain.  The abdominal pain was mild, resolved afterward.  Currently she is asymptomatic.  She believes that she has been eating and drinking normally, no vomiting or diarrhea, no fevers, no body aches.  No sick contacts.  She does note that she is been under a lot of stress lately due to the recent death of two friends from cancer, one of which was very unexpected.     Past Medical History:  Diagnosis Date  . Anxiety   . Arthritis   . Breast cancer ([place]) 1997   left breast cancer  . Cancer ([place])   . Environmental and seasonal allergies   . GERD (gastroesophageal reflux disease)   . History of left breast cancer   . Hyperlipidemia   . Hypertension   . Left bundle branch block (LBBB)   . Neuropathy   . Personal history of chemotherapy 1997   Left breast  . Personal history of radiation therapy 1997   Left breast      Patient Active Problem List   Diagnosis Date Noted  . Tic [place] 08/15/2019  . MGUS (monoclonal gammopathy of unknown significance) 12/15/2016  . History of SCC (squamous cell carcinoma) of skin 09/15/2016  . SCC (squamous cell carcinoma), face 09/15/2016  . Health care maintenance 10/07/2015  . S/P total knee arthroplasty 09/14/2015  . Leg pain 04/07/2015  . Numbness and tingling 04/06/2015  . Benign essential HTN 02/24/2015  . Combined fat and carbohydrate induced hyperlipemia 02/24/2015  . Edema leg 02/09/2015  . Block, bundle branch, left 02/09/2015  . Bilateral leg edema 02/09/2015  . Age related osteoporosis 06/17/2014  . Macroglobulinemia of Waldenstrom ([place]) 06/17/2014  . Waldenstrom's macroglobulinemia ([place]) 06/17/2014  . Generalized OA 11/30/2013  . HLD (hyperlipidemia) 11/30/2013  . Benign hypertension 11/30/2013  . Headache, migraine 11/30/2013  . Arthritis, degenerative 08/27/2013     Past Surgical History:  Procedure Laterality Date  . BACK SURGERY    . BREAST SURGERY    . CARDIAC CATHETERIZATION    . DILATION AND CURETTAGE OF UTERUS    . EYE SURGERY Bilateral    Cataract Extraction with IOL  . KNEE ARTHROPLASTY Right 09/14/2015   Procedure: COMPUTER ASSISTED TOTAL KNEE ARTHROPLASTY;  Surgeon: Dereck Leep, MD;  Location: [facility] ORS;  Service: Orthopedics;  Laterality: Right;  . KNEE ARTHROPLASTY Left 05/30/2016   Procedure: COMPUTER ASSISTED TOTAL KNEE ARTHROPLASTY;  Surgeon: Dereck Leep, MD;  Location: [facility] ORS;  Service: Orthopedics;  Laterality: Left;  . KYPHOPLASTY  2016   Dr. Rudene Christians, [facility]  . MASTECTOMY Left 1997   chemo rad mastectomy  . TONSILLECTOMY       Prior to Admission medications   Medication Sig Start Date End Date Taking? Authorizing Provider  acetaminophen (TYLENOL) 500 MG tablet Take 1,000 mg by mouth 2 (two) times daily as needed for mild pain or headache.     [provider]  carvedilol (COREG) 12.5 MG tablet Take  12.5 mg by mouth 2 (two) times daily with a meal.    [provider]  celecoxib (CELEBREX) 200 MG capsule Take 200 mg by mouth daily.    [provider]  cetirizine (ZYRTEC) 10 MG tablet Take 10 mg by mouth daily.    [provider]  Cholecalciferol (VITAMIN D3) 2000 units TABS Take 2,000 Units by mouth daily.    [provider]  clobetasol ointment (TEMOVATE) 0.05 % APPLY TO AFFECTED AREA DAILY AS NEEDED for rash on buttocks 01/21/15   [provider]  Cyanocobalamin (VITAMIN B-12) 1000 MCG SUBL Place under the tongue.    [provider]  fluticasone (FLONASE) 50 MCG/ACT nasal spray Place 2 sprays into both nostrils 2 (two) times daily as needed for rhinitis.    [provider]  gabapentin (NEURONTIN) 300 MG capsule Take 300 mg by mouth 3 (three) times daily. 07/05/19   [provider]  losartan (COZAAR) 50 MG tablet Take 1 tablet by mouth daily. 11/19/19 11/18/20  [provider]  omeprazole (PRILOSEC) 20 MG capsule Take 20 mg by mouth daily.    [provider]  sennosides-docusate sodium (SENOKOT-S) 8.6-50 MG tablet Take 4 tablets by mouth at bedtime.     [provider]  simvastatin (ZOCOR) 40 MG tablet Take 40 mg by mouth at bedtime.    [provider]  spironolactone (ALDACTONE) 25 MG tablet Take 1 tablet by mouth daily. 10/17/19 10/16/20  [provider]     Allergies Adhesive [tape] and Ultram [tramadol]   Family History  Problem Relation Age of Onset  . Breast cancer Neg Hx     Social History Social History   Tobacco Use  . Smoking status: Never Smoker  . Smokeless tobacco: Never Used  Substance Use Topics  . Alcohol use: Yes    Alcohol/week: 1.0 standard drink    Types: 1 Glasses of wine per week    Comment: occ.  . Drug use: No    Review of Systems  Constitutional:   No fever or chills.  ENT:   No sore throat. No rhinorrhea. Cardiovascular:   No chest  pain positive syncope. Respiratory:   No dyspnea or cough. Gastrointestinal: Positive for upper abdominal pain without vomiting and diarrhea.  Musculoskeletal:   Negative for focal pain or swelling All other systems reviewed and are negative except as documented above in ROS and HPI.  ____________________________________________   PHYSICAL EXAM:  VITAL SIGNS: ED Triage Vitals  Enc Vitals Group     BP 05/09/20 1716 137/71     Pulse Rate 05/09/20 1716 64     Resp 05/09/20 1716 18     Temp 05/09/20 1716 97.6 F (36.4 C)     Temp Source 05/09/20 1716 Oral     SpO2 05/09/20 1716 100 %     Weight 05/09/20 1718 156 lb 1.4 oz (70.8 kg)     Height 05/09/20 1718  5\' 5"  (1.651 m)     Head Circumference --      Peak Flow --      Pain Score 05/09/20 1717 0     Pain Loc --      Pain Edu? --      Excl. in [place]? --     Vital signs reviewed, nursing assessments reviewed.   Constitutional:   Alert and oriented. Non-toxic appearance. Eyes:   Conjunctivae are normal. EOMI. PERRL. ENT      Head:   Normocephalic and atraumatic.      Nose:   Wearing a mask.      Mouth/Throat:   Wearing a mask.      Neck:   No meningismus. Full ROM. Hematological/Lymphatic/Immunilogical:   No cervical lymphadenopathy. Cardiovascular:   RRR. Symmetric bilateral radial and DP pulses.  No murmurs. Cap refill less than 2 seconds. Respiratory:   Normal respiratory effort without tachypnea/retractions. Breath sounds are clear and equal bilaterally. No wheezes/rales/rhonchi. Gastrointestinal:   Soft with mild suprapubic tenderness.  No pulsatile mass.  Non distended. There is no CVA tenderness.  No rebound, rigidity, or guarding.  Musculoskeletal:   Normal range of motion in all extremities. No joint effusions.  No lower extremity tenderness.  No edema. Neurologic:   Normal speech and language.  Motor grossly intact. No acute focal neurologic deficits are appreciated.  Skin:    Skin is warm, dry and intact. No rash  noted.  No petechiae, purpura, or bullae.  ____________________________________________    LABS (pertinent positives/negatives) (all labs ordered are listed, but only abnormal results are displayed) Labs Reviewed  COMPREHENSIVE METABOLIC PANEL - Abnormal; Notable for the following components:      Result Value   Sodium 133 (*)    Glucose, Bld 103 (*)    Total Protein 8.2 (*)    AST 14 (*)    GFR, Estimated 56 (*)    All other components within normal limits  CBG MONITORING, ED - Abnormal; Notable for the following components:   Glucose-Capillary 116 (*)    All other components within normal limits  LIPASE, BLOOD  CBC WITH DIFFERENTIAL/PLATELET  URINALYSIS, COMPLETE (UACMP) WITH MICROSCOPIC  TROPONIN I (HIGH SENSITIVITY)  TROPONIN I (HIGH SENSITIVITY)   ____________________________________________   EKG  Interpreted by me Normal sinus rhythm rate of 59.  Normal axis.  Left bundle branch block.  No acute ischemic changes.  ____________________________________________    RADIOLOGY  DG Abdomen Acute W/Chest  Result Date: 05/09/2020 CLINICAL DATA:  Upper abdominal pain, nausea, witnessed syncopal episode at home; history of breast cancer, hypertension, GERD EXAM: DG ABDOMEN ACUTE WITH 1 VIEW CHEST COMPARISON:  Chest radiograph 04/28/2013 FINDINGS: Normal heart size, mediastinal contours, and pulmonary vascularity. Atherosclerotic calcification aorta. Linear LEFT basilar scarring unchanged since 2015. Minimal atelectasis at RIGHT base. No acute infiltrate, pleural effusion or pneumothorax. Surgical clips LEFT axilla. Bones demineralized. IMPRESSION: LEFT basilar scarring. Minimal subsegmental atelectasis RIGHT base. Aortic Atherosclerosis (ICD10-I70.0). Electronically Signed   By: Lavonia Dana M.D.   On: 05/09/2020 19:56    ____________________________________________   PROCEDURES Procedures  ____________________________________________  DIFFERENTIAL DIAGNOSIS    Dehydration, electrolyte abnormality, vasovagal syncope, non-STEMI, pancreatitis, gastritis  CLINICAL IMPRESSION / ASSESSMENT AND PLAN / ED COURSE  Medications ordered in the ED: Medications  sodium chloride 0.9 % bolus 500 mL (0 mLs Intravenous Stopped 05/09/20 2050)  ondansetron (ZOFRAN) injection 4 mg (4 mg Intravenous Given 05/09/20 1923)    Pertinent labs & imaging results that were available  during my care of the patient were reviewed by me and considered in my medical decision making (see chart for details).  Lindsee T Olejniczak was evaluated in Emergency Department on 05/09/2020 for the symptoms described in the history of present illness. She was evaluated in the context of the global COVID-19 pandemic, which necessitated consideration that the patient might be at risk for infection with the SARS-CoV-2 virus that causes COVID-19. Institutional protocols and algorithms that pertain to the evaluation of patients at risk for COVID-19 are in a state of rapid change based on information released by regulatory bodies including the CDC and federal and state organizations. These policies and algorithms were followed during the patient's care in the ED.   Patient presents with brief episode of upper abdominal pain with nausea, lightheadedness that preceded the syncope.  No chest pain or shortness of breath, no back pain.  Now feels normal.  She has been under a lot of stress lately due to recent death of two friends.  Suspect vagal episode, will give IV fluids for hydration, check labs.  ----------------------------------------- 10:18 PM on 05/09/2020 -----------------------------------------  Work-up unremarkable.  Patient continues to feel normal.  Vital signs remained stable.  Suitable for discharge home.  Patient does not wish to wait for urinalysis, reports she'll follow up with her doctor if she has any urinary symptoms.      ____________________________________________   FINAL CLINICAL  IMPRESSION(S) / ED DIAGNOSES    Final diagnoses:  Syncope, unspecified syncope type     ED Discharge Orders    None      Portions of this note were generated with dragon dictation software. Dictation errors may occur despite best attempts at proofreading.   Carrie Mew, MD 05/09/20 2221

## 2020-10-26 ENCOUNTER — Other Ambulatory Visit: Payer: Self-pay

## 2020-10-26 ENCOUNTER — Inpatient Hospital Stay: Payer: Medicare Other | Attending: Internal Medicine

## 2020-10-26 DIAGNOSIS — R700 Elevated erythrocyte sedimentation rate: Secondary | ICD-10-CM | POA: Insufficient documentation

## 2020-10-26 DIAGNOSIS — E785 Hyperlipidemia, unspecified: Secondary | ICD-10-CM | POA: Diagnosis not present

## 2020-10-26 DIAGNOSIS — G629 Polyneuropathy, unspecified: Secondary | ICD-10-CM | POA: Insufficient documentation

## 2020-10-26 DIAGNOSIS — K219 Gastro-esophageal reflux disease without esophagitis: Secondary | ICD-10-CM | POA: Insufficient documentation

## 2020-10-26 DIAGNOSIS — Z79899 Other long term (current) drug therapy: Secondary | ICD-10-CM | POA: Insufficient documentation

## 2020-10-26 DIAGNOSIS — D472 Monoclonal gammopathy: Secondary | ICD-10-CM | POA: Insufficient documentation

## 2020-10-26 DIAGNOSIS — D649 Anemia, unspecified: Secondary | ICD-10-CM | POA: Insufficient documentation

## 2020-10-26 DIAGNOSIS — I10 Essential (primary) hypertension: Secondary | ICD-10-CM | POA: Insufficient documentation

## 2020-10-26 DIAGNOSIS — Z9221 Personal history of antineoplastic chemotherapy: Secondary | ICD-10-CM | POA: Insufficient documentation

## 2020-10-26 DIAGNOSIS — R0602 Shortness of breath: Secondary | ICD-10-CM | POA: Diagnosis not present

## 2020-10-26 DIAGNOSIS — Z923 Personal history of irradiation: Secondary | ICD-10-CM | POA: Insufficient documentation

## 2020-10-26 DIAGNOSIS — C880 Waldenstrom macroglobulinemia: Secondary | ICD-10-CM | POA: Diagnosis not present

## 2020-10-26 DIAGNOSIS — I447 Left bundle-branch block, unspecified: Secondary | ICD-10-CM | POA: Diagnosis not present

## 2020-10-26 DIAGNOSIS — Z853 Personal history of malignant neoplasm of breast: Secondary | ICD-10-CM | POA: Diagnosis not present

## 2020-10-26 DIAGNOSIS — R109 Unspecified abdominal pain: Secondary | ICD-10-CM | POA: Diagnosis not present

## 2020-10-26 LAB — CBC WITH DIFFERENTIAL/PLATELET
Abs Immature Granulocytes: 0.01 10*3/uL (ref 0.00–0.07)
Basophils Absolute: 0 10*3/uL (ref 0.0–0.1)
Basophils Relative: 1 %
Eosinophils Absolute: 0.2 10*3/uL (ref 0.0–0.5)
Eosinophils Relative: 2 %
HCT: 33.2 % — ABNORMAL LOW (ref 36.0–46.0)
Hemoglobin: 10.8 g/dL — ABNORMAL LOW (ref 12.0–15.0)
Immature Granulocytes: 0 %
Lymphocytes Relative: 17 %
Lymphs Abs: 1 10*3/uL (ref 0.7–4.0)
MCH: 29.2 pg (ref 26.0–34.0)
MCHC: 32.5 g/dL (ref 30.0–36.0)
MCV: 89.7 fL (ref 80.0–100.0)
Monocytes Absolute: 0.7 10*3/uL (ref 0.1–1.0)
Monocytes Relative: 11 %
Neutro Abs: 4.3 10*3/uL (ref 1.7–7.7)
Neutrophils Relative %: 69 %
Platelets: 251 10*3/uL (ref 150–400)
RBC: 3.7 MIL/uL — ABNORMAL LOW (ref 3.87–5.11)
RDW: 12.8 % (ref 11.5–15.5)
WBC: 6.2 10*3/uL (ref 4.0–10.5)
nRBC: 0 % (ref 0.0–0.2)

## 2020-10-26 LAB — BASIC METABOLIC PANEL
Anion gap: 8 (ref 5–15)
BUN: 14 mg/dL (ref 8–23)
CO2: 26 mmol/L (ref 22–32)
Calcium: 9.7 mg/dL (ref 8.9–10.3)
Chloride: 96 mmol/L — ABNORMAL LOW (ref 98–111)
Creatinine, Ser: 0.79 mg/dL (ref 0.44–1.00)
GFR, Estimated: >60 mL/min (ref >60)
Glucose, Bld: 94 mg/dL (ref 70–99)
Potassium: 4 mmol/L (ref 3.5–5.1)
Sodium: 130 mmol/L — ABNORMAL LOW (ref 135–145)

## 2020-10-26 LAB — SEDIMENTATION RATE: Sed Rate: 86 mm/hr — ABNORMAL HIGH (ref 0–30)

## 2020-10-27 LAB — KAPPA/LAMBDA LIGHT CHAINS
Kappa free light chain: 7.8 mg/L (ref 3.3–19.4)
Kappa, lambda light chain ratio: 0.32 (ref 0.26–1.65)
Lambda free light chains: 24.5 mg/L (ref 5.7–26.3)

## 2020-10-29 LAB — MULTIPLE MYELOMA PANEL, SERUM
Albumin SerPl Elph-Mcnc: 3.4 g/dL (ref 2.9–4.4)
Albumin/Glob SerPl: 1 (ref 0.7–1.7)
Alpha 1: 0.3 g/dL (ref 0.0–0.4)
Alpha2 Glob SerPl Elph-Mcnc: 0.7 g/dL (ref 0.4–1.0)
B-Globulin SerPl Elph-Mcnc: 1.7 g/dL — ABNORMAL HIGH (ref 0.7–1.3)
Gamma Glob SerPl Elph-Mcnc: 0.8 g/dL (ref 0.4–1.8)
Globulin, Total: 3.5 g/dL (ref 2.2–3.9)
IgA: 27 mg/dL — ABNORMAL LOW (ref 64–422)
IgG (Immunoglobin G), Serum: 329 mg/dL — ABNORMAL LOW (ref 586–1602)
IgM (Immunoglobulin M), Srm: 2266 mg/dL — ABNORMAL HIGH (ref 26–217)
M Protein SerPl Elph-Mcnc: 0.9 g/dL — ABNORMAL HIGH
Total Protein ELP: 6.9 g/dL (ref 6.0–8.5)

## 2020-11-02 ENCOUNTER — Inpatient Hospital Stay: Payer: Medicare Other | Admitting: Internal Medicine

## 2020-11-02 ENCOUNTER — Other Ambulatory Visit: Payer: Self-pay

## 2020-11-02 ENCOUNTER — Encounter: Payer: Self-pay | Admitting: Internal Medicine

## 2020-11-02 ENCOUNTER — Inpatient Hospital Stay: Payer: Medicare Other

## 2020-11-02 VITALS — BP 123/59 | HR 66 | Temp 97.7°F | Resp 16 | Ht 64.0 in | Wt 161.0 lb

## 2020-11-02 DIAGNOSIS — R1032 Left lower quadrant pain: Secondary | ICD-10-CM | POA: Diagnosis not present

## 2020-11-02 DIAGNOSIS — D472 Monoclonal gammopathy: Secondary | ICD-10-CM | POA: Diagnosis not present

## 2020-11-02 LAB — CBC WITH DIFFERENTIAL/PLATELET
Abs Immature Granulocytes: 0.04 10*3/uL (ref 0.00–0.07)
Basophils Absolute: 0 10*3/uL (ref 0.0–0.1)
Basophils Relative: 1 %
Eosinophils Absolute: 0.2 10*3/uL (ref 0.0–0.5)
Eosinophils Relative: 3 %
HCT: 35 % — ABNORMAL LOW (ref 36.0–46.0)
Hemoglobin: 11.6 g/dL — ABNORMAL LOW (ref 12.0–15.0)
Immature Granulocytes: 1 %
Lymphocytes Relative: 17 %
Lymphs Abs: 1.1 10*3/uL (ref 0.7–4.0)
MCH: 30.1 pg (ref 26.0–34.0)
MCHC: 33.1 g/dL (ref 30.0–36.0)
MCV: 90.9 fL (ref 80.0–100.0)
Monocytes Absolute: 0.9 10*3/uL (ref 0.1–1.0)
Monocytes Relative: 14 %
Neutro Abs: 3.9 10*3/uL (ref 1.7–7.7)
Neutrophils Relative %: 64 %
Platelets: 285 10*3/uL (ref 150–400)
RBC: 3.85 MIL/uL — ABNORMAL LOW (ref 3.87–5.11)
RDW: 12.9 % (ref 11.5–15.5)
WBC: 6.1 10*3/uL (ref 4.0–10.5)
nRBC: 0 % (ref 0.0–0.2)

## 2020-11-02 LAB — IRON AND TIBC
Iron: 60 ug/dL (ref 28–170)
Saturation Ratios: 16 % (ref 10.4–31.8)
TIBC: 375 ug/dL (ref 250–450)
UIBC: 315 ug/dL

## 2020-11-02 LAB — LACTATE DEHYDROGENASE: LDH: 90 U/L — ABNORMAL LOW (ref 98–192)

## 2020-11-02 LAB — FERRITIN: Ferritin: 23 ng/mL (ref 11–307)

## 2020-11-02 NOTE — Assessment & Plan Note (Addendum)
#  MGUS-IgM lambda- M protein- AUG 2022-- M protein- 1.3 gm/dl; kappa lambda light chain ratio-overall stable.  Not suspecting progression of disease-given the mild anemia/see below.   # Mild intermittent anemia- 10.8- ? etiology STABLE;[colo-many years ago]; chronic left quad pain- order CT scan A/P. Check iron studies today.  Discussed with patient that if iron studies are low-recommend IV iron.  #Chronic neuropathy-nortryptline/ gabapentin-stable [Dr.Potter];  #Chronically elevated ESR 80s to 90s; CRP normal. ? Related MGUS-STABLE.   # History of breast cancer- ER/PR positive high risk/stage III. Clinically no evidence of recurrence.  DEC 2021-diagnostic mammogram normal; stable  # DISPOSITION: will call  # labs today- cbc/iron studies; L[facility].; CT ab/pelvis scan- 1week # follow up TBD- Dr.B  ----------------------------------------------------------- # Follow up in 7 months-MD-labs- 1 week prior- cbc/bmp/ESR/ Multiple myeloma panel; kappa/lamda light chains- Dr.B  Cc; Dr.Anderson.

## 2020-11-02 NOTE — Progress Notes (Signed)
Still has a lot of insomnia. Pain on left side of chest that radiates under left armpit. Also mentioned LLQ pain that has been on for a while.

## 2020-11-02 NOTE — Progress Notes (Signed)
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 degen changes; SEP 2018-M spike- 1.3 gm/dl  # 2016-Temporal A Bx-neg [headaches/diziness].   # Bil LE PN [Dr.Potter- on Neurontin]  # 1997-LEFT BREAST CA Stage III  ER-POS;s/p MASTEC;s/p chemo; s/p RT NSABP- 33; s/p Aromasin.   # ESR- Elevated ? From Monoclonal gammopathy ----------------------------------------------------   DIAGNOSIS: IgM- MGUS   CURRENT/MOST RECENT THERAPY: surveillaince    Macroglobulinemia of Waldenstrom ([place])   INTERVAL HISTORY:  87 year old female patient with above history of IgM-MGUS is here for follow-up.  Patient complains of chronic left-sided abdominal pain/lower quadrant for a "long time".  She also noted to have worsening pain in the last few months.  Comes and goes.  No constipation.  No blood in stools or black stools.  Patient had colonoscopy "many many years ago".  Patient denies any worsening shortness of breath or cough.  However she does short of breath on exertion.  Positive for fatigue.  No fevers or chills.  No new back pain.  Chronic tingling numbness in extremities.  Review of Systems  Constitutional:  Positive for malaise/fatigue. Negative for chills, diaphoresis, fever and weight loss.  HENT:  Negative for nosebleeds and sore throat.   Eyes:  Negative for double vision.  Respiratory:  Positive for shortness of breath. Negative for cough, hemoptysis, sputum production and wheezing.   Cardiovascular:  Negative for palpitations,  orthopnea and leg swelling.  Gastrointestinal:  Negative for abdominal pain, blood in stool, constipation, diarrhea, heartburn, melena, nausea and vomiting.  Genitourinary:  Negative for dysuria, frequency and urgency.  Musculoskeletal:  Positive for back pain and joint pain.  Skin: Negative.  Negative for itching and rash.  Neurological:  Positive for tingling. Negative for dizziness, focal weakness, weakness and headaches.  Endo/Heme/Allergies:  Does not bruise/bleed easily.  Psychiatric/Behavioral:  Negative for depression. The patient is not nervous/anxious and does not have insomnia.   PAST MEDICAL HISTORY :  Past Medical History:  Diagnosis Date   Anxiety    Arthritis    Breast cancer ([place]) 1997   left breast cancer   Cancer ([place])    Environmental and seasonal allergies    GERD (gastroesophageal reflux disease)    History of left breast cancer    Hyperlipidemia    Hypertension    Left bundle branch block (LBBB)    Neuropathy    Personal history of chemotherapy 1997   Left breast   Personal history of radiation therapy 1997   Left breast    PAST SURGICAL HISTORY :   Past Surgical History:  Procedure Laterality Date   BACK SURGERY     BREAST SURGERY     CARDIAC CATHETERIZATION     DILATION AND CURETTAGE OF UTERUS     EYE SURGERY Bilateral    Cataract Extraction with IOL   KNEE ARTHROPLASTY Right 09/14/2015   Procedure: COMPUTER ASSISTED TOTAL KNEE ARTHROPLASTY;  Surgeon: Dereck Leep, MD;  Location: [facility] ORS;  Service: Orthopedics;  Laterality: Right;   KNEE ARTHROPLASTY Left 05/30/2016   Procedure: COMPUTER ASSISTED TOTAL KNEE ARTHROPLASTY;  Surgeon:  Dereck Leep, MD;  Location: [facility] ORS;  Service: Orthopedics;  Laterality: Left;   KYPHOPLASTY  2016   Dr. Rudene Christians, [place] Left 1997   chemo rad mastectomy   TONSILLECTOMY      FAMILY HISTORY :   Family History  Problem Relation Age of Onset   Breast cancer Neg Hx     SOCIAL HISTORY:   Social History    Tobacco Use   Smoking status: Never   Smokeless tobacco: Never  Substance Use Topics   Alcohol use: Yes    Alcohol/week: 1.0 standard drink    Types: 1 Glasses of wine per week    Comment: occ.   Drug use: No    ALLERGIES:  is allergic to adhesive [tape] and ultram [tramadol].  MEDICATIONS:  Current Outpatient Medications  Medication Sig Dispense Refill   acetaminophen (TYLENOL) 500 MG tablet Take 1,000 mg by mouth 2 (two) times daily as needed for mild pain or headache.      carvedilol (COREG) 12.5 MG tablet Take 12.5 mg by mouth 2 (two) times daily with a meal.     cetirizine (ZYRTEC) 10 MG tablet Take 10 mg by mouth daily.     Cholecalciferol (VITAMIN D3) 2000 units TABS Take 2,000 Units by mouth daily.     clobetasol ointment (TEMOVATE) 0.05 % APPLY TO AFFECTED AREA DAILY AS NEEDED for rash on buttocks  3   Cyanocobalamin (VITAMIN B-12) 1000 MCG SUBL Place under the tongue.     fluticasone (FLONASE) 50 MCG/ACT nasal spray Place 2 sprays into both nostrils 2 (two) times daily as needed for rhinitis.     gabapentin (NEURONTIN) 300 MG capsule Take 300 mg by mouth 3 (three) times daily.     losartan (COZAAR) 50 MG tablet Take 1 tablet by mouth daily.     omeprazole (PRILOSEC) 20 MG capsule Take 20 mg by mouth daily.     sennosides-docusate sodium (SENOKOT-S) 8.6-50 MG tablet Take 4 tablets by mouth at bedtime.      simvastatin (ZOCOR) 40 MG tablet Take 40 mg by mouth at bedtime.     spironolactone (ALDACTONE) 25 MG tablet Take 1 tablet by mouth daily.     No current facility-administered medications for this visit.    PHYSICAL EXAMINATION:  BP (!) 123/59 (BP Location: Right Arm, Patient Position: Sitting, Cuff Size: Normal)   Pulse 66   Temp 97.7 F (36.5 C) (Tympanic)   Resp 16   Ht $R'5\' 4"'iT$  (1.626 m)   Wt 161 lb (73 kg)   SpO2 98%   BMI 27.64 kg/m   Filed Weights   11/02/20 0946  Weight: 161 lb (73 kg)    Physical Exam HENT:     Head: Normocephalic and  atraumatic.     Mouth/Throat:     Pharynx: No oropharyngeal exudate.  Eyes:     Pupils: Pupils are equal, round, and reactive to light.  Cardiovascular:     Rate and Rhythm: Normal rate and regular rhythm.  Pulmonary:     Effort: No respiratory distress.     Breath sounds: No wheezing.  Abdominal:     General: Bowel sounds are normal. There is no distension.     Palpations: Abdomen is soft. There is no mass.     Tenderness: no abdominal tenderness There is no guarding or rebound.  Musculoskeletal:        General: No tenderness. Normal range of motion.     Cervical back: Normal range  of motion and neck supple.  Skin:    General: Skin is warm.  Neurological:     Mental Status: She is alert and oriented to person, place, and time.  Psychiatric:        Mood and Affect: Affect normal.    LABORATORY DATA:  I have reviewed the data as listed    Component Value Date/Time   NA 130 (L) 10/26/2020 1326   NA 132 (L) 02/02/2014 1947   K 4.0 10/26/2020 1326   K 4.3 02/02/2014 1947   CL 96 (L) 10/26/2020 1326   CL 100 02/02/2014 1947   CO2 26 10/26/2020 1326   CO2 21 02/02/2014 1947   GLUCOSE 94 10/26/2020 1326   GLUCOSE 107 (H) 02/02/2014 1947   BUN 14 10/26/2020 1326   BUN 22 (H) 02/02/2014 1947   CREATININE 0.79 10/26/2020 1326   CREATININE 0.94 02/02/2014 1947   CALCIUM 9.7 10/26/2020 1326   CALCIUM 10.2 12/08/2014 1540   PROT 8.2 (H) 05/09/2020 1834   PROT 7.7 10/21/2013 1106   ALBUMIN 3.9 05/09/2020 1834   ALBUMIN 3.3 (L) 10/21/2013 1106   AST 14 (L) 05/09/2020 1834   AST 16 10/21/2013 1106   ALT 10 05/09/2020 1834   ALT 25 10/21/2013 1106   ALKPHOS 68 05/09/2020 1834   ALKPHOS 83 10/21/2013 1106   BILITOT 0.6 05/09/2020 1834   BILITOT 0.5 10/21/2013 1106   GFRNONAA >60 10/26/2020 1326   GFRNONAA >60 02/02/2014 1947   GFRNONAA 57 (L) 10/21/2013 1106   GFRAA >60 09/25/2019 1041   GFRAA >60 02/02/2014 1947   GFRAA >60 10/21/2013 1106    No results found for:  SPEP, UPEP  Lab Results  Component Value Date   WBC 6.1 11/02/2020   NEUTROABS 3.9 11/02/2020   HGB 11.6 (L) 11/02/2020   HCT 35.0 (L) 11/02/2020   MCV 90.9 11/02/2020   PLT 285 11/02/2020      Chemistry      Component Value Date/Time   NA 130 (L) 10/26/2020 1326   NA 132 (L) 02/02/2014 1947   K 4.0 10/26/2020 1326   K 4.3 02/02/2014 1947   CL 96 (L) 10/26/2020 1326   CL 100 02/02/2014 1947   CO2 26 10/26/2020 1326   CO2 21 02/02/2014 1947   BUN 14 10/26/2020 1326   BUN 22 (H) 02/02/2014 1947   CREATININE 0.79 10/26/2020 1326   CREATININE 0.94 02/02/2014 1947      Component Value Date/Time   CALCIUM 9.7 10/26/2020 1326   CALCIUM 10.2 12/08/2014 1540   ALKPHOS 68 05/09/2020 1834   ALKPHOS 83 10/21/2013 1106   AST 14 (L) 05/09/2020 1834   AST 16 10/21/2013 1106   ALT 10 05/09/2020 1834   ALT 25 10/21/2013 1106   BILITOT 0.6 05/09/2020 1834   BILITOT 0.5 10/21/2013 1106       RADIOGRAPHIC STUDIES: I have personally reviewed the radiological images as listed and agreed with the findings in the report. No results found.   ASSESSMENT & PLAN:  MGUS (monoclonal gammopathy of unknown significance) # MGUS-IgM lambda- M protein- AUG 2022-- M protein- 1.3 gm/dl; kappa lambda light chain ratio-overall stable.  Not suspecting progression of disease-given the mild anemia/see below.   # Mild intermittent anemia- 10.8- ? etiology STABLE;[colo-many years ago]; chronic left quad pain- order CT scan A/P. Check iron studies today.  Discussed with patient that if iron studies are low-recommend IV iron.  #Chronic neuropathy-nortryptline/ gabapentin-stable [Dr.Potter];  #Chronically elevated ESR 80s to  90s; CRP normal. ? Related MGUS-STABLE.   # History of breast cancer- ER/PR positive high risk/stage III. Clinically no evidence of recurrence.  DEC 2021-diagnostic mammogram normal; stable  # DISPOSITION: will call  # labs today- cbc/iron studies; LDH.; CT ab/pelvis scan- 1week #  follow up TBD- Dr.B  ----------------------------------------------------------- # Follow up in 7 months-MD-labs- 1 week prior- cbc/bmp/ESR/ Multiple myeloma panel; kappa/lamda light chains- Dr.B  Cc; Dr.Anderson.      Cammie Sickle, MD 11/02/2020 12:44 PM

## 2020-11-11 ENCOUNTER — Ambulatory Visit
Admission: RE | Admit: 2020-11-11 | Discharge: 2020-11-11 | Disposition: A | Discharge status: Home / Self Care | Payer: Medicare Other | Source: Ambulatory Visit | Attending: Internal Medicine | Admitting: Internal Medicine

## 2020-11-11 ENCOUNTER — Other Ambulatory Visit: Payer: Self-pay

## 2020-11-11 DIAGNOSIS — R1032 Left lower quadrant pain: Secondary | ICD-10-CM | POA: Diagnosis not present

## 2020-11-11 MED ORDER — IOHEXOL 300 MG/ML  SOLN
85.0000 mL | Freq: Once | INTRAMUSCULAR | Status: AC | PRN
  Administered 2020-11-11: 85 mL via INTRAVENOUS

## 2020-12-15 ENCOUNTER — Telehealth: Payer: Self-pay | Admitting: *Deleted

## 2020-12-15 ENCOUNTER — Encounter: Payer: Self-pay | Admitting: Internal Medicine

## 2020-12-15 DIAGNOSIS — D472 Monoclonal gammopathy: Secondary | ICD-10-CM

## 2020-12-15 NOTE — Progress Notes (Signed)
I spoke to patient regarding the results of the CT scan. No evidence of any concerns to explain patient's abdominal pain.  Patient admits to reflux like symptoms. Given the suffragist noted on the CT scan recommend Prilosec twice a day. She can follow up with PCP for further recommendations.  Patient recording from bronchitis treated by Dr. Ouida Sills. Her husband was also sick.  Patient will follow up with me in six mon ths- MD, Labs CBCCMP myeloma panel kappa lambda light chin ratio, ESR. Patient will need to have the labs done one week prior to the next visit.

## 2020-12-15 NOTE — Telephone Encounter (Signed)
Patient will follow up with me in six mon ths- MD, Labs CBCCMP myeloma panel kappa lambda light chin ratio, ESR. Patient will need to have the labs done one week prior to the next visit.   [name]- please arrange 6 month follow-up with labs 1-week prior to apt

## 2021-02-19 ENCOUNTER — Other Ambulatory Visit: Payer: Self-pay | Admitting: Internal Medicine

## 2021-02-19 DIAGNOSIS — Z1231 Encounter for screening mammogram for malignant neoplasm of breast: Secondary | ICD-10-CM

## 2021-06-08 ENCOUNTER — Other Ambulatory Visit: Payer: Self-pay

## 2021-06-08 ENCOUNTER — Inpatient Hospital Stay: Payer: Medicare Other | Attending: Internal Medicine

## 2021-06-08 DIAGNOSIS — D649 Anemia, unspecified: Secondary | ICD-10-CM | POA: Diagnosis not present

## 2021-06-08 DIAGNOSIS — Z853 Personal history of malignant neoplasm of breast: Secondary | ICD-10-CM | POA: Insufficient documentation

## 2021-06-08 DIAGNOSIS — D472 Monoclonal gammopathy: Secondary | ICD-10-CM | POA: Diagnosis present

## 2021-06-08 LAB — CBC WITH DIFFERENTIAL/PLATELET
Abs Immature Granulocytes: 0.02 10*3/uL (ref 0.00–0.07)
Basophils Absolute: 0 10*3/uL (ref 0.0–0.1)
Basophils Relative: 1 %
Eosinophils Absolute: 0.2 10*3/uL (ref 0.0–0.5)
Eosinophils Relative: 2 %
HCT: 35.9 % — ABNORMAL LOW (ref 36.0–46.0)
Hemoglobin: 11.8 g/dL — ABNORMAL LOW (ref 12.0–15.0)
Immature Granulocytes: 0 %
Lymphocytes Relative: 17 %
Lymphs Abs: 1.1 10*3/uL (ref 0.7–4.0)
MCH: 29.5 pg (ref 26.0–34.0)
MCHC: 32.9 g/dL (ref 30.0–36.0)
MCV: 89.8 fL (ref 80.0–100.0)
Monocytes Absolute: 0.8 10*3/uL (ref 0.1–1.0)
Monocytes Relative: 12 %
Neutro Abs: 4.5 10*3/uL (ref 1.7–7.7)
Neutrophils Relative %: 68 %
Platelets: 291 10*3/uL (ref 150–400)
RBC: 4 MIL/uL (ref 3.87–5.11)
RDW: 13.1 % (ref 11.5–15.5)
WBC: 6.6 10*3/uL (ref 4.0–10.5)
nRBC: 0 % (ref 0.0–0.2)

## 2021-06-08 LAB — COMPREHENSIVE METABOLIC PANEL
ALT: 8 U/L (ref 0–44)
AST: 15 U/L (ref 15–41)
Albumin: 3.8 g/dL (ref 3.5–5.0)
Alkaline Phosphatase: 58 U/L (ref 38–126)
Anion gap: 8 (ref 5–15)
BUN: 12 mg/dL (ref 8–23)
CO2: 26 mmol/L (ref 22–32)
Calcium: 10.3 mg/dL (ref 8.9–10.3)
Chloride: 95 mmol/L — ABNORMAL LOW (ref 98–111)
Creatinine, Ser: 0.69 mg/dL (ref 0.44–1.00)
GFR, Estimated: >60 mL/min (ref >60)
Glucose, Bld: 101 mg/dL — ABNORMAL HIGH (ref 70–99)
Potassium: 4.4 mmol/L (ref 3.5–5.1)
Sodium: 129 mmol/L — ABNORMAL LOW (ref 135–145)
Total Bilirubin: 0.4 mg/dL (ref 0.3–1.2)
Total Protein: 8 g/dL (ref 6.5–8.1)

## 2021-06-08 LAB — SEDIMENTATION RATE: Sed Rate: 81 mm/hr — ABNORMAL HIGH (ref 0–30)

## 2021-06-09 LAB — KAPPA/LAMBDA LIGHT CHAINS
Kappa free light chain: 8.4 mg/L (ref 3.3–19.4)
Kappa, lambda light chain ratio: 0.38 (ref 0.26–1.65)
Lambda free light chains: 22.4 mg/L (ref 5.7–26.3)

## 2021-06-15 ENCOUNTER — Inpatient Hospital Stay: Payer: Medicare Other | Admitting: Nurse Practitioner

## 2021-06-15 ENCOUNTER — Other Ambulatory Visit: Payer: Self-pay

## 2021-06-15 ENCOUNTER — Encounter: Payer: Self-pay | Admitting: Nurse Practitioner

## 2021-06-15 VITALS — BP 124/55 | HR 91 | Temp 97.5°F | Ht 64.0 in | Wt 150.6 lb

## 2021-06-15 DIAGNOSIS — D649 Anemia, unspecified: Secondary | ICD-10-CM | POA: Diagnosis not present

## 2021-06-15 DIAGNOSIS — D472 Monoclonal gammopathy: Secondary | ICD-10-CM | POA: Diagnosis not present

## 2021-06-15 NOTE — Progress Notes (Signed)
[place] ?OFFICE PROGRESS NOTE ? ?Patient Care Team: ?Kirk Ruths, MD as PCP - General (Internal Medicine) ?Cammie Sickle, MD as Consulting Physician (Internal Medicine) ? ? ?SUMMARY OF ONCOLOGIC HISTORY: ? 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 degen changes; SEP 2018-M spike- 1.3 gm/dl ? ?# 2016-Temporal A Bx-neg [headaches/diziness].  ? ?# Bil LE PN [Dr.Potter- on Neurontin] ? ?# 1997-LEFT BREAST CA Stage III  ER-POS;s/p MASTEC;s/p chemo; s/p RT NSABP- 33; s/p Aromasin.  ? ?# ESR- Elevated ? From Monoclonal gammopathy ?----------------------------------------------------  ? ?DIAGNOSIS: IgM- MGUS ? ? ?CURRENT/MOST RECENT THERAPY: surveillaince ? ?  ?Macroglobulinemia of Waldenstrom ([place])  ? ?INTERVAL HISTORY: ? ?88 year old female patient with above history of IgM-MGUS is here for follow-up. She had covid several months ago and wasn't able to eat. Had acid reflux like symptoms and loss of taste. She continues to recover from this. Her husband's health has worsened and she is his primary care giver. She has chronic left lower abdominal pain that comes and goes. Denies black or bloody stools. Pain hasn't increased in frequency or in severity. She has left sided chest wall pain intermittently post mastectomy. Continues to have mammograms of right breast done by her pcp. Numbness and tingling is ongoing.  ? ?Review of Systems  ?Constitutional:  Positive for malaise/fatigue. Negative for chills, diaphoresis, fever and weight loss.  ?HENT:  Negative for nosebleeds and sore throat.   ?Eyes:  Negative for double vision.  ?Respiratory:  Positive for shortness of breath. Negative for cough, hemoptysis, sputum production and wheezing.   ?Cardiovascular:   Negative for palpitations, orthopnea and leg swelling.  ?Gastrointestinal:  Negative for abdominal pain, blood in stool, constipation, diarrhea, heartburn, melena, nausea and vomiting.  ?Genitourinary:  Negative for dysuria, frequency and urgency.  ?Musculoskeletal:  Positive for back pain and joint pain.  ?Skin: Negative.  Negative for itching and rash.  ?Neurological:  Positive for tingling. Negative for dizziness, focal weakness, weakness and headaches.  ?Endo/Heme/Allergies:  Does not bruise/bleed easily.  ?Psychiatric/Behavioral:  Negative for depression. The patient is not nervous/anxious and does not have insomnia.   ?PAST MEDICAL HISTORY :  ?Past Medical History:  ?Diagnosis Date  ? Anxiety   ? Arthritis   ? Breast cancer ([place]) 1997  ? left breast cancer  ? Cancer [facility])   ? Environmental and seasonal allergies   ? GERD (gastroesophageal reflux disease)   ? History of left breast cancer   ? Hyperlipidemia   ? Hypertension   ? Left bundle branch block (LBBB)   ? Neuropathy   ? Personal history of chemotherapy 1997  ? Left breast  ? Personal history of radiation therapy 1997  ? Left breast  ? ? ?PAST SURGICAL HISTORY :   ?Past Surgical History:  ?Procedure Laterality Date  ? BACK SURGERY    ? BREAST SURGERY    ? CARDIAC CATHETERIZATION    ? DILATION AND CURETTAGE OF UTERUS    ? EYE SURGERY Bilateral   ? Cataract Extraction with IOL  ? KNEE ARTHROPLASTY Right 09/14/2015  ? Procedure: COMPUTER ASSISTED TOTAL KNEE ARTHROPLASTY;  Surgeon: Dereck Leep, MD;  Location: [facility] ORS;  Service: Orthopedics;  Laterality: Right;  ? KNEE ARTHROPLASTY Left 05/30/2016  ? Procedure: COMPUTER ASSISTED TOTAL KNEE ARTHROPLASTY;  Surgeon: Dereck Leep, MD;  Location: [facility] ORS;  Service: Orthopedics;  Laterality: Left;  ? KYPHOPLASTY  2016  ? [facility] Rud[facility], [facility]  ? MASTECTOMY Left 1997  ? chemo rad mastectomy  ? TONSILLECTOMY    ? ? ?FAMILY HISTORY :   ?Family History  ?Problem Relation Age of Onset  ? Breast cancer Neg Hx   ? ? ?SOCIAL  HISTORY:   ?Social History  ? ?Tobacco Use  ? Smoking status: Never  ? Smokeless tobacco: Never  ?Substance Use Topics  ? Alcohol use: Yes  ?  Alcohol/week: 1.0 standard drink  ?  Types: 1 Glasses of wine per week  ?  Comment: occ.  ? Drug use: No  ? ? ?ALLERGIES:  is allergic to adhesive [tape]. ? ?MEDICATIONS:  ?Current Outpatient Medications  ?Medication Sig Dispense Refill  ? acetaminophen (TYLENOL) 500 MG tablet Take 1,000 mg by mouth 2 (two) times daily as needed for mild pain or headache.     ? carvedilol (COREG) 12.5 MG tablet Take 12.5 mg by mouth 2 (two) times daily with a meal.    ? cetirizine (ZYRTEC) 10 MG tablet Take 10 mg by mouth daily.    ? Cholecalciferol (VITAMIN D3) 2000 units TABS Take 2,000 Units by mouth daily.    ? clobetasol ointment (TEMOVATE) 0.05 % APPLY TO AFFECTED AREA DAILY AS NEEDED for rash on buttocks  3  ? Cyanocobalamin (VITAMIN B-12) 1000 MCG SUBL Place under the tongue.    ? fluticasone (FLONASE) 50 MCG/ACT nasal spray Place 2 sprays into both nostrils 2 (two) times daily as needed for rhinitis.    ? gabapentin (NEURONTIN) 300 MG capsule Take 300 mg by mouth 3 (three) times daily.    ? omeprazole (PRILOSEC) 20 MG capsule Take 20 mg by mouth daily.    ? sennosides-docusate sodium (SENOKOT-S) 8.6-50 MG tablet Take 4 tablets by mouth at bedtime.     ? simvastatin (ZOCOR) 40 MG tablet Take 40 mg by mouth at bedtime.    ? spironolactone (ALDACTONE) 25 MG tablet Take 1 tablet by mouth daily.    ? ?No current facility-administered medications for this visit.  ? ? ?PHYSICAL EXAMINATION: ? ?BP (!) 124/55 (BP Location: Right Arm, Patient Position: Sitting, Cuff Size: Normal)   Pulse 91   Temp (!) 97.5 ?F (36.4 ?C) (Tympanic)   Ht $R'5\' 4"'qe$  (1.626 m)   Wt 150 lb 9.6 oz (68.3 kg)   SpO2 97%   BMI 25.85 kg/m?  ? ?Filed Weights  ? 06/15/21 1056  ?Weight: 150 lb 9.6 oz (68.3 kg)  ? ? ?Physical Exam ?HENT:  ?   Head: Normocephalic and atraumatic.  ?   Mouth/Throat:  ?   Pharynx: No  oropharyngeal exudate.  ?Eyes:  ?   Pupils: Pupils are equal, round, and reactive to light.  ?Cardiovascular:  ?   Rate and Rhythm: Normal rate and regular rhythm.  ?Pulmonary:  ?   Effort: No respiratory distress.  ?   Breath sounds: No wheezing.  ?Abdominal:  ?   General: Bowel sounds are normal. There is no distension.  ?   Palpations: Abdomen is soft. There is no mass.  ?   Tenderness: There is no abdominal tenderness. There is no guarding or rebound.  ?Musculoskeletal:     ?   General: No tenderness. Normal range of motion.  ?   Cervical back: Normal range of motion and neck supple.  ?Skin: ?   General: Skin is warm.  ?  Neurological:  ?   Mental Status: She is alert and oriented to person, place, and time.  ?Psychiatric:     ?   Mood and Affect: Affect normal.  ? ? ?LABORATORY DATA:  ?I have reviewed the data as listed ?   ?Component Value Date/Time  ? NA 129 (L) 06/08/2021 1059  ? NA 132 (L) 02/02/2014 1947  ? K 4.4 06/08/2021 1059  ? K 4.3 02/02/2014 1947  ? CL 95 (L) 06/08/2021 1059  ? CL 100 02/02/2014 1947  ? CO2 26 06/08/2021 1059  ? CO2 21 02/02/2014 1947  ? GLUCOSE 101 (H) 06/08/2021 1059  ? GLUCOSE 107 (H) 02/02/2014 1947  ? BUN 12 06/08/2021 1059  ? BUN 22 (H) 02/02/2014 1947  ? CREATININE 0.69 06/08/2021 1059  ? CREATININE 0.94 02/02/2014 1947  ? CALCIUM 10.3 06/08/2021 1059  ? CALCIUM 10.2 12/08/2014 1540  ? PROT 8.0 06/08/2021 1059  ? PROT 7.7 10/21/2013 1106  ? ALBUMIN 3.8 06/08/2021 1059  ? ALBUMIN 3.3 (L) 10/21/2013 1106  ? AST 15 06/08/2021 1059  ? AST 16 10/21/2013 1106  ? ALT 8 06/08/2021 1059  ? ALT 25 10/21/2013 1106  ? ALKPHOS 58 06/08/2021 1059  ? ALKPHOS 83 10/21/2013 1106  ? BILITOT 0.4 06/08/2021 1059  ? BILITOT 0.5 10/21/2013 1106  ? GFRNONAA >60 06/08/2021 1059  ? GFRNONAA >60 02/02/2014 1947  ? GFRNONAA 57 (L) 10/21/2013 1106  ? GFRAA >60 09/25/2019 1041  ? GFRAA >60 02/02/2014 1947  ? GFRAA >60 10/21/2013 1106  ? ? ?No results found for: SPEP, UPEP ? ?Lab Results  ?Component Value  Date  ? WBC 6.6 06/08/2021  ? NEUTROABS 4.5 06/08/2021  ? HGB 11.8 (L) 06/08/2021  ? HCT 35.9 (L) 06/08/2021  ? MCV 89.8 06/08/2021  ? PLT 291 06/08/2021  ? ? ?  Chemistry   ?   ?Component Value Date/Time  ? NA 129 (L) 03/

## 2021-06-15 NOTE — Progress Notes (Signed)
Had covid 2.5 months ago and couldn't eat. ? ?Having pain over left breast area from time to time. ? ?She is having to care for her husband a bit more now. ?

## 2021-06-16 LAB — MULTIPLE MYELOMA PANEL, SERUM
Albumin SerPl Elph-Mcnc: 3.1 g/dL (ref 2.9–4.4)
Albumin/Glob SerPl: 0.8 (ref 0.7–1.7)
Alpha 1: 0.5 g/dL — ABNORMAL HIGH (ref 0.0–0.4)
Alpha2 Glob SerPl Elph-Mcnc: 1.3 g/dL — ABNORMAL HIGH (ref 0.4–1.0)
B-Globulin SerPl Elph-Mcnc: 1.1 g/dL (ref 0.7–1.3)
Gamma Glob SerPl Elph-Mcnc: 1.2 g/dL (ref 0.4–1.8)
Globulin, Total: 4.2 g/dL — ABNORMAL HIGH (ref 2.2–3.9)
IgA: 24 mg/dL — ABNORMAL LOW (ref 64–422)
IgG (Immunoglobin G), Serum: 364 mg/dL — ABNORMAL LOW (ref 586–1602)
IgM (Immunoglobulin M), Srm: 2535 mg/dL — ABNORMAL HIGH (ref 26–217)
M Protein SerPl Elph-Mcnc: 0.8 g/dL — ABNORMAL HIGH
Total Protein ELP: 7.3 g/dL (ref 6.0–8.5)

## 2021-06-29 ENCOUNTER — Ambulatory Visit
Admission: RE | Admit: 2021-06-29 | Discharge: 2021-06-29 | Disposition: A | Discharge status: Home / Self Care | Payer: Medicare Other | Source: Ambulatory Visit | Attending: Internal Medicine | Admitting: Internal Medicine

## 2021-06-29 DIAGNOSIS — Z1231 Encounter for screening mammogram for malignant neoplasm of breast: Secondary | ICD-10-CM | POA: Insufficient documentation

## 2021-06-30 ENCOUNTER — Encounter: Payer: Self-pay | Admitting: Oncology

## 2022-01-10 ENCOUNTER — Inpatient Hospital Stay: Payer: Medicare Other | Attending: Internal Medicine

## 2022-01-10 DIAGNOSIS — D472 Monoclonal gammopathy: Secondary | ICD-10-CM | POA: Diagnosis present

## 2022-01-10 DIAGNOSIS — G629 Polyneuropathy, unspecified: Secondary | ICD-10-CM | POA: Insufficient documentation

## 2022-01-10 DIAGNOSIS — Z853 Personal history of malignant neoplasm of breast: Secondary | ICD-10-CM | POA: Insufficient documentation

## 2022-01-10 DIAGNOSIS — R700 Elevated erythrocyte sedimentation rate: Secondary | ICD-10-CM | POA: Diagnosis not present

## 2022-01-10 LAB — COMPREHENSIVE METABOLIC PANEL
ALT: 10 U/L (ref 0–44)
AST: 15 U/L (ref 15–41)
Albumin: 3.7 g/dL (ref 3.5–5.0)
Alkaline Phosphatase: 59 U/L (ref 38–126)
Anion gap: 8 (ref 5–15)
BUN: 13 mg/dL (ref 8–23)
CO2: 26 mmol/L (ref 22–32)
Calcium: 9.9 mg/dL (ref 8.9–10.3)
Chloride: 97 mmol/L — ABNORMAL LOW (ref 98–111)
Creatinine, Ser: 0.67 mg/dL (ref 0.44–1.00)
GFR, Estimated: >60 mL/min (ref >60)
Glucose, Bld: 100 mg/dL — ABNORMAL HIGH (ref 70–99)
Potassium: 3.9 mmol/L (ref 3.5–5.1)
Sodium: 131 mmol/L — ABNORMAL LOW (ref 135–145)
Total Bilirubin: 0.6 mg/dL (ref 0.3–1.2)
Total Protein: 8.3 g/dL — ABNORMAL HIGH (ref 6.5–8.1)

## 2022-01-10 LAB — CBC WITH DIFFERENTIAL/PLATELET
Abs Immature Granulocytes: 0.03 10*3/uL (ref 0.00–0.07)
Basophils Absolute: 0 10*3/uL (ref 0.0–0.1)
Basophils Relative: 1 %
Eosinophils Absolute: 0.2 10*3/uL (ref 0.0–0.5)
Eosinophils Relative: 3 %
HCT: 36.6 % (ref 36.0–46.0)
Hemoglobin: 11.8 g/dL — ABNORMAL LOW (ref 12.0–15.0)
Immature Granulocytes: 1 %
Lymphocytes Relative: 16 %
Lymphs Abs: 0.9 10*3/uL (ref 0.7–4.0)
MCH: 29.5 pg (ref 26.0–34.0)
MCHC: 32.2 g/dL (ref 30.0–36.0)
MCV: 91.5 fL (ref 80.0–100.0)
Monocytes Absolute: 0.6 10*3/uL (ref 0.1–1.0)
Monocytes Relative: 11 %
Neutro Abs: 4 10*3/uL (ref 1.7–7.7)
Neutrophils Relative %: 68 %
Platelets: 301 10*3/uL (ref 150–400)
RBC: 4 MIL/uL (ref 3.87–5.11)
RDW: 13.1 % (ref 11.5–15.5)
WBC: 5.7 10*3/uL (ref 4.0–10.5)
nRBC: 0 % (ref 0.0–0.2)

## 2022-01-10 LAB — SEDIMENTATION RATE: Sed Rate: 92 mm/hr — ABNORMAL HIGH (ref 0–30)

## 2022-01-11 ENCOUNTER — Other Ambulatory Visit: Payer: Medicare Other

## 2022-01-11 LAB — KAPPA/LAMBDA LIGHT CHAINS
Kappa free light chain: 8.7 mg/L (ref 3.3–19.4)
Kappa, lambda light chain ratio: 0.34 (ref 0.26–1.65)
Lambda free light chains: 25.5 mg/L (ref 5.7–26.3)

## 2022-01-13 LAB — MULTIPLE MYELOMA PANEL, SERUM
Albumin SerPl Elph-Mcnc: 3.4 g/dL (ref 2.9–4.4)
Albumin/Glob SerPl: 0.9 (ref 0.7–1.7)
Alpha 1: 0.4 g/dL (ref 0.0–0.4)
Alpha2 Glob SerPl Elph-Mcnc: 0.9 g/dL (ref 0.4–1.0)
B-Globulin SerPl Elph-Mcnc: 2.6 g/dL — ABNORMAL HIGH (ref 0.7–1.3)
Gamma Glob SerPl Elph-Mcnc: 0.3 g/dL — ABNORMAL LOW (ref 0.4–1.8)
Globulin, Total: 4.2 g/dL — ABNORMAL HIGH (ref 2.2–3.9)
IgA: 32 mg/dL — ABNORMAL LOW (ref 64–422)
IgG (Immunoglobin G), Serum: 348 mg/dL — ABNORMAL LOW (ref 586–1602)
IgM (Immunoglobulin M), Srm: 3081 mg/dL — ABNORMAL HIGH (ref 26–217)
M Protein SerPl Elph-Mcnc: 1.5 g/dL — ABNORMAL HIGH
Total Protein ELP: 7.6 g/dL (ref 6.0–8.5)

## 2022-01-18 ENCOUNTER — Inpatient Hospital Stay: Payer: Medicare Other | Admitting: Internal Medicine

## 2022-01-18 ENCOUNTER — Encounter: Payer: Self-pay | Admitting: Internal Medicine

## 2022-01-18 DIAGNOSIS — D472 Monoclonal gammopathy: Secondary | ICD-10-CM

## 2022-01-18 NOTE — Progress Notes (Signed)
I connected with Terryn T Panella on 01/18/22 at  9:30 AM EDT by video enabled telemedicine visit and verified that I am speaking with the correct person using two identifiers.  I discussed the limitations, risks, security and privacy concerns of performing an evaluation and management service by telemedicine and the availability of in-person appointments. I also discussed with the patient that there may be a patient responsible charge related to this service. The patient expressed understanding and agreed to proceed.    Other persons participating in the visit and their role in the encounter: RN/medical reconciliation Patient's location: office Provider's location: home  Oncology History Overview Note  # 2012- WALDENSTROM's MACROGLOBINEMIA vs MGUS- 2012- BMBx- 5% CD 138 pos cells; [March 2016- s/p L1 Bone Bx- <10% cd 138 pos monoclonal plasmacytoid B cells; unchanged from 2012] IgM Lamda CT C/A/P-no LN/spleen;Right pubic rami-sclerosis ? degen changes; SEP 2018-M spike- 1.3 gm/dl  # 2016-Temporal A Bx-neg [headaches/diziness].   # Bil LE PN [Dr.Potter- on Neurontin]  # 1997-LEFT BREAST CA Stage III  ER-POS;s/p MASTEC;s/p chemo; s/p RT NSABP- 33; s/p Aromasin.   # ESR- Elevated ? From Monoclonal gammopathy ----------------------------------------------------   DIAGNOSIS: IgM- MGUS   CURRENT/MOST RECENT THERAPY: surveillaince    Macroglobulinemia of Waldenstrom ([place])    Chief Complaint: MGUS   History of present illness:Carly Peck 88 y.o.  female with history of MGUS- IgM is here for follow-up.  Patient complains of ongoing fatigue.  Otherwise denies any worsening bone pain or joint pains.  No significant weight loss.  No abdominal pain or lumps or bumps.  No fevers or chills.  No new back pain.  Chronic tingling numbness in extremities.  Observation/objective: Alert & oriented x 3. In No acute distress.   Assessment and plan: MGUS (monoclonal gammopathy of unknown  significance) # MGUS-IgM lambda- M protein- OCTOBER  2023-- M protein- 1.5 gm/dl; kappa lambda light chain ratio-overall stable.  Discussed M protein is slightly higher at baseline numbers.  However no significant clinical signs of progression noted except for ongoing worsening fatigue.  Discussed regarding repeating a bone marrow biopsy/PET scan.  Patient reluctant.  I think it is reasonable to repeat follow-up in 4 months.  # Mild intermittent anemia- 11.8- ? etiology STABLE;[colo-many years ago]; AUG 2022 CT AP [chronic left quad pain]- NO lymphadenopathy.   #Chronic neuropathy-nortryptline/ gabapentin-stable [Dr.Potter];STABLE.   #Chronically elevated ESR 80s to 90s; CRP normal. ? Related MGUS-STABLE.   # History of breast cancer- ER/PR positive high risk/stage III. Clinically no evidence of recurrence.  APRIL 2023- diagnostic mammogram normal- STABLE.   # DISPOSITION:  # follow up in 4 months- MD; 10 days prior- labs- cbc/cmp; LDH; ESR; MM panel; K/L light chain ratio- Dr.B  Cc; Dr.Anderson.    Follow-up instructions:  I discussed the assessment and treatment plan with the patient.  The patient was provided an opportunity to ask questions and all were answered.  The patient agreed with the plan and demonstrated understanding of instructions.  The patient was advised to call back or seek an in person evaluation if the symptoms worsen or if the condition fails to improve as anticipated.    Dr. Charlaine Dalton [facility] 01/18/2022 12:00 PM

## 2022-01-18 NOTE — Assessment & Plan Note (Addendum)
#  MGUS-IgM lambda- M protein- OCTOBER  2023-- M protein- 1.5 gm/dl; kappa lambda light chain ratio-overall stable.  Discussed M protein is slightly higher at baseline numbers.  However no significant clinical signs of progression noted except for ongoing worsening fatigue.  Discussed regarding repeating a bone marrow biopsy/PET scan.  Patient reluctant.  I think it is reasonable to repeat follow-up in 4 months.  # Mild intermittent anemia- 11.8- ? etiology STABLE;[colo-many years ago]; AUG 2022 CT AP [chronic left quad pain]- NO lymphadenopathy.   #Chronic neuropathy-nortryptline/ gabapentin-stable [Dr.Potter];STABLE.   #Chronically elevated ESR 80s to 90s; CRP normal. ? Related MGUS-STABLE.   # History of breast cancer- ER/PR positive high risk/stage III. Clinically no evidence of recurrence.  APRIL 2023- diagnostic mammogram normal- STABLE.   # DISPOSITION:  # follow up in 4 months- MD; 10 days prior- labs- cbc/cmp; LDH; ESR; MM panel; K/L light chain ratio- Dr.B  Cc; Dr.Anderson.

## 2022-02-26 ENCOUNTER — Other Ambulatory Visit: Payer: Self-pay

## 2022-02-26 ENCOUNTER — Emergency Department
Admission: EM | Admit: 2022-02-26 | Discharge: 2022-02-26 | Disposition: A | Discharge status: Home / Self Care | Payer: Medicare Other | Attending: Emergency Medicine | Admitting: Emergency Medicine

## 2022-02-26 ENCOUNTER — Emergency Department: Payer: Medicare Other

## 2022-02-26 DIAGNOSIS — M545 Low back pain, unspecified: Secondary | ICD-10-CM | POA: Insufficient documentation

## 2022-02-26 DIAGNOSIS — M549 Dorsalgia, unspecified: Secondary | ICD-10-CM | POA: Diagnosis present

## 2022-02-26 DIAGNOSIS — R1032 Left lower quadrant pain: Secondary | ICD-10-CM | POA: Diagnosis not present

## 2022-02-26 DIAGNOSIS — I700 Atherosclerosis of aorta: Secondary | ICD-10-CM | POA: Insufficient documentation

## 2022-02-26 LAB — COMPREHENSIVE METABOLIC PANEL
ALT: 11 U/L (ref 0–44)
AST: 14 U/L — ABNORMAL LOW (ref 15–41)
Albumin: 3.5 g/dL (ref 3.5–5.0)
Alkaline Phosphatase: 64 U/L (ref 38–126)
Anion gap: 4 — ABNORMAL LOW (ref 5–15)
BUN: 8 mg/dL (ref 8–23)
CO2: 26 mmol/L (ref 22–32)
Calcium: 10.3 mg/dL (ref 8.9–10.3)
Chloride: 107 mmol/L (ref 98–111)
Creatinine, Ser: 0.66 mg/dL (ref 0.44–1.00)
GFR, Estimated: >60 mL/min (ref >60)
Glucose, Bld: 121 mg/dL — ABNORMAL HIGH (ref 70–99)
Potassium: 4.4 mmol/L (ref 3.5–5.1)
Sodium: 137 mmol/L (ref 135–145)
Total Bilirubin: 0.8 mg/dL (ref 0.3–1.2)
Total Protein: 8.1 g/dL (ref 6.5–8.1)

## 2022-02-26 LAB — CBC WITH DIFFERENTIAL/PLATELET
Abs Immature Granulocytes: 0.01 10*3/uL (ref 0.00–0.07)
Basophils Absolute: 0 10*3/uL (ref 0.0–0.1)
Basophils Relative: 1 %
Eosinophils Absolute: 0.1 10*3/uL (ref 0.0–0.5)
Eosinophils Relative: 2 %
HCT: 35.6 % — ABNORMAL LOW (ref 36.0–46.0)
Hemoglobin: 11.4 g/dL — ABNORMAL LOW (ref 12.0–15.0)
Immature Granulocytes: 0 %
Lymphocytes Relative: 21 %
Lymphs Abs: 1 10*3/uL (ref 0.7–4.0)
MCH: 29.5 pg (ref 26.0–34.0)
MCHC: 32 g/dL (ref 30.0–36.0)
MCV: 92 fL (ref 80.0–100.0)
Monocytes Absolute: 0.6 10*3/uL (ref 0.1–1.0)
Monocytes Relative: 13 %
Neutro Abs: 3.1 10*3/uL (ref 1.7–7.7)
Neutrophils Relative %: 63 %
Platelets: 295 10*3/uL (ref 150–400)
RBC: 3.87 MIL/uL (ref 3.87–5.11)
RDW: 13.3 % (ref 11.5–15.5)
WBC: 5 10*3/uL (ref 4.0–10.5)
nRBC: 0 % (ref 0.0–0.2)

## 2022-02-26 LAB — LIPASE, BLOOD: Lipase: 23 U/L (ref 11–51)

## 2022-02-26 LAB — URINALYSIS, ROUTINE W REFLEX MICROSCOPIC
Bilirubin Urine: NEGATIVE
Glucose, UA: NEGATIVE mg/dL
Hgb urine dipstick: NEGATIVE
Ketones, ur: NEGATIVE mg/dL
Leukocytes,Ua: NEGATIVE
Nitrite: NEGATIVE
Protein, ur: NEGATIVE mg/dL
Specific Gravity, Urine: 1.011 (ref 1.005–1.030)
pH: 7 (ref 5.0–8.0)

## 2022-02-26 MED ORDER — LIDOCAINE HCL (PF) 1 % IJ SOLN: 5.0000 mL | Freq: Once | INTRAMUSCULAR | Status: DC

## 2022-02-26 MED ORDER — LIDOCAINE 5 % EX PTCH: 1.0000 | MEDICATED_PATCH | Freq: Two times a day (BID) | CUTANEOUS | 0 refills | Status: AC

## 2022-02-26 MED ORDER — LIDOCAINE 5 % EX PTCH
1.0000 | MEDICATED_PATCH | CUTANEOUS | Status: DC
  Administered 2022-02-26: 1 via TRANSDERMAL
  Filled 2022-02-26: qty 1

## 2022-02-26 MED ORDER — IOHEXOL 300 MG/ML  SOLN
100.0000 mL | Freq: Once | INTRAMUSCULAR | Status: AC | PRN
  Administered 2022-02-26: 100 mL via INTRAVENOUS

## 2022-02-26 NOTE — Discharge Instructions (Addendum)
Your blood work, urine, and CAT scan are normal.  Please follow-up with your outpatient provider in 2 days as you have scheduled.  Please return for any new, worsening, or change in symptoms or other concerns.  It was a pleasure caring for you today.

## 2022-02-26 NOTE — ED Triage Notes (Signed)
Pt states she has been having LL back pain since last night- pt denies injuries or heavy lifting- pt states she was up using the bathroom a lot last night but that her urine was clear

## 2022-02-26 NOTE — ED Provider Notes (Signed)
[facility] Provider Note    Event Date/Time   First MD Initiated Contact with Patient 02/26/22 1016     (approximate)   History   Back Pain   HPI  Carly Peck is a 88 y.o. female who presents today for evaluation of left lower quadrant and left flank pain.  Patient reports her symptoms began last night.  She denies any associated nausea, vomiting, diarrhea.  She has not noticed any dysuria or hematuria.  She does note that she had increased urinary frequency last night.  No fevers or chills.  No chest pain or shortness of breath.  No dizziness or lightheadedness.  No headaches or visual changes.  No urinary or fecal incontinence or retention.  No numbness or tingling anywhere.  No trouble walking.  She denies any falls or trauma.  Patient Active Problem List   Diagnosis Date Noted   Tic [place]oure[place] 08/15/2019   MGUS (monoclonal gammopathy of unknown significance) 12/15/2016   History of SCC (squamous cell carcinoma) of skin 09/15/2016   SCC (squamous cell carcinoma), face 09/15/2016   Health care maintenance 10/07/2015   S/P total knee arthroplasty 09/14/2015   Leg pain 04/07/2015   Numbness and tingling 04/06/2015   Benign essential HTN 02/24/2015   Combined fat and carbohydrate induced hyperlipemia 02/24/2015   Edema leg 02/09/2015   Block, bundle branch, left 02/09/2015   Bilateral leg edema 02/09/2015   Age related osteoporosis 06/17/2014   Macroglobulinemia of [place] ([place]) 06/17/2014   Waldenstrom's macroglobulinemia ([place]) 06/17/2014   Generalized OA 11/30/2013   HLD (hyperlipidemia) 11/30/2013   Benign hypertension 11/30/2013   Headache, migraine 11/30/2013   Arthritis, degenerative 08/27/2013          Physical Exam   Triage Vital Signs: ED Triage Vitals  Enc Vitals Group     BP 02/26/22 0931 (!) 178/91     Pulse Rate 02/26/22 0931 87     Resp 02/26/22 0931 16     Temp 02/26/22 0931 98.6 F (37 C)     Temp Source 02/26/22  0931 Oral     SpO2 02/26/22 0931 97 %     Weight 02/26/22 0932 140 lb (63.5 kg)     Height 02/26/22 0932 '5\' 4"'$  (1.626 m)     Head Circumference --      Peak Flow --      Pain Score 02/26/22 0943 10     Pain Loc --      Pain Edu? --      Excl. in [place]? --     Most recent vital signs: Vitals:   02/26/22 0931 02/26/22 1251  BP: (!) 178/91 (!) 158/88  Pulse: 87 82  Resp: 16 16  Temp: 98.6 F (37 C) 98.3 F (36.8 C)  SpO2: 97% 97%    Physical Exam Vitals and nursing note reviewed.  Constitutional:      General: Awake and alert. No acute distress.    Appearance: Normal appearance. The patient is normal weight.  HENT:     Head: Normocephalic and atraumatic.     Mouth: Mucous membranes are moist.  Eyes:     General: PERRL. Normal EOMs        Right eye: No discharge.        Left eye: No discharge.     Conjunctiva/sclera: Conjunctivae normal.  Cardiovascular:     Rate and Rhythm: Normal rate and regular rhythm.     Pulses: Normal pulses.  Equal in all  4 extremities    Heart sounds: Normal heart sounds Pulmonary:     Effort: Pulmonary effort is normal. No respiratory distress.     Breath sounds: Normal breath sounds.  Abdominal:     Abdomen is soft. There is very mild left lower quadrant abdominal tenderness. No rebound or guarding. No distention. Back: No midline tenderness.  Left-sided lumbar paraspinal muscle tenderness.  Strength and sensation 5/5 to bilateral lower extremities. Normal great toe extension against resistance. Normal sensation throughout feet. Normal patellar reflexes. Negative SLR and opposite SLR bilaterally. Negative FABER test Musculoskeletal:        General: No swelling. Normal range of motion.     Cervical back: Normal range of motion and neck supple.  Skin:    General: Skin is warm and dry.     Capillary Refill: Capillary refill takes less than 2 seconds.     Findings: No rash.  Neurological:     Mental Status: The patient is awake and alert.   Neurological: GCS 15 alert and oriented x3 Normal speech, no expressive or receptive aphasia or dysarthria Cranial nerves II through XII intact Normal visual fields 5 out of 5 strength in all 4 extremities with intact sensation throughout No extremity drift Normal finger-to-nose testing, no limb or truncal ataxia     ED Results / Procedures / Treatments   Labs (all labs ordered are listed, but only abnormal results are displayed) Labs Reviewed  COMPREHENSIVE METABOLIC PANEL - Abnormal; Notable for the following components:      Result Value   Glucose, Bld 121 (*)    AST 14 (*)    Anion gap 4 (*)    All other components within normal limits  CBC WITH DIFFERENTIAL/PLATELET - Abnormal; Notable for the following components:   Hemoglobin 11.4 (*)    HCT 35.6 (*)    All other components within normal limits  URINALYSIS, ROUTINE W REFLEX MICROSCOPIC - Abnormal; Notable for the following components:   Color, Urine YELLOW (*)    APPearance CLEAR (*)    All other components within normal limits  LIPASE, BLOOD     EKG     RADIOLOGY I independently reviewed and interpreted imaging and agree with radiologists findings.     PROCEDURES:  Critical Care performed:   Procedures   MEDICATIONS ORDERED IN ED: Medications  lidocaine (LIDODERM) 5 % 1 patch (1 patch Transdermal Patch Applied 02/26/22 1209)  iohexol (OMNIPAQUE) 300 MG/ML solution 100 mL (100 mLs Intravenous Contrast Given 02/26/22 1117)     IMPRESSION / MDM / ASSESSMENT AND PLAN / ED COURSE  I reviewed the triage vital signs and the nursing notes.   Differential diagnosis includes, but is not limited to, diverticulitis, muscle strain, muscle spasm, nephrolithiasis, pyelonephritis, urinary tract infection.  Patient is awake and alert, hemodynamically stable and afebrile.  She has normal strength and sensation in bilateral lower extremities.  She has got mild tenderness to palpation in her left lower quadrant.   She agreed to blood work, urine, and CT scan which was obtained for evaluation of possible diverticulitis, nephrolithiasis, pyelonephritis, or intra-abdominal pathology which were negative for any acute findings.  She has no chest pain, trouble breathing, neurological deficits, back pain, tearing sensation, and she has equal pulses in all 4 EXTR do not suspect vascular feet.  She has no aneurysm noted on CT. she has no midline vertebral tenderness to suggest vertebral injury.  She has normal strength and sensation in bilateral lower extremities, she is able  to ambulate unassisted, no urinary or fecal incontinence or retention or saddle anesthesia to suggest cord injury.  No trauma.  She was treated symptomatically with a Lidoderm patch with significant improvement of her symptoms.  We discussed strict return precautions and the importance of close outpatient follow-up.  Patient has a PCP appointment in 2 days which I recommended that she keep.  She requested a prescription of the Lidoderm patches to be sent to her pharmacy which was done.  In the meantime she understands return precautions to the emergency department.  She was discharged in stable condition with her husband.  Patient's presentation is most consistent with acute complicated illness / injury requiring diagnostic workup.     FINAL CLINICAL IMPRESSION(S) / ED DIAGNOSES   Final diagnoses:  Acute left-sided low back pain without sciatica     Rx / DC Orders   ED Discharge Orders          Ordered    lidocaine (LIDODERM) 5 %  Every 12 hours        02/26/22 1153             Note:  This document was prepared using Dragon voice recognition software and may include unintentional dictation errors.   Emeline Gins 02/26/22 1314    Lavonia Drafts, MD 02/26/22 1332

## 2022-02-26 NOTE — ED Provider Triage Note (Signed)
Emergency Medicine Provider Triage Evaluation Note  Carly Peck , a 88 y.o. female  was evaluated in triage.  Pt complains of left low back pain that radiates into her left lower quadrant.  Patient denies any change in her stool.  No nausea or vomiting.  No fevers or chills.  No urinary symptoms. No radicular symptoms. .  Review of Systems  Positive: Llq pain, left flank pain Negative: N/v/d, fever  Physical Exam  BP (!) 178/91 (BP Location: Right Arm)   Pulse 87   Temp 98.6 F (37 C) (Oral)   Resp 16   Ht '5\' 4"'$  (1.626 m)   Wt 63.5 kg   SpO2 97%   BMI 24.03 kg/m  Gen:   Awake, no distress   Resp:  Normal effort  MSK:   Moves extremities without difficulty  Other:    Medical Decision Making  Medically screening exam initiated at 9:35 AM.  Appropriate orders placed.  Kelis T Parco was informed that the remainder of the evaluation will be completed by another provider, this initial triage assessment does not replace that evaluation, and the importance of remaining in the ED until their evaluation is complete.     Marquette Old, PA-C 02/26/22 [phone]

## 2022-03-03 ENCOUNTER — Telehealth: Payer: Self-pay | Admitting: *Deleted

## 2022-03-03 ENCOUNTER — Other Ambulatory Visit: Payer: Self-pay | Admitting: *Deleted

## 2022-03-03 ENCOUNTER — Telehealth: Payer: Self-pay | Admitting: Internal Medicine

## 2022-03-03 ENCOUNTER — Emergency Department
Admission: EM | Admit: 2022-03-03 | Discharge: 2022-03-04 | Disposition: A | Discharge status: Home / Self Care | Payer: Medicare Other | Attending: Emergency Medicine | Admitting: Emergency Medicine

## 2022-03-03 DIAGNOSIS — B029 Zoster without complications: Secondary | ICD-10-CM | POA: Insufficient documentation

## 2022-03-03 DIAGNOSIS — M545 Low back pain, unspecified: Secondary | ICD-10-CM | POA: Diagnosis present

## 2022-03-03 MED ORDER — GABAPENTIN 100 MG PO CAPS
100.0000 mg | ORAL_CAPSULE | Freq: Once | ORAL | Status: AC
  Administered 2022-03-03: 100 mg via ORAL
  Filled 2022-03-03: qty 1

## 2022-03-03 MED ORDER — LIDOCAINE 5 % EX PTCH
1.0000 | MEDICATED_PATCH | CUTANEOUS | Status: DC
  Administered 2022-03-03: 1 via TRANSDERMAL
  Filled 2022-03-03: qty 1

## 2022-03-03 MED ORDER — ACETAMINOPHEN 500 MG PO TABS
1000.0000 mg | ORAL_TABLET | Freq: Once | ORAL | Status: AC
  Administered 2022-03-03: 1000 mg via ORAL
  Filled 2022-03-03: qty 2

## 2022-03-03 MED ORDER — OXYCODONE HCL 5 MG PO TABS
5.0000 mg | ORAL_TABLET | Freq: Once | ORAL | Status: AC
  Administered 2022-03-03: 5 mg via ORAL
  Filled 2022-03-03: qty 1

## 2022-03-03 MED ORDER — VALACYCLOVIR HCL 500 MG PO TABS
1000.0000 mg | ORAL_TABLET | Freq: Once | ORAL | Status: AC
  Administered 2022-03-03: 1000 mg via ORAL
  Filled 2022-03-03: qty 2

## 2022-03-03 NOTE — Telephone Encounter (Signed)
I tried to reach the pt's daughter re: pt's worsening back pain. I left a voicemail.   Pt current in [facility] ED- I agree with evaluation further evaluation. I would recommend  MRi with and with out contrast [given Hx of monoclonal gammopathy]as recent CT scan was negative for any acute process.   GB

## 2022-03-03 NOTE — Telephone Encounter (Signed)
Daughter called reporting that patient has has some changes and was instructed to notify us if she had any. Patient has increased back pain and confusion and was seen in ER Saturday and by PCP Tuesday  Angelena Form, MD - 03/01/2022 12:15 PM EST  Formatting of this note might be different from the original. It was great to see you today. Please continue to take your medications as directed and call with any new or concerning symptoms. We will start a prednisone taper and baclofen for muscle relaxer. Apply heating pad to the area. You can continue the lidocaine patches. Referral to Physical therapy for the back pain. Referral made to physiatry if no better.  Electronically signed by Angelena Form, MD at 03/01/2022 12:43 PM EST

## 2022-03-03 NOTE — ED Notes (Signed)
First nurse note-pt brought in via ems from home with back pain.  Pt was seen here last week with similar sx.  No known recent injury.  Pt has not been taking her meds per ems.  Pt alert in wheelchair in the lobby.

## 2022-03-03 NOTE — ED Provider Notes (Signed)
[facility] Provider Note    Event Date/Time   First MD Initiated Contact with Patient 03/03/22 2257     (approximate)   History   Back Pain   HPI  Carly Peck is a 88 y.o. female who presents to the ED for evaluation of Back Pain   I reviewed ED visit from 12/9 and follow-up PCP visit from 12/12 for the same complaints.  Venous contrasted CT scan of abdomen/pelvis during that ED visit without evidence of acute pathology. PCP visit added baclofen and prednisone taper..  Referral placed to PT.  Also given some Norco. Does have history of Waldenstrm's macroglobulinemia  88 year old female returns to the ED, accompanied by her daughter and husband, for evaluation of continued and worsening left lumbar pain.  Atraumatic.  Not significantly improved with a steroid taper and medications provided with a PCP recently.  They are requesting an MRI lumbar spine at the direction of her oncologist.   Physical Exam   Triage Vital Signs: ED Triage Vitals [03/03/22 1759]  Enc Vitals Group     BP (!) 171/94     Pulse Rate 100     Resp 19     Temp 98.1 F (36.7 C)     Temp Source Oral     SpO2 96 %     Weight 137 lb (62.1 kg)     Height      Head Circumference      Peak Flow      Pain Score      Pain Loc      Pain Edu?      Excl. in [place]?     Most recent vital signs: Vitals:   03/03/22 2330 03/04/22 0200  BP: (!) 153/85 (!) 163/78  Pulse: 99 89  Resp:    Temp:    SpO2: 100% 98%    General: Awake, no distress.  Laying on her left side beneath multiple blankets, once I started examining her she is moaning in discomfort, but quiet and seemingly comfortable prior to this. CV:  Good peripheral perfusion.  Resp:  Normal effort.  Abd:  No distention.  MSK:  No deformity noted.  Neuro:  No focal deficits appreciated. Other:  Rash, as below, within the left lumbar dermatome.     ED Results / Procedures / Treatments   Labs (all labs ordered are  listed, but only abnormal results are displayed) Labs Reviewed - No data to display  EKG   RADIOLOGY   Official radiology report(s): MR Lumbar Spine W Wo Contrast  Result Date: 03/04/2022 CLINICAL DATA:  Left lumbar pain EXAM: MRI LUMBAR SPINE WITHOUT AND WITH CONTRAST TECHNIQUE: Multiplanar and multiecho pulse sequences of the lumbar spine were obtained without and with intravenous contrast. CONTRAST:  68m GADAVIST GADOBUTROL 1 MMOL/ML IV SOLN COMPARISON:  05/13/2014 FINDINGS: Segmentation:  Standard Alignment: Grade 1 retrolisthesis at L1-2 and L2-3 with grade 1 anterolisthesis at L4-5. Grade 1 retrolisthesis at L5-S1. Vertebrae: Progression of height loss at the site of L1 compression deformity. Mild wedge compression deformity of L2 is new since 05/13/2014 but there is no bone marrow edema. Conus medullaris and cauda equina: Conus extends to the L1 level. Conus and cauda equina appear normal. Paraspinal and other soft tissues: Negative Disc levels: T10-11: Normal. T11-12: Minimal disc bulge without stenosis. T12-L1: Small disc bulge with endplate spurring, slightly worsened. Mild spinal canal stenosis. L1-L2: Progression of disc space loss with endplate ridging. Mild spinal canal stenosis. Severe  right and mild left neural foraminal stenosis. L2-L3: Small disc bulge. Slightly worsened mild spinal canal stenosis. No neural foraminal stenosis. L3-L4: Unchanged disc bulge and moderate facet hypertrophy. Unchanged moderate spinal canal stenosis. Mild bilateral neural foraminal stenosis. L4-L5: Unchanged severe facet arthrosis with mild worsening of disc bulge. Progression of severe spinal canal stenosis. Unchanged mild bilateral neural foraminal stenosis. L5-S1: Unchanged medium-sized right subarticular disc protrusion in close proximity to the right S1 nerve root in the lateral recess. No central spinal canal stenosis. Mild left neural foraminal stenosis. Visualized sacrum: Normal. IMPRESSION: 1.  Progression of severe spinal canal stenosis at L4-L5 due to combination of disc bulge and facet arthrosis. 2. Progressed L1 and L2 height loss with worsened severe right L1-2 neural foraminal stenosis. 3. Unchanged moderate L3-4 spinal canal stenosis. 4. Unchanged medium-sized right subarticular disc protrusion at L5-S1 in close proximity to the right S1 nerve root in the lateral recess. Electronically Signed   By: Ulyses Jarred M.D.   On: 03/04/2022 01:16    PROCEDURES and INTERVENTIONS:  Procedures  Medications  lidocaine (LIDODERM) 5 % 1 patch (1 patch Transdermal Patch Applied 03/03/22 2348)  gabapentin (NEURONTIN) capsule 100 mg (100 mg Oral Given 03/03/22 2339)  valACYclovir (VALTREX) tablet 1,000 mg (1,000 mg Oral Given 03/03/22 2339)  acetaminophen (TYLENOL) tablet 1,000 mg (1,000 mg Oral Given 03/03/22 2339)  oxyCODONE (Oxy IR/ROXICODONE) immediate release tablet 5 mg (5 mg Oral Given 03/03/22 2340)  gadobutrol (GADAVIST) 1 MMOL/ML injection 6 mL (6 mLs Intravenous Contrast Given 03/04/22 0050)     IMPRESSION / MDM / ASSESSMENT AND PLAN / ED COURSE  I reviewed the triage vital signs and the nursing notes.  Differential diagnosis includes, but is not limited to, myelomatous lesion, shingles, cauda equina, sciatica  {Patient presents with symptoms of an acute illness or injury that is potentially life-threatening.  88 year old female who returns with persistent atraumatic pain, likely shingles.  Exam is clearly consistent with shingles.  We will perform MRI at the direction of her oncologist while we are working on pain control and starting antivirals.  Clinical Course as of 03/04/22 0454  Thu Mar 03, 2022  2346 I discussed plan of care with patient and the family.  We discussed that the herpes zoster rash is the most likely etiology of her pain.  After shared decision-making, we still plan to pursue MRI lumbar spine because with her oncologic concerns. [DS]  Fri Mar 04, 2022   0132 Reassessed.  More comfortable.  We discussed multimodal pain management, Valtrex, MRI results.  Discussed return precautions. [DS]    Clinical Course User Index [DS] Vladimir Crofts, MD     FINAL CLINICAL IMPRESSION(S) / ED DIAGNOSES   Final diagnoses:  Herpes zoster without complication     Rx / DC Orders   ED Discharge Orders          Ordered    valACYclovir (VALTREX) 1000 MG tablet  2 times daily        03/04/22 0140    gabapentin (NEURONTIN) 100 MG capsule  3 times daily        03/04/22 0140             Note:  This document was prepared using Dragon voice recognition software and may include unintentional dictation errors.   Vladimir Crofts, MD 03/04/22 [phone]

## 2022-03-03 NOTE — ED Triage Notes (Signed)
Pt arrives via EMS for back pain. Pt was seen here recently and prescribed meds. Pt sts that the pain is getting worse even though she has been taking the meds. Pt has not been moving around due to the pain and has been sedentary.

## 2022-03-03 NOTE — Progress Notes (Signed)
Returned a phone call for the daughter. Unable to reach.  GB

## 2022-03-03 NOTE — Telephone Encounter (Signed)
[phone]  

## 2022-03-03 NOTE — Telephone Encounter (Signed)
Amardeep Beckers RN received msg from triage at 1540 that daughter has called again to discuss concerns/request an apt. Per Dr. Rogue Bussing- md advised that patient be evaluated in [facility] for further work up with back pain w/o sciatic. Pt has a rising m-protein+ and given the back pain, patient needs an MRI w/ and without contrast of spine. Per Dr. B pt's CT scan was not conclusive of any myelomatous lesions. I spoke with daughter. Pt is in severe pain. Family wants to call 911 and have her transported to the emergency room for the MRI and pain mgmt. Pt lives in [place] and daughter stated that patient may end up going to [facility] instead of [facility]. She is uncertain if EMS would transport to [facility] given the [place] service line.  She accepted an apt tom at 1pm with Merrily Pew, NP

## 2022-03-04 ENCOUNTER — Inpatient Hospital Stay: Payer: Medicare Other | Admitting: Hospice and Palliative Medicine

## 2022-03-04 ENCOUNTER — Telehealth: Payer: Self-pay | Admitting: *Deleted

## 2022-03-04 ENCOUNTER — Emergency Department: Payer: Medicare Other

## 2022-03-04 MED ORDER — GADOBUTROL 1 MMOL/ML IV SOLN
6.0000 mL | Freq: Once | INTRAVENOUS | Status: AC | PRN
  Administered 2022-03-04: 6 mL via INTRAVENOUS

## 2022-03-04 MED ORDER — GABAPENTIN 100 MG PO CAPS: 100.0000 mg | ORAL_CAPSULE | Freq: Three times a day (TID) | ORAL | 0 refills | Status: DC

## 2022-03-04 MED ORDER — VALACYCLOVIR HCL 1 G PO TABS: 1000.0000 mg | ORAL_TABLET | Freq: Two times a day (BID) | ORAL | 0 refills | Status: DC

## 2022-03-04 NOTE — Discharge Instructions (Addendum)
Use Tylenol for pain and fevers.  Up to 1000 mg per dose, up to 4 times per day.  Do not take more than 4000 mg of Tylenol/acetaminophen within 24 hours..  Please use lidocaine patches at your site of pain.  Apply 1 patch at a time, leave on for 12 hours, then remove for 12 hours.  12 hours on, 12 hours off.  Do not apply more than 1 patch at a time.  Use gabapentin 3 times daily for nerve pain  Use Valtrex twice daily for the next week, this is an antiviral  Use the Norco and muscle relaxer as needed as a last resort for more severe pain.  Be very careful mixing the 2 medications together

## 2022-03-04 NOTE — Telephone Encounter (Signed)
Rn spoke with daughter Morey Hummingbird. Patient dx with shingles in the ER visit. Pt has been tx with antivirals and pain meds. Explained to daughter that we can cnl today's apt in the clinic with Merrily Pew, NP. Daughter agreed with plan of care.

## 2022-03-07 ENCOUNTER — Encounter: Payer: Self-pay | Admitting: Oncology

## 2022-03-07 NOTE — Telephone Encounter (Signed)
Patient recently evaluated in the emergency room noted to have shingles.  Also noted to have severe DJD of the lumbar spine.  I reached the patient's daughter-at [phone].  Left a voicemail.

## 2022-03-08 ENCOUNTER — Emergency Department: Payer: Medicare Other

## 2022-03-08 ENCOUNTER — Other Ambulatory Visit: Payer: Self-pay

## 2022-03-08 ENCOUNTER — Encounter: Payer: Self-pay | Admitting: Radiology

## 2022-03-08 ENCOUNTER — Inpatient Hospital Stay
Admission: EM | Admit: 2022-03-08 | Discharge: 2022-03-12 | DRG: 064 | Disposition: A | Discharge status: Home Health | Payer: Medicare Other | Attending: Internal Medicine | Admitting: Internal Medicine

## 2022-03-08 ENCOUNTER — Encounter
Admission: EM | Disposition: A | Discharge status: Home Health | Payer: Self-pay | Source: Home / Self Care | Attending: Internal Medicine

## 2022-03-08 ENCOUNTER — Inpatient Hospital Stay: Payer: Medicare Other

## 2022-03-08 ENCOUNTER — Inpatient Hospital Stay: Payer: Medicare Other | Admitting: Anesthesiology

## 2022-03-08 DIAGNOSIS — K219 Gastro-esophageal reflux disease without esophagitis: Secondary | ICD-10-CM | POA: Diagnosis present

## 2022-03-08 DIAGNOSIS — R7989 Other specified abnormal findings of blood chemistry: Secondary | ICD-10-CM | POA: Diagnosis present

## 2022-03-08 DIAGNOSIS — R339 Retention of urine, unspecified: Secondary | ICD-10-CM | POA: Diagnosis present

## 2022-03-08 DIAGNOSIS — I10 Essential (primary) hypertension: Secondary | ICD-10-CM | POA: Diagnosis present

## 2022-03-08 DIAGNOSIS — I447 Left bundle-branch block, unspecified: Secondary | ICD-10-CM | POA: Diagnosis present

## 2022-03-08 DIAGNOSIS — Z96653 Presence of artificial knee joint, bilateral: Secondary | ICD-10-CM | POA: Diagnosis present

## 2022-03-08 DIAGNOSIS — Z9012 Acquired absence of left breast and nipple: Secondary | ICD-10-CM

## 2022-03-08 DIAGNOSIS — A419 Sepsis, unspecified organism: Secondary | ICD-10-CM

## 2022-03-08 DIAGNOSIS — R55 Syncope and collapse: Secondary | ICD-10-CM | POA: Diagnosis present

## 2022-03-08 DIAGNOSIS — G9341 Metabolic encephalopathy: Secondary | ICD-10-CM | POA: Diagnosis present

## 2022-03-08 DIAGNOSIS — R4182 Altered mental status, unspecified: Secondary | ICD-10-CM

## 2022-03-08 DIAGNOSIS — I6381 Other cerebral infarction due to occlusion or stenosis of small artery: Principal | ICD-10-CM | POA: Diagnosis present

## 2022-03-08 DIAGNOSIS — E871 Hypo-osmolality and hyponatremia: Secondary | ICD-10-CM | POA: Diagnosis not present

## 2022-03-08 DIAGNOSIS — M1990 Unspecified osteoarthritis, unspecified site: Secondary | ICD-10-CM | POA: Diagnosis present

## 2022-03-08 DIAGNOSIS — B029 Zoster without complications: Secondary | ICD-10-CM | POA: Diagnosis present

## 2022-03-08 DIAGNOSIS — I959 Hypotension, unspecified: Secondary | ICD-10-CM | POA: Diagnosis not present

## 2022-03-08 DIAGNOSIS — K760 Fatty (change of) liver, not elsewhere classified: Secondary | ICD-10-CM | POA: Diagnosis present

## 2022-03-08 DIAGNOSIS — F418 Other specified anxiety disorders: Secondary | ICD-10-CM | POA: Diagnosis present

## 2022-03-08 DIAGNOSIS — K5900 Constipation, unspecified: Secondary | ICD-10-CM | POA: Diagnosis not present

## 2022-03-08 DIAGNOSIS — Z923 Personal history of irradiation: Secondary | ICD-10-CM

## 2022-03-08 DIAGNOSIS — F32A Depression, unspecified: Secondary | ICD-10-CM | POA: Diagnosis present

## 2022-03-08 DIAGNOSIS — I639 Cerebral infarction, unspecified: Secondary | ICD-10-CM

## 2022-03-08 DIAGNOSIS — R1084 Generalized abdominal pain: Secondary | ICD-10-CM

## 2022-03-08 DIAGNOSIS — E785 Hyperlipidemia, unspecified: Secondary | ICD-10-CM | POA: Diagnosis present

## 2022-03-08 DIAGNOSIS — E876 Hypokalemia: Secondary | ICD-10-CM | POA: Diagnosis not present

## 2022-03-08 DIAGNOSIS — F419 Anxiety disorder, unspecified: Secondary | ICD-10-CM | POA: Diagnosis present

## 2022-03-08 DIAGNOSIS — Z1152 Encounter for screening for COVID-19: Secondary | ICD-10-CM

## 2022-03-08 DIAGNOSIS — G629 Polyneuropathy, unspecified: Secondary | ICD-10-CM | POA: Diagnosis present

## 2022-03-08 DIAGNOSIS — Z79899 Other long term (current) drug therapy: Secondary | ICD-10-CM

## 2022-03-08 DIAGNOSIS — R109 Unspecified abdominal pain: Secondary | ICD-10-CM | POA: Insufficient documentation

## 2022-03-08 DIAGNOSIS — R338 Other retention of urine: Secondary | ICD-10-CM | POA: Diagnosis present

## 2022-03-08 DIAGNOSIS — B020 Zoster encephalitis: Secondary | ICD-10-CM

## 2022-03-08 DIAGNOSIS — R29810 Facial weakness: Secondary | ICD-10-CM | POA: Diagnosis present

## 2022-03-08 DIAGNOSIS — Z9221 Personal history of antineoplastic chemotherapy: Secondary | ICD-10-CM | POA: Diagnosis not present

## 2022-03-08 DIAGNOSIS — R29706 NIHSS score 6: Secondary | ICD-10-CM | POA: Diagnosis present

## 2022-03-08 DIAGNOSIS — Z853 Personal history of malignant neoplasm of breast: Secondary | ICD-10-CM | POA: Diagnosis not present

## 2022-03-08 DIAGNOSIS — B028 Zoster with other complications: Principal | ICD-10-CM

## 2022-03-08 DIAGNOSIS — Z888 Allergy status to other drugs, medicaments and biological substances status: Secondary | ICD-10-CM

## 2022-03-08 DIAGNOSIS — E860 Dehydration: Secondary | ICD-10-CM | POA: Diagnosis present

## 2022-03-08 HISTORY — PX: CYSTOSCOPY: SHX5120

## 2022-03-08 LAB — COMPREHENSIVE METABOLIC PANEL
ALT: 11 U/L (ref 0–44)
AST: 20 U/L (ref 15–41)
Albumin: 2.6 g/dL — ABNORMAL LOW (ref 3.5–5.0)
Alkaline Phosphatase: 64 U/L (ref 38–126)
Anion gap: 8 (ref 5–15)
BUN: 19 mg/dL (ref 8–23)
CO2: 21 mmol/L — ABNORMAL LOW (ref 22–32)
Calcium: 8.8 mg/dL — ABNORMAL LOW (ref 8.9–10.3)
Chloride: 103 mmol/L (ref 98–111)
Creatinine, Ser: 0.83 mg/dL (ref 0.44–1.00)
GFR, Estimated: >60 mL/min (ref >60)
Glucose, Bld: 114 mg/dL — ABNORMAL HIGH (ref 70–99)
Potassium: 3.2 mmol/L — ABNORMAL LOW (ref 3.5–5.1)
Sodium: 132 mmol/L — ABNORMAL LOW (ref 135–145)
Total Bilirubin: 0.8 mg/dL (ref 0.3–1.2)
Total Protein: 6.4 g/dL — ABNORMAL LOW (ref 6.5–8.1)

## 2022-03-08 LAB — URINALYSIS, COMPLETE (UACMP) WITH MICROSCOPIC
Bilirubin Urine: NEGATIVE
Glucose, UA: NEGATIVE mg/dL
Ketones, ur: NEGATIVE mg/dL
Nitrite: NEGATIVE
Protein, ur: NEGATIVE mg/dL
Specific Gravity, Urine: 1.029 (ref 1.005–1.030)
pH: 5 (ref 5.0–8.0)

## 2022-03-08 LAB — CBC WITH DIFFERENTIAL/PLATELET
Abs Immature Granulocytes: 0.07 10*3/uL (ref 0.00–0.07)
Basophils Absolute: 0 10*3/uL (ref 0.0–0.1)
Basophils Relative: 0 %
Eosinophils Absolute: 0.3 10*3/uL (ref 0.0–0.5)
Eosinophils Relative: 3 %
HCT: 32.5 % — ABNORMAL LOW (ref 36.0–46.0)
Hemoglobin: 10.3 g/dL — ABNORMAL LOW (ref 12.0–15.0)
Immature Granulocytes: 1 %
Lymphocytes Relative: 5 %
Lymphs Abs: 0.5 10*3/uL — ABNORMAL LOW (ref 0.7–4.0)
MCH: 29.3 pg (ref 26.0–34.0)
MCHC: 31.7 g/dL (ref 30.0–36.0)
MCV: 92.3 fL (ref 80.0–100.0)
Monocytes Absolute: 0.7 10*3/uL (ref 0.1–1.0)
Monocytes Relative: 7 %
Neutro Abs: 9 10*3/uL — ABNORMAL HIGH (ref 1.7–7.7)
Neutrophils Relative %: 84 %
Platelets: 230 10*3/uL (ref 150–400)
RBC: 3.52 MIL/uL — ABNORMAL LOW (ref 3.87–5.11)
RDW: 14 % (ref 11.5–15.5)
WBC: 10.6 10*3/uL — ABNORMAL HIGH (ref 4.0–10.5)
nRBC: 0 % (ref 0.0–0.2)

## 2022-03-08 LAB — LACTIC ACID, PLASMA
Lactic Acid, Venous: 2.2 mmol/L (ref 0.5–1.9)
Lactic Acid, Venous: 1.7 mmol/L (ref 0.5–1.9)

## 2022-03-08 LAB — MAGNESIUM: Magnesium: 1.6 mg/dL — ABNORMAL LOW (ref 1.7–2.4)

## 2022-03-08 LAB — APTT: aPTT: 28 seconds (ref 24–36)

## 2022-03-08 LAB — PROCALCITONIN: Procalcitonin: 0.16 ng/mL

## 2022-03-08 LAB — PROTIME-INR
INR: 1.2 (ref 0.8–1.2)
Prothrombin Time: 15.1 seconds (ref 11.4–15.2)

## 2022-03-08 LAB — AMMONIA: Ammonia: <10 umol/L (ref 9–35)

## 2022-03-08 SURGERY — CYSTOSCOPY
Anesthesia: General

## 2022-03-08 MED ORDER — PROPOFOL 10 MG/ML IV BOLUS
INTRAVENOUS | Status: AC
  Filled 2022-03-08: qty 20

## 2022-03-08 MED ORDER — SIMVASTATIN 20 MG PO TABS
40.0000 mg | ORAL_TABLET | Freq: Every day | ORAL | Status: DC
  Administered 2022-03-08: 40 mg via ORAL
  Filled 2022-03-08: qty 2

## 2022-03-08 MED ORDER — GABAPENTIN 100 MG PO CAPS
100.0000 mg | ORAL_CAPSULE | Freq: Three times a day (TID) | ORAL | Status: DC
  Administered 2022-03-08 – 2022-03-12 (×11): 100 mg via ORAL
  Filled 2022-03-08 (×11): qty 1

## 2022-03-08 MED ORDER — SODIUM CHLORIDE 0.9 % IV SOLN
1.0000 g | Freq: Once | INTRAVENOUS | Status: AC
  Administered 2022-03-08: 1 g via INTRAVENOUS
  Filled 2022-03-08: qty 10

## 2022-03-08 MED ORDER — GADOBUTROL 1 MMOL/ML IV SOLN
6.0000 mL | Freq: Once | INTRAVENOUS | Status: AC | PRN
  Administered 2022-03-09: 6 mL via INTRAVENOUS

## 2022-03-08 MED ORDER — IOHEXOL 300 MG/ML  SOLN
100.0000 mL | Freq: Once | INTRAMUSCULAR | Status: AC | PRN
  Administered 2022-03-08: 100 mL via INTRAVENOUS

## 2022-03-08 MED ORDER — PANTOPRAZOLE SODIUM 40 MG PO TBEC
40.0000 mg | DELAYED_RELEASE_TABLET | Freq: Every day | ORAL | Status: DC
  Administered 2022-03-08 – 2022-03-12 (×5): 40 mg via ORAL
  Filled 2022-03-08 (×5): qty 1

## 2022-03-08 MED ORDER — SODIUM CHLORIDE 0.9 % IV BOLUS
1000.0000 mL | Freq: Once | INTRAVENOUS | Status: AC
  Administered 2022-03-08: 1000 mL via INTRAVENOUS

## 2022-03-08 MED ORDER — DEXTROSE 5 % IV SOLN
10.0000 mg/kg | Freq: Two times a day (BID) | INTRAVENOUS | Status: DC
  Filled 2022-03-08: qty 12.4

## 2022-03-08 MED ORDER — POTASSIUM CHLORIDE CRYS ER 20 MEQ PO TBCR
40.0000 meq | EXTENDED_RELEASE_TABLET | Freq: Once | ORAL | Status: AC
  Administered 2022-03-08: 40 meq via ORAL
  Filled 2022-03-08: qty 2

## 2022-03-08 MED ORDER — ACETAMINOPHEN 500 MG PO TABS
1000.0000 mg | ORAL_TABLET | Freq: Once | ORAL | Status: AC
  Administered 2022-03-08: 1000 mg via ORAL
  Filled 2022-03-08: qty 2

## 2022-03-08 MED ORDER — DEXTROSE 5 % IV SOLN
10.0000 mg/kg | Freq: Three times a day (TID) | INTRAVENOUS | Status: DC
  Administered 2022-03-09 – 2022-03-12 (×11): 620 mg via INTRAVENOUS
  Filled 2022-03-08 (×13): qty 12.4

## 2022-03-08 MED ORDER — SODIUM CHLORIDE 0.9 % IV SOLN: INTRAVENOUS | Status: DC

## 2022-03-08 MED ORDER — ACETAMINOPHEN 325 MG PO TABS
650.0000 mg | ORAL_TABLET | Freq: Four times a day (QID) | ORAL | Status: DC | PRN
  Administered 2022-03-08 – 2022-03-12 (×8): 650 mg via ORAL
  Filled 2022-03-08 (×8): qty 2

## 2022-03-08 MED ORDER — PROPOFOL 10 MG/ML IV BOLUS
INTRAVENOUS | Status: DC | PRN
  Administered 2022-03-08: 30 mg via INTRAVENOUS
  Administered 2022-03-08 (×2): 20 mg via INTRAVENOUS
  Administered 2022-03-08: 40 mg via INTRAVENOUS
  Administered 2022-03-08: 10 mg via INTRAVENOUS
  Administered 2022-03-08: 20 mg via INTRAVENOUS

## 2022-03-08 MED ORDER — MAGNESIUM SULFATE 2 GM/50ML IV SOLN
2.0000 g | Freq: Once | INTRAVENOUS | Status: AC
  Administered 2022-03-08: 2 g via INTRAVENOUS
  Filled 2022-03-08: qty 50

## 2022-03-08 MED ORDER — FLUTICASONE PROPIONATE 50 MCG/ACT NA SUSP: 2.0000 | Freq: Two times a day (BID) | NASAL | Status: DC | PRN

## 2022-03-08 MED ORDER — ONDANSETRON HCL 4 MG/2ML IJ SOLN: 4.0000 mg | Freq: Three times a day (TID) | INTRAMUSCULAR | Status: DC | PRN

## 2022-03-08 SURGICAL SUPPLY — 9 items
BAG DRAIN SIEMENS DORNER NS (MISCELLANEOUS) ×1 IMPLANT
BAG URO DRAIN 4000ML (MISCELLANEOUS) IMPLANT
CATH FOLEY 2WAY SIL 16X30 (CATHETERS) IMPLANT
GOWN STRL REUS W/ TWL XL LVL3 (GOWN DISPOSABLE) ×1 IMPLANT
GOWN STRL REUS W/TWL XL LVL3 (GOWN DISPOSABLE) ×1
KIT TURNOVER CYSTO (KITS) ×1 IMPLANT
SURGILUBE 2OZ TUBE FLIPTOP (MISCELLANEOUS) ×1 IMPLANT
SYR 10ML LL (SYRINGE) ×1 IMPLANT
WATER STERILE IRR 500ML POUR (IV SOLUTION) ×1 IMPLANT

## 2022-03-08 NOTE — Anesthesia Postprocedure Evaluation (Signed)
Anesthesia Post Note  Patient: Raihana T Wescoat  Procedure(s) Performed: CYSTOSCOPY  Patient location during evaluation: Nursing Unit Anesthesia Type: General Level of consciousness: awake and alert Pain management: pain level controlled Vital Signs Assessment: post-procedure vital signs reviewed and stable Respiratory status: spontaneous breathing, nonlabored ventilation and respiratory function stable Cardiovascular status: blood pressure returned to baseline and stable Postop Assessment: no apparent nausea or vomiting Anesthetic complications: no   No notable events documented.   Last Vitals:  Vitals:   03/08/22 1646 03/08/22 2045  BP:  123/71  Pulse:  82  Resp:  17  Temp: 36.8 C   SpO2:  97%    Last Pain:  Vitals:   03/08/22 1646  TempSrc: Oral  PainSc:                  Precious Haws Codie Hainer

## 2022-03-08 NOTE — ED Provider Notes (Addendum)
[facility] Provider Note    Event Date/Time   First MD Initiated Contact with Patient 03/08/22 1224     (approximate)   History   No chief complaint on file.   HPI  Carly Peck is a 88 y.o. female past medical history significant for recent diagnosis of herpes zoster, who presents to the emergency department with altered mental status.  History is provided by the patient's daughter.  Patient was evaluated in the emergency department on Wednesday and diagnosed with a "bad case of herpes zoster" had an MRI of her spine at that time and was ultimately started on Valtrex and gabapentin.  States that she has been staying with her and has had progressively worsening altered mental status over the past 2 days.  Generalized weakness and near syncope yesterday and today.  Did have head injury yesterday.  Not on anticoagulation.  Complaining of abdominal pain and has not had a bowel movement in the past 1 week.  She was taking hydrocodone for pain control from her zoster but has not taken any hydrocodone in the past 3 to 4 days due to worsening confusion and concern for constipation.  Denies any fever.  When EMS arrived patient's blood pressure was 70 systolic.  Received 600 bolus with EMS of IV fluids     Physical Exam   Triage Vital Signs: ED Triage Vitals  Enc Vitals Group     BP 03/08/22 1231 (!) 102/48     Pulse Rate 03/08/22 1231 86     Resp 03/08/22 1231 (!) 21     Temp 03/08/22 1234 97.9 F (36.6 C)     Temp Source 03/08/22 1234 Oral     SpO2 03/08/22 1220 95 %     Weight --      Height --      Head Circumference --      Peak Flow --      Pain Score 03/08/22 1235 0     Pain Loc --      Pain Edu? --      Excl. in [place]? --     Most recent vital signs: Vitals:   03/08/22 1234 03/08/22 1300  BP:  (!) 103/48  Pulse:  86  Resp:  14  Temp: 97.9 F (36.6 C)   SpO2:  97%    Physical Exam Constitutional:      Appearance: She is well-developed.   HENT:     Head: Atraumatic.  Eyes:     Conjunctiva/sclera: Conjunctivae normal.  Cardiovascular:     Rate and Rhythm: Regular rhythm.  Pulmonary:     Effort: No respiratory distress.  Abdominal:     General: There is no distension.     Tenderness: There is abdominal tenderness.  Musculoskeletal:        General: Normal range of motion.     Cervical back: Normal range of motion.  Skin:    General: Skin is warm.     Findings: Rash present.     Comments: Zoster rash to the left lower back to the left inner groin without overlying erythema warmth or induration.  No purulence.  Neurological:     Mental Status: She is alert. She is disoriented.     Comments: Following commands.  Moving all extremities.     IMPRESSION / MDM / ASSESSMENT AND PLAN / ED COURSE  I reviewed the triage vital signs and the nursing notes.  Differential diagnosis including dehydration, sepsis, medication side  effect, zoster encephalitis  \ EKG  I, Nathaniel Man, the attending physician, personally viewed and interpreted this ECG.   Rate: Normal  Rhythm: Normal sinus  Axis: Normal  Intervals: Left bundle branch block  ST&T Change: None  No tachycardic or bradycardic dysrhythmias while on cardiac telemetry.  Compared to the last EKG patient did not have a left bundle branch block however compared to prior EKGs patient has had a left bundle branch block in the past  RADIOLOGY I independently reviewed imaging, my interpretation of imaging: CT head shows no signs of intracranial hemorrhage or infarction.  CT abdomen and pelvis without acute abnormalities.  Read as no acute findings.  LABS (all labs ordered are listed, but only abnormal results are displayed) Labs interpreted as -  Leukocytosis, lactic acid elevated at 2.2.  Potassium 3.2.  Mild hyponatremia  Labs Reviewed  COMPREHENSIVE METABOLIC PANEL - Abnormal; Notable for the following components:      Result Value   Sodium 132 (*)    Potassium  3.2 (*)    CO2 21 (*)    Glucose, Bld 114 (*)    Calcium 8.8 (*)    Total Protein 6.4 (*)    Albumin 2.6 (*)    All other components within normal limits  LACTIC ACID, PLASMA - Abnormal; Notable for the following components:   Lactic Acid, Venous 2.2 (*)    All other components within normal limits  CBC WITH DIFFERENTIAL/PLATELET - Abnormal; Notable for the following components:   WBC 10.6 (*)    RBC 3.52 (*)    Hemoglobin 10.3 (*)    HCT 32.5 (*)    Neutro Abs 9.0 (*)    Lymphs Abs 0.5 (*)    All other components within normal limits  CULTURE, BLOOD (ROUTINE X 2)  CULTURE, BLOOD (ROUTINE X 2)  URINE CULTURE  PROTIME-INR  LACTIC ACID, PLASMA  URINALYSIS, COMPLETE (UACMP) WITH MICROSCOPIC  Attempted in and out for urine however unable to in and out patient's urethra because it was too small.  TREATMENT   Blood cultures obtained.  Patient given 2 L of IV fluids.  Started on IV Rocephin to cover for possible urinary tract infection given delayed time of being able to obtain a UA.  Consulted hospitalist for admission for altered mental status -discussed lumbar puncture for possible zoster encephalitis however concern that lumbar puncture would need to be done through her active zoster rash.  Given that the patient is 53 discussed with the hospitalist fluoro-guided lumbar puncture versus an MRI.  Will hold off on valacyclovir until the hospitalist has evaluated the patient.   PROCEDURES:  Critical Care performed: yes  .Critical Care  Performed by: Nathaniel Man, MD Authorized by: Nathaniel Man, MD   Critical care provider statement:    Critical care time (minutes):  30   Critical care time was exclusive of:  Separately billable procedures and treating other patients   Critical care was necessary to treat or prevent imminent or life-threatening deterioration of the following conditions:  CNS failure or compromise and sepsis   Critical care was time spent personally by me on the  following activities:  Development of treatment plan with patient or surrogate, discussions with consultants, evaluation of patient's response to treatment, examination of patient, ordering and review of laboratory studies, ordering and review of radiographic studies, ordering and performing treatments and interventions, pulse oximetry, re-evaluation of patient's condition and review of old charts   Patient's presentation is most consistent with acute  presentation with potential threat to life or bodily function.   MEDICATIONS ORDERED IN ED: Medications  cefTRIAXone (ROCEPHIN) 1 g in sodium chloride 0.9 % 100 mL IVPB (1 g Intravenous New Bag/Given 03/08/22 1442)  iohexol (OMNIPAQUE) 300 MG/ML solution 100 mL (100 mLs Intravenous Contrast Given 03/08/22 1321)  sodium chloride 0.9 % bolus 1,000 mL (0 mLs Intravenous Stopped 03/08/22 1432)  sodium chloride 0.9 % bolus 1,000 mL (1,000 mLs Intravenous New Bag/Given 03/08/22 1441)  sodium chloride 0.9 % bolus 1,000 mL (1,000 mLs Intravenous New Bag/Given 03/08/22 1441)  acetaminophen (TYLENOL) tablet 1,000 mg (1,000 mg Oral Given 03/08/22 1441)    FINAL CLINICAL IMPRESSION(S) / ED DIAGNOSES   Final diagnoses:  Herpes zoster with other complication  Altered mental status, unspecified altered mental status type     Rx / DC Orders   ED Discharge Orders     None        Note:  This document was prepared using Dragon voice recognition software and may include unintentional dictation errors.   Nathaniel Man, MD 03/08/22 1442    Nathaniel Man, MD 03/08/22 1459

## 2022-03-08 NOTE — Consult Note (Signed)
Pharmacy Antibiotic Note  Carly Peck is a 88 y.o. female admitted on 03/08/2022 with acute metabolic encephalopathy. Patient was evaluated in the emergency department on 03/03/22 and diagnosed with a "bad case of herpes zoster" had an MRI of her spine at that time and was ultimately started on Valtrex and gabapentin. States that she has been staying with her and has had progressively worsening altered mental status over the past 2 days  Pharmacy has been consulted for acyclovir dosing.  Plan: Initiate acyclovir 10 mg/kg (620 mg) Q12H Initiate NS @ 125 mL/hr BMP daily x 3 days     Temp (24hrs), Avg:97.9 F (36.6 C), Min:97.9 F (36.6 C), Max:97.9 F (36.6 C)  Recent Labs  Lab 03/08/22 1227 03/08/22 1239  WBC 10.6*  --   CREATININE 0.83  --   LATICACIDVEN  --  2.2*    Estimated Creatinine Clearance: 40.5 mL/min (by C-G formula based on SCr of 0.83 mg/dL).    Allergies  Allergen Reactions   Adhesive [Tape] Other (See Comments)    Red mark, Please use "paper" tape    Antimicrobials this admission: 12/19 acyclovir >>   Dose adjustments this admission:   Microbiology results: 12/19 BCx: sent 12/19 UCx: sent    Thank you for allowing pharmacy to be a part of this patient's care.  Dorothe Pea, PharmD, BCPS Clinical Pharmacist   03/08/2022 3:29 PM

## 2022-03-08 NOTE — H&P (Addendum)
History and Physical    Carly Peck JQB:[id number] DOB: 12-13-1931 DOA: 03/08/2022  Referring MD/NP/PA:   PCP: Kirk Ruths, MD   Patient coming from:  The patient is coming from home.     Chief Complaint: AMS and syncope  HPI: Carly Peck is a 88 y.o. female with medical history significant of hypertension, hyperlipidemia, depression with anxiety, left bundle blockade, breast cancer (s/p of radiation and chemotherapy), MGUS, tic [place], currently being treated for herpes zoster, who presents with altered mental status and syncope.  Per patient's daughter at the bedside, patient started having left-sided flank/back pain 2 weeks ago, initial workup was negative, then found to have rash breakout, therefore diagnosed as herpes zoster.  Patient was sdarted on Valtrex on Friday.  Patient was also started on Norco for pain control, but did not take Norco in the past 2 days.  In this morning, patient was found to be confused and had syncope episode.  Patient seems to have had left facial droop and left arm dropping down.  Patient has mild dry cough, no chest pain or shortness breath.  Patient does not have nausea vomiting, diarrhea or abdominal pain.  But on examination by ED physician, patient seems to have abdominal tenderness.  She denies abdominal pain to me.  Patient has burning on urination since last night.  No fever or chills. When I saw patient in the ED, patient is lethargic, but oriented x 3.  She moves all extremities.  No facial droop or slurred speech. Per report, patient had hypotension with blood pressure 70/48 which improved to 103/48 in the ED.  Patient received 600 cc normal saline by EMS and 2 L normal saline at the ED.  In ED, Found to have acute urinary retention with 700 cc urine in bladder.  Nurse attempted to put Foley catheter without success.  Consulted Dr. Bernardo Heater of urology for Foley catheter placement.  Data reviewed independently and ED Course: pt was  found to have WBC 10.6, lactic acid 2.2, INR 1.2, potassium 3.2, GFR> 60, temperature normal, blood pressure 103/48, heart rate 86, RR 21, oxygen saturation 98% on room air.  Chest x-ray negative.  CT of head is negative for acute intracranial abnormalities.  CT of abdomen/pelvis is negative for acute intra-abdominal issues, but showed hepatic steatosis.  Patient is admitted to telemetry bed as inpatient. Consulted Dr. Bernardo Heater of urology.   EKG: I have personally reviewed.  Sinus rhythm, QTc 489, left bundle blockade, poor R wave progression, T wave inversion in V5-V6.   Review of Systems:   General: no fevers, chills, no body weight gain,  has fatigue HEENT: no blurry vision, hearing changes or sore throat Respiratory: no dyspnea, coughing, wheezing CV: no chest pain, no palpitations GI: no nausea, vomiting, abdominal pain, diarrhea, constipation GU: no dysuria, burning on urination, increased urinary frequency, hematuria  Ext: no leg edema Neuro: no unilateral weakness, numbness, or tingling, no vision change or hearing loss. Has confusion and syncope Skin: has rash MSK: No muscle spasm, no deformity, no limitation of range of movement in spin Heme: No easy bruising.  Travel history: No recent long distant travel.   Allergy:  Allergies  Allergen Reactions   Adhesive [Tape] Other (See Comments)    Red mark, Please use "paper" tape    Past Medical History:  Diagnosis Date   Anxiety    Arthritis    Breast cancer ([place]) 1997   left breast cancer   Cancer ([place])  Environmental and seasonal allergies    GERD (gastroesophageal reflux disease)    History of left breast cancer    Hyperlipidemia    Hypertension    Left bundle branch block (LBBB)    Neuropathy    Personal history of chemotherapy 1997   Left breast   Personal history of radiation therapy 1997   Left breast    Past Surgical History:  Procedure Laterality Date   BACK SURGERY     BREAST SURGERY     CARDIAC  CATHETERIZATION     DILATION AND CURETTAGE OF UTERUS     EYE SURGERY Bilateral    Cataract Extraction with IOL   KNEE ARTHROPLASTY Right 09/14/2015   Procedure: COMPUTER ASSISTED TOTAL KNEE ARTHROPLASTY;  Surgeon: Dereck Leep, MD;  Location: [facility] ORS;  Service: Orthopedics;  Laterality: Right;   KNEE ARTHROPLASTY Left 05/30/2016   Procedure: COMPUTER ASSISTED TOTAL KNEE ARTHROPLASTY;  Surgeon: Dereck Leep, MD;  Location: [facility] ORS;  Service: Orthopedics;  Laterality: Left;   KYPHOPLASTY  2016   Dr. Rudene Christians, [place] Left 1997   chemo rad mastectomy   TONSILLECTOMY      Social History:  reports that she has never smoked. She has never used smokeless tobacco. She reports current alcohol use of about 1.0 standard drink of alcohol per week. She reports that she does not use drugs.  Family History:  Family History  Problem Relation Age of Onset   Breast cancer Neg Hx      Prior to Admission medications   Medication Sig Start Date End Date Taking? Authorizing Provider  baclofen (LIORESAL) 10 MG tablet Take 5 mg by mouth 3 (three) times daily. 03/01/22 03/31/22 Yes [provider]  carvedilol (COREG) 12.5 MG tablet Take 12.5 mg by mouth 2 (two) times daily with a meal.   Yes [provider]  cetirizine (ZYRTEC) 10 MG tablet Take 10 mg by mouth daily.   Yes [provider]  Cholecalciferol (VITAMIN D3) 2000 units TABS Take 2,000 Units by mouth daily.   Yes [provider]  Cyanocobalamin (VITAMIN B-12) 1000 MCG SUBL Place under the tongue.   Yes [provider]  DULoxetine (CYMBALTA) 30 MG capsule Take 30 mg by mouth daily. 11/22/21  Yes [provider]  gabapentin (NEURONTIN) 100 MG capsule Take 1 capsule (100 mg total) by mouth 3 (three) times daily. 03/04/22 03/04/23 Yes Vladimir Crofts, MD  losartan (COZAAR) 50 MG tablet Take 50 mg by mouth daily.   Yes [provider]  omeprazole (PRILOSEC) 20 MG capsule Take 20 mg by  mouth daily.   Yes [provider]  sennosides-docusate sodium (SENOKOT-S) 8.6-50 MG tablet Take 4 tablets by mouth at bedtime.    Yes [provider]  simvastatin (ZOCOR) 40 MG tablet Take 40 mg by mouth at bedtime.   Yes [provider]  spironolactone (ALDACTONE) 25 MG tablet Take 1 tablet by mouth daily. 10/17/19 03/08/22 Yes [provider]  valACYclovir (VALTREX) 1000 MG tablet Take 1 tablet (1,000 mg total) by mouth 2 (two) times daily for 7 days. 03/04/22 03/11/22 Yes Vladimir Crofts, MD  acetaminophen (TYLENOL) 500 MG tablet Take 1,000 mg by mouth 2 (two) times daily as needed for mild pain or headache.     [provider]  Azelastine HCl 137 MCG/SPRAY SOLN SMARTSIG:1 Spray(s) Both Nares Every 12 Hours PRN 08/05/21   [provider]  clobetasol ointment (TEMOVATE) 0.05 % APPLY TO AFFECTED AREA DAILY AS  NEEDED for rash on buttocks 01/21/15   [provider]  fluticasone (FLONASE) 50 MCG/ACT nasal spray Place 2 sprays into both nostrils 2 (two) times daily as needed for rhinitis.    [provider]  gabapentin (NEURONTIN) 300 MG capsule Take 300 mg by mouth 3 (three) times daily. Patient not taking: Reported on 03/08/2022 07/05/19   [provider]    Physical Exam: Vitals:   03/08/22 1234 03/08/22 1300 03/08/22 1445 03/08/22 1646  BP:  (!) 103/48 129/62   Pulse:  86 94   Resp:  14 15   Temp: 97.9 F (36.6 C)   98.2 F (36.8 C)  TempSrc: Oral   Oral  SpO2:  97% 100%    General: Not in acute distress HEENT:       Eyes: PERRL, EOMI, no scleral icterus.       ENT: No discharge from the ears and nose, no pharynx injection, no tonsillar enlargement.        Neck: No JVD, no bruit, no mass felt. Heme: No neck lymph node enlargement. Cardiac: S1/S2, RRR, No murmurs, No gallops or rubs. Respiratory: No rales, wheezing, rhonchi or rubs. GI: Soft, nondistended, nontender, no rebound pain, no organomegaly, BS  present. GU: Has urinary retention Ext: No pitting leg edema bilaterally. 1+DP/PT pulse bilaterally. Musculoskeletal: No joint deformities, No joint redness or warmth, no limitation of ROM in spin. Skin: has scabbed shingle rashes in left flank area, extending to left groin area Neuro: Lethargic, oriented X3, cranial nerves II-XII grossly intact, moves all extremities normally.  Psych: Patient is not psychotic, no suicidal or hemocidal ideation.  Labs on Admission: I have personally reviewed following labs and imaging studies  CBC: Recent Labs  Lab 03/08/22 1227  WBC 10.6*  NEUTROABS 9.0*  HGB 10.3*  HCT 32.5*  MCV 92.3  PLT 622   Basic Metabolic Panel: Recent Labs  Lab 03/08/22 1227 03/08/22 1239  NA 132*  --   K 3.2*  --   CL 103  --   CO2 21*  --   GLUCOSE 114*  --   BUN 19  --   CREATININE 0.83  --   CALCIUM 8.8*  --   MG  --  1.6*   GFR: Estimated Creatinine Clearance: 40.5 mL/min (by C-G formula based on SCr of 0.83 mg/dL). Liver Function Tests: Recent Labs  Lab 03/08/22 1227  AST 20  ALT 11  ALKPHOS 64  BILITOT 0.8  PROT 6.4*  ALBUMIN 2.6*   No results for input(s): "LIPASE", "AMYLASE" in the last 168 hours. No results for input(s): "AMMONIA" in the last 168 hours. Coagulation Profile: Recent Labs  Lab 03/08/22 1227  INR 1.2   Cardiac Enzymes: No results for input(s): "CKTOTAL", "CKMB", "CKMBINDEX", "TROPONINI" in the last 168 hours. BNP (last 3 results) No results for input(s): "PROBNP" in the last 8760 hours. HbA1C: No results for input(s): "HGBA1C" in the last 72 hours. CBG: No results for input(s): "GLUCAP" in the last 168 hours. Lipid Profile: No results for input(s): "CHOL", "HDL", "LDLCALC", "TRIG", "CHOLHDL", "LDLDIRECT" in the last 72 hours. Thyroid Function Tests: No results for input(s): "TSH", "T4TOTAL", "FREET4", "T3FREE", "THYROIDAB" in the last 72 hours. Anemia Panel: No results for input(s): "VITAMINB12", "FOLATE",  "FERRITIN", "TIBC", "IRON", "RETICCTPCT" in the last 72 hours. Urine analysis:    Component Value Date/Time   COLORURINE YELLOW (A) 02/26/2022 0955   APPEARANCEUR CLEAR (A) 02/26/2022 0955   APPEARANCEUR Clear 02/02/2014 2238   LABSPEC 1.011  02/26/2022 0955   LABSPEC 1.017 02/02/2014 2238   PHURINE 7.0 02/26/2022 [place] 02/26/2022 0955   GLUCOSEU Negative 02/02/2014 2238   HGBUR NEGATIVE 02/26/2022 [place] 02/26/2022 0955   BILIRUBINUR Negative 02/02/2014 2238   KETONESUR NEGATIVE 02/26/2022 0955   PROTEINUR NEGATIVE 02/26/2022 0955   NITRITE NEGATIVE 02/26/2022 0955   LEUKOCYTESUR NEGATIVE 02/26/2022 0955   LEUKOCYTESUR Trace 02/02/2014 2238   Sepsis Labs: '@LABRCNTIP'$ (procalcitonin:4,lacticidven:4) )No results found for this or any previous visit (from the past 240 hour(s)).   Radiological Exams on Admission: CT Head Wo Contrast  Result Date: 03/08/2022 CLINICAL DATA:  Altered mental status. Syncopal episode during which the patient lower herself to the ground without falling. EXAM: CT HEAD WITHOUT CONTRAST TECHNIQUE: Contiguous axial images were obtained from the base of the skull through the vertex without intravenous contrast. RADIATION DOSE REDUCTION: This exam was performed according to the departmental dose-optimization program which includes automated exposure control, adjustment of the mA and/or kV according to patient size and/or use of iterative reconstruction technique. COMPARISON:  02/02/2014 FINDINGS: Brain: No significant change in mild-to-moderate enlargement of the ventricles and subarachnoid spaces. Moderate patchy white matter low density in both cerebral hemispheres with significant progression. No intracranial hemorrhage, mass lesion or CT evidence of acute infarction. Vascular: No hyperdense vessel or unexpected calcification. Skull: Normal. Negative for fracture or focal lesion. Sinuses/Orbits: Status post bilateral cataract  extraction. Unremarkable bones and included paranasal sinuses. Other: None. IMPRESSION: 1. No acute abnormality. 2. Stable mild-to-moderate diffuse cerebral and cerebellar atrophy. 3. Moderate chronic small vessel white matter ischemic changes in both cerebral hemispheres with significant progression since 2015. Electronically Signed   By: Claudie Revering M.D.   On: 03/08/2022 13:51   CT ABDOMEN PELVIS W CONTRAST  Result Date: 03/08/2022 CLINICAL DATA:  Acute, non localized epigastric abdominal pain. Syncopal episode during which the patient lower herself to the ground without falling. EXAM: CT ABDOMEN AND PELVIS WITH CONTRAST TECHNIQUE: Multidetector CT imaging of the abdomen and pelvis was performed using the standard protocol following bolus administration of intravenous contrast. RADIATION DOSE REDUCTION: This exam was performed according to the departmental dose-optimization program which includes automated exposure control, adjustment of the mA and/or kV according to patient size and/or use of iterative reconstruction technique. CONTRAST:  157m OMNIPAQUE IOHEXOL 300 MG/ML  SOLN COMPARISON:  02/26/2022 FINDINGS: Lower chest: Normal sized heart. Mild atelectasis and/or linear scarring at both lung bases. Hepatobiliary: Diffuse low density of the liver. Distended gallbladder without visible gallstones, wall thickening or pericholecystic fluid. No biliary ductal dilatation. Pancreas: Moderate to marked diffuse pancreatic atrophy. Spleen: Normal in size without focal abnormality. Adrenals/Urinary Tract: Normal-appearing adrenal glands. Small bilateral renal cysts. These do not need imaging follow-up. Unremarkable ureters and urinary bladder. Stomach/Bowel: Stomach is within normal limits. Appendix appears normal. No evidence of bowel wall thickening, distention, or inflammatory changes. Vascular/Lymphatic: Atheromatous arterial calcifications without aneurysm. No enlarged lymph nodes. Reproductive: Uterus and  bilateral adnexa are unremarkable. Other: No abdominal wall hernia or abnormality. No abdominopelvic ascites. Musculoskeletal: Lumbar and lower thoracic spine degenerative changes. Stable L1 vertebral body compression deformity and kyphoplasty material and mild L2 superior endplate compression deformity with sclerosis. No acute fracture lines seen. Stable grade 1 retrolisthesis at the L1-2 and L2-3 levels and grade 1 anterolisthesis at the L3-4 and L4-5 levels. Stable minimal retrolisthesis at the L5-S1 level. No pars defects seen. No acute fractures. IMPRESSION: 1. No acute abnormality. 2. Diffuse hepatic steatosis. Electronically Signed  By: Claudie Revering M.D.   On: 03/08/2022 13:48   DG Chest Port 1 View  Result Date: 03/08/2022 CLINICAL DATA:  Sepsis EXAM: PORTABLE CHEST 1 VIEW COMPARISON:  05/09/2020 FINDINGS: Heart and mediastinal contours are within normal limits. No focal opacities or effusions. No acute bony abnormality. Surgical clips in the left axilla. Degenerative changes in the shoulders. IMPRESSION: No active cardiopulmonary disease. Electronically Signed   By: Rolm Baptise M.D.   On: 03/08/2022 13:11      Assessment/Plan Principal Problem:   Acute metabolic encephalopathy Active Problems:   Syncope   Herpes zoster   Hypotension   Benign essential HTN   HLD (hyperlipidemia)   Depression with anxiety   Hypokalemia   Hypomagnesemia   Elevated lactic acid level   Acute urinary retention   Assessment and Plan:  Acute metabolic encephalopathy and syncope: Patient reportedly had hypotension which is likely the etiology for syncope.  Etiology for altered mental status is not completely clear.  Potential differential diagnosis include UTI, stroke and herpes zoster encephalitis.  Patient has several sedative medications on list, including baclofen, Zetia, Cymbalta, Neurontin, hydrocodone. Per her daughter, patient is only taking Neurontin currently, it is less likely for patient to  have polypharmacy.  Since patient has urinary retention, nurse attempted to place Foley catheter without success.  Urine sample has not been collected yet.  Patient received 1 dose of Rocephin empirically.  Will need to follow-up urinalysis after Foley catheter is placed daily.  -Admitted to telemetry bed as inpatient -Start IV acyclovir empirically -Get MRI for brain -Follow-up urinalysis and urine culture -IV fluid: 2 L normal saline, then 125 cc/h -Follow-up blood culture -If urinalysis negative and MRI of the brain is negative, may need to consider lumbar puncture.  Herpes zoster -IV acyclovir as above -As needed Tylenol for pain -Continue Neurontin  Hypotension: Possibly due to dehydration and continuation of her blood pressure medications -Hold Cozaar, spironolactone, Coreg -IV fluid as above  Benign essential HTN -Hold all blood pressure medications  HLD (hyperlipidemia) -Cozaar  Depression with anxiety: Patient's not taking her Cymbalta currently -Observe closely  Hypokalemia and Hypomagnesemia:  Potassium 3.2 and Mg 1.6 -Repleted potassium and magnesium  Elevated lactic acid level: Lactic acid 2.2, patient does not meets criteria for sepsis -IV fluid as above  Acute urinary retention -Consulted Dr. Bernardo Heater of urology for Foley catheter placement    DVT ppx: SCD  Code Status: Full code  Family Communication:  Yes, patient's husband and daughter   at bed side.  Disposition Plan:  Anticipate discharge back to previous environment  Consults called:   Consulted Dr. Bernardo Heater of urology.  Admission status and Level of care: Telemetry Medical:     as inpt       Dispo: The patient is from: Home              Anticipated d/c is to: Home              Anticipated d/c date is: 2 days              Patient currently is not medically stable to d/c.    Severity of Illness:  The appropriate patient status for this patient is INPATIENT. Inpatient status is judged to  be reasonable and necessary in order to provide the required intensity of service to ensure the patient's safety. The patient's presenting symptoms, physical exam findings, and initial radiographic and laboratory data in the context of their chronic comorbidities is  felt to place them at high risk for further clinical deterioration. Furthermore, it is not anticipated that the patient will be medically stable for discharge from the hospital within 2 midnights of admission.   * I certify that at the point of admission it is my clinical judgment that the patient will require inpatient hospital care spanning beyond 2 midnights from the point of admission due to high intensity of service, high risk for further deterioration and high frequency of surveillance required.*       Date of Service 03/08/2022    [facility]   If 7PM-7AM, please contact night-coverage [email or web address] 03/08/2022, 6:48 PM

## 2022-03-08 NOTE — ED Notes (Signed)
Multiple cath attempts made with nursing staff. RN notified provider.

## 2022-03-08 NOTE — Op Note (Signed)
   Preoperative diagnosis:  Urinary retention  Postoperative diagnosis:  Urinary retention  Procedure: Foley catheter placement  Surgeon: Abbie Sons, MD  Anesthesia: MAC  Complications: None  Intraoperative findings:  Small vaginal introitus.  Meatus visualized with positioning/sedation  EBL: None  Specimens: None  Indication: Carly Peck is a 88 y.o. female admitted with altered mental status and sepsis.  Diagnosed herpes zoster earlier this month.  Found to be in urinary retention in ED and Foley catheter unable to be placed secondary to small vaginal introitus and uncooperative patient.  Bladder scan with volume >1 L.  For catheter placement under sedation informed consent has been obtained.  Description of procedure:  The patient was taken to the operating room and was placed in the dorsal lithotomy position.  IV sedation was obtained by anesthesia.  Vaginal introitus was prepped with Betadine.  A preoperative time-out was performed.   Small vaginal introitus which would barely admit the index finger.  Using lateral labial retraction with an assistant the meatus was able to be visualized and located posteriorly.  A 16 French catheter was placed without difficulty with return of clear urine.  The balloon was inflated with 10 mL of sterile water and placed to gravity drainage.  She was transported to her room in stable condition.  Recommendation: Indwelling catheter x 10-14 days   Abbie Sons, M.D.

## 2022-03-08 NOTE — Consult Note (Signed)
Pharmacy Antibiotic Note  Carly Peck is a 88 y.o. female admitted on 03/08/2022 with acute metabolic encephalopathy. Patient was evaluated in the emergency department on 03/03/22 and diagnosed with a "bad case of herpes zoster" had an MRI of her spine at that time and was ultimately started on Valtrex and gabapentin. States that she has been staying with her and has had progressively worsening altered mental status over the past 2 days  Pharmacy has been consulted for acyclovir dosing.  Plan: Initiate acyclovir 10 mg/kg (620 mg) Q8H (Normalized CrCl > 50) Initiate NS @ 125 mL/hr BMP daily x 3 days     Temp (24hrs), Avg:97.9 F (36.6 C), Min:97.9 F (36.6 C), Max:97.9 F (36.6 C)  Recent Labs  Lab 03/08/22 1227 03/08/22 1239  WBC 10.6*  --   CREATININE 0.83  --   LATICACIDVEN  --  2.2*     Estimated Creatinine Clearance: 40.5 mL/min (by C-G formula based on SCr of 0.83 mg/dL).    Allergies  Allergen Reactions   Adhesive [Tape] Other (See Comments)    Red mark, Please use "paper" tape    Antimicrobials this admission: 12/19 acyclovir >>   Dose adjustments this admission:   Microbiology results: 12/19 BCx: sent 12/19 UCx: sent    Thank you for allowing pharmacy to be a part of this patient's care.  Dorothe Pea, PharmD, BCPS Clinical Pharmacist   03/08/2022 4:42 PM

## 2022-03-08 NOTE — Transfer of Care (Addendum)
Immediate Anesthesia Transfer of Care Note  Patient: Carly Peck  Procedure(s) Performed: CYSTOSCOPY  Patient Location: Nursing Unit  Anesthesia Type:General  Level of Consciousness: awake and alert   Airway & Oxygen Therapy: Patient Spontanous Breathing  Post-op Assessment: Report given to RN and Post -op Vital signs reviewed and stable  Post vital signs: Reviewed and stable  Last Vitals:  Vitals Value Taken Time  BP 123/71 03/08/22 2045  Temp    Pulse 82 03/08/22 2045  Resp 17 03/08/22 2045  SpO2 97 % 03/08/22 2045    Last Pain:  Vitals:   03/08/22 1646  TempSrc: Oral  PainSc:          Complications: No notable events documented.

## 2022-03-08 NOTE — ED Notes (Signed)
Urology at bedside attempted to place foley cath. Provider unable to do so. Provider sts to use pure wick. RN bladder scanned pt bladder, pt has greater than 959m in bladder at this time.

## 2022-03-08 NOTE — Consult Note (Signed)
Urology Consult  Requesting physician: Ivor Costa, MD  Reason for consultation: Urinary retention with inability to place Foley catheter  History of Present Illness: Carly Peck is a 88 y.o. female presented to ED today with a 3-day history of worsening mental status.  Admitted with working diagnosis of sepsis.  She was found to be in urinary retention with bladder volume of greater than 700 mL.  ED staff unable to place Foley catheter secondary to inability to visualize the urethral meatus.  Seen in the ED earlier this month and diagnosed with herpes zoster in the flank region.  Complaining of bladder fullness with inability to void  Denies prior history of urologic problems or urinary retention  Past Medical History:  Diagnosis Date   Anxiety    Arthritis    Breast cancer ([place]) 1997   left breast cancer   Cancer ([place])    Environmental and seasonal allergies    GERD (gastroesophageal reflux disease)    History of left breast cancer    Hyperlipidemia    Hypertension    Left bundle branch block (LBBB)    Neuropathy    Personal history of chemotherapy 1997   Left breast   Personal history of radiation therapy 1997   Left breast    Past Surgical History:  Procedure Laterality Date   BACK SURGERY     BREAST SURGERY     CARDIAC CATHETERIZATION     DILATION AND CURETTAGE OF UTERUS     EYE SURGERY Bilateral    Cataract Extraction with IOL   KNEE ARTHROPLASTY Right 09/14/2015   Procedure: COMPUTER ASSISTED TOTAL KNEE ARTHROPLASTY;  Surgeon: Dereck Leep, MD;  Location: [facility] ORS;  Service: Orthopedics;  Laterality: Right;   KNEE ARTHROPLASTY Left 05/30/2016   Procedure: COMPUTER ASSISTED TOTAL KNEE ARTHROPLASTY;  Surgeon: Dereck Leep, MD;  Location: [facility] ORS;  Service: Orthopedics;  Laterality: Left;   KYPHOPLASTY  2016   Dr. Rudene Christians, [place] Left 1997   chemo rad mastectomy   TONSILLECTOMY      Home Medications:  Current Meds  Medication Sig   baclofen  (LIORESAL) 10 MG tablet Take 5 mg by mouth 3 (three) times daily.   carvedilol (COREG) 12.5 MG tablet Take 12.5 mg by mouth 2 (two) times daily with a meal.   cetirizine (ZYRTEC) 10 MG tablet Take 10 mg by mouth daily.   Cholecalciferol (VITAMIN D3) 2000 units TABS Take 2,000 Units by mouth daily.   Cyanocobalamin (VITAMIN B-12) 1000 MCG SUBL Place under the tongue.   DULoxetine (CYMBALTA) 30 MG capsule Take 30 mg by mouth daily.   gabapentin (NEURONTIN) 100 MG capsule Take 1 capsule (100 mg total) by mouth 3 (three) times daily.   losartan (COZAAR) 50 MG tablet Take 50 mg by mouth daily.   omeprazole (PRILOSEC) 20 MG capsule Take 20 mg by mouth daily.   sennosides-docusate sodium (SENOKOT-S) 8.6-50 MG tablet Take 4 tablets by mouth at bedtime.    simvastatin (ZOCOR) 40 MG tablet Take 40 mg by mouth at bedtime.   spironolactone (ALDACTONE) 25 MG tablet Take 1 tablet by mouth daily.   valACYclovir (VALTREX) 1000 MG tablet Take 1 tablet (1,000 mg total) by mouth 2 (two) times daily for 7 days.    Allergies:  Allergies  Allergen Reactions   Adhesive [Tape] Other (See Comments)    Red mark, Please use "paper" tape    Family History  Problem Relation Age of Onset   Breast  cancer Neg Hx     Social History:  reports that she has never smoked. She has never used smokeless tobacco. She reports current alcohol use of about 1.0 standard drink of alcohol per week. She reports that she does not use drugs.  ROS: A complete review of systems was performed.  All systems are negative except for pertinent findings as noted.  Physical Exam:  Vital signs in last 24 hours: Temp:  [97.9 F (36.6 C)-98.2 F (36.8 C)] 98.2 F (36.8 C) (12/19 1646) Pulse Rate:  [86-94] 94 (12/19 1445) Resp:  [14-21] 15 (12/19 1445) BP: (102-129)/(48-62) 129/62 (12/19 1445) SpO2:  [95 %-100 %] 100 % (12/19 1445) Constitutional:  Alert, No acute distress HEENT: [place] AT, moist mucus membranes.  Trachea midline, no  masses Cardiovascular: Regular rate and rhythm Respiratory: Normal respiratory effort, lungs clear bilaterally GI: Abdomen is soft, nontender, nondistended, no abdominal masses GU: Very small vaginal introitus.  Unable to get an adequate exam due to patient discomfort.  The urethral meatus was not visualized.  Several blind attempts at catheter placement were not successful Lymph: No inguinal adenopathy Neurologic: Grossly intact, no focal deficits, moving all 4 extremities   Laboratory Data:  Recent Labs    03/08/22 1227  WBC 10.6*  HGB 10.3*  HCT 32.5*   Recent Labs    03/08/22 1227  NA 132*  K 3.2*  CL 103  CO2 21*  GLUCOSE 114*  BUN 19  CREATININE 0.83  CALCIUM 8.8*   Recent Labs    03/08/22 1227  INR 1.2     Radiologic Imaging: CT abdomen/pelvis reviewed and compared with prior CT early December 2023.  Bladder distended on today CT and no distention on prior scan  CT Head Wo Contrast  Result Date: 03/08/2022 CLINICAL DATA:  Altered mental status. Syncopal episode during which the patient lower herself to the ground without falling. EXAM: CT HEAD WITHOUT CONTRAST TECHNIQUE: Contiguous axial images were obtained from the base of the skull through the vertex without intravenous contrast. RADIATION DOSE REDUCTION: This exam was performed according to the departmental dose-optimization program which includes automated exposure control, adjustment of the mA and/or kV according to patient size and/or use of iterative reconstruction technique. COMPARISON:  02/02/2014 FINDINGS: Brain: No significant change in mild-to-moderate enlargement of the ventricles and subarachnoid spaces. Moderate patchy white matter low density in both cerebral hemispheres with significant progression. No intracranial hemorrhage, mass lesion or CT evidence of acute infarction. Vascular: No hyperdense vessel or unexpected calcification. Skull: Normal. Negative for fracture or focal lesion.  Sinuses/Orbits: Status post bilateral cataract extraction. Unremarkable bones and included paranasal sinuses. Other: None. IMPRESSION: 1. No acute abnormality. 2. Stable mild-to-moderate diffuse cerebral and cerebellar atrophy. 3. Moderate chronic small vessel white matter ischemic changes in both cerebral hemispheres with significant progression since 2015. Electronically Signed   By: Claudie Revering M.D.   On: 03/08/2022 13:51   CT ABDOMEN PELVIS W CONTRAST  Result Date: 03/08/2022 CLINICAL DATA:  Acute, non localized epigastric abdominal pain. Syncopal episode during which the patient lower herself to the ground without falling. EXAM: CT ABDOMEN AND PELVIS WITH CONTRAST TECHNIQUE: Multidetector CT imaging of the abdomen and pelvis was performed using the standard protocol following bolus administration of intravenous contrast. RADIATION DOSE REDUCTION: This exam was performed according to the departmental dose-optimization program which includes automated exposure control, adjustment of the mA and/or kV according to patient size and/or use of iterative reconstruction technique. CONTRAST:  158m OMNIPAQUE IOHEXOL 300 MG/ML  SOLN COMPARISON:  02/26/2022 FINDINGS: Lower chest: Normal sized heart. Mild atelectasis and/or linear scarring at both lung bases. Hepatobiliary: Diffuse low density of the liver. Distended gallbladder without visible gallstones, wall thickening or pericholecystic fluid. No biliary ductal dilatation. Pancreas: Moderate to marked diffuse pancreatic atrophy. Spleen: Normal in size without focal abnormality. Adrenals/Urinary Tract: Normal-appearing adrenal glands. Small bilateral renal cysts. These do not need imaging follow-up. Unremarkable ureters and urinary bladder. Stomach/Bowel: Stomach is within normal limits. Appendix appears normal. No evidence of bowel wall thickening, distention, or inflammatory changes. Vascular/Lymphatic: Atheromatous arterial calcifications without aneurysm. No  enlarged lymph nodes. Reproductive: Uterus and bilateral adnexa are unremarkable. Other: No abdominal wall hernia or abnormality. No abdominopelvic ascites. Musculoskeletal: Lumbar and lower thoracic spine degenerative changes. Stable L1 vertebral body compression deformity and kyphoplasty material and mild L2 superior endplate compression deformity with sclerosis. No acute fracture lines seen. Stable grade 1 retrolisthesis at the L1-2 and L2-3 levels and grade 1 anterolisthesis at the L3-4 and L4-5 levels. Stable minimal retrolisthesis at the L5-S1 level. No pars defects seen. No acute fractures. IMPRESSION: 1. No acute abnormality. 2. Diffuse hepatic steatosis. Electronically Signed   By: Claudie Revering M.D.   On: 03/08/2022 13:48   DG Chest Port 1 View  Result Date: 03/08/2022 CLINICAL DATA:  Sepsis EXAM: PORTABLE CHEST 1 VIEW COMPARISON:  05/09/2020 FINDINGS: Heart and mediastinal contours are within normal limits. No focal opacities or effusions. No acute bony abnormality. Surgical clips in the left axilla. Degenerative changes in the shoulders. IMPRESSION: No active cardiopulmonary disease. Electronically Signed   By: Rolm Baptise M.D.   On: 03/08/2022 13:11    Impression/Assessment:   Urinary retention Unable to visualize urethral meatus secondary to anatomy and patient cooperation due to discomfort After IV fluids repeat bladder scan >999 mL  Plan:  Catheter placement under sedation in cysto room.  The procedure was discussed.     03/08/2022, 7:18 PM  John Giovanni,  MD

## 2022-03-08 NOTE — ED Notes (Signed)
Pt notified RN that she is unable to urinate. RN scanned pt bladder. Pt has 757m in bladder. RN notified provider.

## 2022-03-08 NOTE — Anesthesia Preprocedure Evaluation (Signed)
Anesthesia Evaluation  Patient identified by MRN, date of birth, ID band Patient awake and Patient confused    Reviewed: Allergy & Precautions, NPO status , Patient's Chart, lab work & pertinent test results  Airway Mallampati: III  TM Distance: <3 FB Neck ROM: full    Dental  (+) Chipped   Pulmonary neg pulmonary ROS, neg shortness of breath   Pulmonary exam normal        Cardiovascular Exercise Tolerance: Good hypertension, negative cardio ROS Normal cardiovascular exam     Neuro/Psych  Headaches  Neuromuscular disease  negative psych ROS   GI/Hepatic Neg liver ROS,GERD  Controlled,,  Endo/Other  negative endocrine ROS    Renal/GU negative Renal ROS  negative genitourinary   Musculoskeletal   Abdominal   Peds  Hematology negative hematology ROS (+)   Anesthesia Other Findings Past Medical History: No date: Anxiety No date: Arthritis 1997: Breast cancer ([place])     Comment:  left breast cancer No date: Cancer ([place]) No date: Environmental and seasonal allergies No date: GERD (gastroesophageal reflux disease) No date: History of left breast cancer No date: Hyperlipidemia No date: Hypertension No date: Left bundle branch block (LBBB) No date: Neuropathy 1997: Personal history of chemotherapy     Comment:  Left breast 1997: Personal history of radiation therapy     Comment:  Left breast  Past Surgical History: No date: BACK SURGERY No date: BREAST SURGERY No date: CARDIAC CATHETERIZATION No date: DILATION AND CURETTAGE OF UTERUS No date: EYE SURGERY; Bilateral     Comment:  Cataract Extraction with IOL 09/14/2015: KNEE ARTHROPLASTY; Right     Comment:  Procedure: COMPUTER ASSISTED TOTAL KNEE ARTHROPLASTY;                Surgeon: Dereck Leep, MD;  Location: [facility] ORS;                Service: Orthopedics;  Laterality: Right; 05/30/2016: KNEE ARTHROPLASTY; Left     Comment:  Procedure: COMPUTER ASSISTED  TOTAL KNEE ARTHROPLASTY;                Surgeon: Dereck Leep, MD;  Location: [facility] OR[facility];                Service: Orthopedics;  Laterality: Left; 2016: KYPHOPLASTY     Comment:  Dr. Rudene Christians, [place]: MASTECTOMY; Left     Comment:  chemo rad mastectomy No date: TONSILLECTOMY     Reproductive/Obstetrics negative OB ROS                             Anesthesia Physical Anesthesia Plan  ASA: 3  Anesthesia Plan: General   Post-op Pain Management:    Induction: Intravenous  PONV Risk Score and Plan: Propofol infusion and TIVA  Airway Management Planned: Natural Airway and Nasal Cannula  Additional Equipment:   Intra-op Plan:   Post-operative Plan:   Informed Consent: I have reviewed the patients History and Physical, chart, labs and discussed the procedure including the risks, benefits and alternatives for the proposed anesthesia with the patient or authorized representative who has indicated his/her understanding and acceptance.     Dental Advisory Given  Plan Discussed with: Anesthesiologist, CRNA and Surgeon  Anesthesia Plan Comments: (Husband and son consented for risks of anesthesia including but not limited to:  - adverse reactions to medications - risk of airway placement if required - damage to eyes, teeth, lips or other oral  mucosa - nerve damage due to positioning  - sore throat or hoarseness - Damage to heart, brain, nerves, lungs, other parts of body or loss of life  They voiced understanding.)       Anesthesia Quick Evaluation

## 2022-03-08 NOTE — ED Triage Notes (Signed)
Pt to ED via [facility] from home for syncopal episode. Per EMS the episode was witnessed by daughter. Pt was walking to the bathroom and her daughter noticed that she was getting weak and she lowered her to the ground. Pt did not hit her head, pt is not on blood thinners. EMS reports that pt was initially confused and hypotensive, BP was 70/40. Pt was given 600 cc NS and become more responsive and blood pressure came up to 94/44. EDP and family at bedside.

## 2022-03-08 NOTE — ED Notes (Signed)
In and Out collection attempted per MD V/O. RN unable to get sample. Purewick placed.

## 2022-03-09 ENCOUNTER — Encounter: Payer: Self-pay | Admitting: Urology

## 2022-03-09 ENCOUNTER — Inpatient Hospital Stay: Payer: Medicare Other

## 2022-03-09 DIAGNOSIS — R55 Syncope and collapse: Secondary | ICD-10-CM | POA: Diagnosis not present

## 2022-03-09 DIAGNOSIS — K5900 Constipation, unspecified: Secondary | ICD-10-CM

## 2022-03-09 DIAGNOSIS — I959 Hypotension, unspecified: Secondary | ICD-10-CM

## 2022-03-09 DIAGNOSIS — I639 Cerebral infarction, unspecified: Secondary | ICD-10-CM

## 2022-03-09 DIAGNOSIS — B020 Zoster encephalitis: Secondary | ICD-10-CM | POA: Diagnosis not present

## 2022-03-09 DIAGNOSIS — R338 Other retention of urine: Secondary | ICD-10-CM

## 2022-03-09 DIAGNOSIS — G9341 Metabolic encephalopathy: Secondary | ICD-10-CM | POA: Diagnosis not present

## 2022-03-09 LAB — BASIC METABOLIC PANEL
Anion gap: 4 — ABNORMAL LOW (ref 5–15)
BUN: 14 mg/dL (ref 8–23)
CO2: 19 mmol/L — ABNORMAL LOW (ref 22–32)
Calcium: 8 mg/dL — ABNORMAL LOW (ref 8.9–10.3)
Chloride: 113 mmol/L — ABNORMAL HIGH (ref 98–111)
Creatinine, Ser: 0.62 mg/dL (ref 0.44–1.00)
GFR, Estimated: >60 mL/min (ref >60)
Glucose, Bld: 88 mg/dL (ref 70–99)
Potassium: 3.8 mmol/L (ref 3.5–5.1)
Sodium: 136 mmol/L (ref 135–145)

## 2022-03-09 LAB — CBC
HCT: 31.8 % — ABNORMAL LOW (ref 36.0–46.0)
Hemoglobin: 10.3 g/dL — ABNORMAL LOW (ref 12.0–15.0)
MCH: 29.6 pg (ref 26.0–34.0)
MCHC: 32.4 g/dL (ref 30.0–36.0)
MCV: 91.4 fL (ref 80.0–100.0)
Platelets: 220 10*3/uL (ref 150–400)
RBC: 3.48 MIL/uL — ABNORMAL LOW (ref 3.87–5.11)
RDW: 14.3 % (ref 11.5–15.5)
WBC: 9.5 10*3/uL (ref 4.0–10.5)
nRBC: 0 % (ref 0.0–0.2)

## 2022-03-09 LAB — LIPID PANEL
Cholesterol: 96 mg/dL (ref 0–200)
HDL: 19 mg/dL — ABNORMAL LOW (ref >40)
LDL Cholesterol: 56 mg/dL (ref 0–99)
Total CHOL/HDL Ratio: 5.1 RATIO
Triglycerides: 105 mg/dL (ref <150)
VLDL: 21 mg/dL (ref 0–40)

## 2022-03-09 LAB — MAGNESIUM: Magnesium: 2.1 mg/dL (ref 1.7–2.4)

## 2022-03-09 MED ORDER — CLOPIDOGREL BISULFATE 75 MG PO TABS
75.0000 mg | ORAL_TABLET | Freq: Every day | ORAL | Status: DC
  Administered 2022-03-09 – 2022-03-12 (×4): 75 mg via ORAL
  Filled 2022-03-09 (×4): qty 1

## 2022-03-09 MED ORDER — POLYETHYLENE GLYCOL 3350 17 G PO PACK
17.0000 g | PACK | Freq: Every day | ORAL | Status: DC
  Administered 2022-03-09 – 2022-03-10 (×2): 17 g via ORAL
  Filled 2022-03-09 (×2): qty 1

## 2022-03-09 MED ORDER — LACTULOSE 10 GM/15ML PO SOLN
20.0000 g | Freq: Every day | ORAL | Status: DC
  Administered 2022-03-09 – 2022-03-10 (×2): 20 g via ORAL
  Filled 2022-03-09 (×2): qty 30

## 2022-03-09 MED ORDER — MELATONIN 5 MG PO TABS: 5.0000 mg | ORAL_TABLET | Freq: Once | ORAL | Status: DC

## 2022-03-09 MED ORDER — ASPIRIN 81 MG PO TBEC
81.0000 mg | DELAYED_RELEASE_TABLET | Freq: Every day | ORAL | Status: DC
  Administered 2022-03-10 – 2022-03-12 (×3): 81 mg via ORAL
  Filled 2022-03-09 (×3): qty 1

## 2022-03-09 MED ORDER — DULOXETINE HCL 30 MG PO CPEP
30.0000 mg | ORAL_CAPSULE | Freq: Every day | ORAL | Status: DC
  Administered 2022-03-09 – 2022-03-12 (×4): 30 mg via ORAL
  Filled 2022-03-09 (×4): qty 1

## 2022-03-09 MED ORDER — POLYETHYLENE GLYCOL 3350 17 G PO PACK: 17.0000 g | PACK | Freq: Every day | ORAL | Status: DC | PRN

## 2022-03-09 MED ORDER — IOHEXOL 350 MG/ML SOLN
75.0000 mL | Freq: Once | INTRAVENOUS | Status: AC | PRN
  Administered 2022-03-09: 75 mL via INTRAVENOUS

## 2022-03-09 MED ORDER — ATORVASTATIN CALCIUM 20 MG PO TABS
40.0000 mg | ORAL_TABLET | Freq: Every evening | ORAL | Status: DC
  Administered 2022-03-09 – 2022-03-11 (×3): 40 mg via ORAL
  Filled 2022-03-09 (×3): qty 2

## 2022-03-09 MED ORDER — ASPIRIN 81 MG PO CHEW
324.0000 mg | CHEWABLE_TABLET | Freq: Once | ORAL | Status: AC
  Administered 2022-03-09: 324 mg via ORAL
  Filled 2022-03-09: qty 4

## 2022-03-09 MED ORDER — ENOXAPARIN SODIUM 40 MG/0.4ML IJ SOSY
40.0000 mg | PREFILLED_SYRINGE | INTRAMUSCULAR | Status: DC
  Administered 2022-03-09 – 2022-03-11 (×3): 40 mg via SUBCUTANEOUS
  Filled 2022-03-09 (×3): qty 0.4

## 2022-03-09 NOTE — Assessment & Plan Note (Signed)
Replaced. °

## 2022-03-09 NOTE — Assessment & Plan Note (Signed)
On Cymbalta 

## 2022-03-09 NOTE — Assessment & Plan Note (Addendum)
Improved.  Could be secondary to stroke or medications for zoster.  EEG negative for seizure.

## 2022-03-09 NOTE — Progress Notes (Signed)
Progress Note   Patient: Carly Peck:[id number] DOB: 03/28/1931 DOA: 03/08/2022     1 DOS: the patient was seen and examined on 03/09/2022   Brief hospital course: 88 y.o. female with medical history significant of hypertension, hyperlipidemia, depression with anxiety, left bundle blockade, breast cancer (s/p of radiation and chemotherapy), MGUS, tic [place], currently being treated for herpes zoster, who presents with altered mental status and syncope.   Per patient's daughter at the bedside, patient started having left-sided flank/back pain 2 weeks ago, initial workup was negative, then found to have rash breakout, therefore diagnosed as herpes zoster.  Patient was sdarted on Valtrex on Friday.  Patient was also started on Norco for pain control, but did not take Norco in the past 2 days.  In this morning, patient was found to be confused and had syncope episode.  Patient seems to have had left facial droop and left arm dropping down.   Patient has mild dry cough, no chest pain or shortness breath.  Patient does not have nausea vomiting, diarrhea or abdominal pain.  But on examination by ED physician, patient seems to have abdominal tenderness.  She denies abdominal pain to me.  Patient has burning on urination since last night.  No fever or chills. When I saw patient in the ED, patient is lethargic, but oriented x 3.  She moves all extremities.  No facial droop or slurred speech. Per report, patient had hypotension with blood pressure 70/48 which improved to 103/48 in the ED.  Patient received 600 cc normal saline by EMS and 2 L normal saline at the ED.   In ED, Found to have acute urinary retention with 700 cc urine in bladder.  Nurse attempted to put Foley catheter without success.  Consulted Dr. Bernardo Heater of urology for Foley catheter placement.  Dr. Bernardo Heater placed Foley catheter in the operating room on 03/08/2022.  Foley catheter will remain in for 10 to 14 days.  MRI of the brain  showed a punctate acute infarct in the left frontal lobe white matter.  LDL 56.  CT angio head and neck was negative for blockage.  Neurology consultation.   Assessment and Plan: * Acute stroke due to ischemia ([place]) Acute stroke in the left frontal lobe white matter.  Patient started on aspirin.  Zocor switched to Lipitor.  Neurology consult.  PT and OT consultations.  Echocardiogram ordered.  CT angio head and neck did not show any blockages.  Syncope Could be secondary to stroke or medication for zoster.  Acute metabolic encephalopathy Improved.  Could be secondary to stroke or medications for zoster.  Herpes zoster Continue acyclovir and low-dose gabapentin  Hypotension Improved.  HLD (hyperlipidemia) Switch Zocor to Lipitor.  Depression with anxiety On Cymbalta  Hypomagnesemia Replaced  Hypokalemia Replaced  Elevated lactic acid level Gentle IV fluids.  Acute urinary retention Required Foley catheter placement by urology.  Catheter will need to stay in for 10 to 14 days.  Constipation Will give 1 dose of lactulose and start MiraLAX daily.        Subjective: Patient feeling better.  Had a syncopal episode the other day.  Had pain from shingles before the rash came out.  Feels better today and mental status better today than when she came in.  Physical Exam: Vitals:   03/09/22 0104 03/09/22 0420 03/09/22 0755 03/09/22 1109  BP: (!) 111/50 (!) 111/49 (!) 119/47 (!) 113/48  Pulse: 88 95 95 88  Resp: '18 18 17 '$ 16  Temp: 98.5 F (36.9 C) 98.5 F (36.9 C) 98.9 F (37.2 C) 98.7 F (37.1 C)  TempSrc: Oral Oral    SpO2: 97% 98% 99% 98%  Weight:      Height:       Physical Exam HENT:     Head: Normocephalic.     Mouth/Throat:     Pharynx: No oropharyngeal exudate.  Eyes:     General: Lids are normal.     Conjunctiva/sclera: Conjunctivae normal.  Cardiovascular:     Rate and Rhythm: Normal rate and regular rhythm.     Heart sounds: Normal heart sounds,  S1 normal and S2 normal.  Pulmonary:     Breath sounds: No decreased breath sounds, wheezing, rhonchi or rales.  Abdominal:     Palpations: Abdomen is soft.     Tenderness: There is no abdominal tenderness.  Musculoskeletal:     Right lower leg: No swelling.     Left lower leg: No swelling.  Skin:    General: Skin is warm.     Comments: Shingles rash left buttock area wrapping around into the groin.  On the back it does have some areas that are weeping.  Neurological:     Mental Status: She is alert.     Comments: Answers questions appropriately.  Able to straight leg raise.     Data Reviewed: Creatinine 0.62, LDL 56, last lactic acid 1.7, hemoglobin 10.3  Family Communication: Spoke with family at the bedside  Disposition: Status is: Inpatient Remains inpatient appropriate because: Treating shingles and stroke.  Planned Discharge Destination: Home    Time spent: 28 minutes  Author: Loletha Grayer, MD 03/09/2022 1:53 PM  For on call review [email or web address].

## 2022-03-09 NOTE — Evaluation (Signed)
Occupational Therapy Evaluation Patient Details Name: Carly Peck MRN: [id number] DOB: [birth year] Today's Date: 03/09/2022   History of Present Illness Pt is an "88 y.o. female with medical history significant of hypertension, hyperlipidemia, depression with anxiety, left bundle blockade, breast cancer (s/p of radiation and chemotherapy), MGUS, tic douloureux, currently being treated for herpes zoster, who presents with altered mental status and syncope."   Clinical Impression   Patient presenting with decreased Ind in self care,balance, functional mobility/transfers, endurance, and safety awareness. Patient reports being Ind at baseline without use of AD and living in [place] with spouse. Pt follows commands but does need increased time to process and min cuing for safety awareness. Pt stands with min guard and ambulates to sink to stand for grooming tasks. Pt taking seated rest break and then transitions to PT evaluation. Patient will benefit from acute OT to increase overall independence in the areas of ADLs, functional mobility, and safety awareness in order to safely discharge home with assist.      Recommendations for follow up therapy are one component of a multi-disciplinary discharge planning process, led by the attending physician.  Recommendations may be updated based on patient status, additional functional criteria and insurance authorization.   Follow Up Recommendations  Home health OT     Assistance Recommended at Discharge Intermittent Supervision/Assistance  Patient can return home with the following A little help with walking and/or transfers;A little help with bathing/dressing/bathroom;Help with stairs or ramp for entrance;Assist for transportation;Assistance with cooking/housework;Direct supervision/assist for financial management;Direct supervision/assist for medications management    Functional Status Assessment  Patient has had a recent decline in their functional status  and demonstrates the ability to make significant improvements in function in a reasonable and predictable amount of time.  Equipment Recommendations  Other (comment) (RW)       Precautions / Restrictions Precautions Precautions: Fall;Other (comment) Precaution Comments: active shingles Restrictions Weight Bearing Restrictions: No      Mobility Bed Mobility Overal bed mobility: Needs Assistance Bed Mobility: Supine to Sit     Supine to sit: Min guard, Supervision                      Balance Overall balance assessment: Needs assistance           Standing balance-Leahy Scale: Fair Standing balance comment: narrow base of support during standing                           ADL either performed or assessed with clinical judgement   ADL Overall ADL's : Needs assistance/impaired     Grooming: Wash/dry face;Oral care;Standing;Min guard                                       Vision Patient Visual Report: No change from baseline              Pertinent Vitals/Pain Pain Assessment Pain Assessment: 0-10 Pain Score: 6  Pain Location: left flank/lower back Pain Descriptors / Indicators: Aching, Discomfort Pain Intervention(s): Monitored during session, Repositioned     Hand Dominance Right   Extremity/Trunk Assessment Upper Extremity Assessment Upper Extremity Assessment: Generalized weakness   Lower Extremity Assessment Lower Extremity Assessment: Generalized weakness   Cervical / Trunk Assessment Cervical / Trunk Assessment: Kyphotic   Communication Communication Communication: HOH   Cognition Arousal/Alertness: Awake/alert Behavior During Therapy:  WFL for tasks assessed/performed Overall Cognitive Status: Within Functional Limits for tasks assessed                                 General Comments: Pt follows commands but needs cuing for safety awareness throughout session with some confusion noted.                 Home Living Family/patient expects to be discharged to:: Private residence Living Arrangements: Spouse/significant other Available Help at Discharge: Family;Available PRN/intermittently Type of Home: Apartment (condo) Home Access: Level entry     Home Layout: One level     Bathroom Shower/Tub: Occupational psychologist: Handicapped height Bathroom Accessibility: Yes   Home Equipment: Shower seat - built in          Prior Functioning/Environment Prior Level of Function : Independent/Modified Independent             Mobility Comments: typically no AD use at baseline          OT Problem List: Decreased strength;Decreased activity tolerance;Impaired balance (sitting and/or standing);Decreased safety awareness;Decreased knowledge of use of DME or AE      OT Treatment/Interventions: Self-care/ADL training;Therapeutic exercise;Therapeutic activities;DME and/or AE instruction;Balance training;Patient/family education;Energy conservation;Cognitive remediation/compensation    OT Goals(Current goals can be found in the care plan section) Acute Rehab OT Goals Patient Stated Goal: to get stronger and return home OT Goal Formulation: With patient/family Time For Goal Achievement: 03/23/22 Potential to Achieve Goals: Fair ADL Goals Pt Will Perform Grooming: with modified independence;standing Pt Will Perform Lower Body Dressing: with modified independence;sit to/from stand Pt Will Transfer to Toilet: with modified independence;ambulating Pt Will Perform Toileting - Clothing Manipulation and hygiene: with modified independence;sit to/from stand  OT Frequency: Min 2X/week       AM-PAC OT "6 Clicks" Daily Activity     Outcome Measure Help from another person eating meals?: None Help from another person taking care of personal grooming?: None Help from another person toileting, which includes using toliet, bedpan, or urinal?: A Little Help from another  person bathing (including washing, rinsing, drying)?: A Little Help from another person to put on and taking off regular upper body clothing?: None Help from another person to put on and taking off regular lower body clothing?: A Little 6 Click Score: 21   End of Session Equipment Utilized During Treatment: Rolling walker (2 wheels) Nurse Communication: Mobility status  Activity Tolerance: Patient tolerated treatment well Patient left: in bed;with call bell/phone within reach;with bed alarm set  OT Visit Diagnosis: Unsteadiness on feet (R26.81);Muscle weakness (generalized) (M62.81);Repeated falls (R29.6)                Time: 1342-1400 OT Time Calculation (min): 18 min Charges:  OT General Charges $OT Visit: 1 Visit OT Evaluation $OT Eval Low Complexity: 1 Low OT Treatments $Self Care/Home Management : 8-22 mins  Darleen Crocker, MS, OTR/L , CBIS ascom [phone]  03/09/22, 4:11 PM

## 2022-03-09 NOTE — Progress Notes (Signed)
Physical Therapy Evaluation Patient Details Name: Carly Peck MRN: [id number] DOB: Apr 12, 1931 Today's Date: 03/09/2022  History of Present Illness  Pt is an "88 y.o. female with medical history significant of hypertension, hyperlipidemia, depression with anxiety, left bundle blockade, breast cancer (s/p of radiation and chemotherapy), MGUS, tic douloureux, currently being treated for herpes zoster, who presents with altered mental status and syncope."   Clinical Impression   Patient seen today for evaluation. Handoff from OT in room at beginning of session. Family members present throughout. Patient lives in a one level condo with her spouse, her daughter reports patient has been needing increased assistance with mobility/ADL's over the past week. The patient reports being relatively independent at baseline, without assistive device use, and is active socially. Today, she required contact guard assist for sit to stand transfers and for in room ambulation with intermittent use of IV pole for added stability. She was somewhat emotional and perseverative at times but was able to carry out conversation during activity, see OT note for additional cognitive assessment. Family (patients daughter and son) interested in discussing with case manager regarding options for home health assistance at D/C. PT messaged CM after session regarding family concerns. Based on today's assessment, patient would benefit from HHPT at D/C.    Pertinent imaging: "CLINICAL DATA:  Altered mental status   EXAM: MRI HEAD WITHOUT AND WITH CONTRAST    IMPRESSION: Punctate acute infarct in the left frontal lobe white matter."   Recommendations for follow up therapy are one component of a multi-disciplinary discharge planning process, led by the attending physician.  Recommendations may be updated based on patient status, additional functional criteria and insurance authorization.  Follow Up Recommendations Home health PT       Assistance Recommended at Discharge Set up Supervision/Assistance  Patient can return home with the following  A little help with walking and/or transfers;A little help with bathing/dressing/bathroom;Assistance with cooking/housework;Assist for transportation    Equipment Recommendations  (to be determined at this time)  Recommendations for Other Services  Speech consult    Functional Status Assessment Patient has had a recent decline in their functional status and demonstrates the ability to make significant improvements in function in a reasonable and predictable amount of time.     Precautions / Restrictions Precautions Precautions: Fall;Other (comment) (airborne/contact) Restrictions Weight Bearing Restrictions: No      Mobility  Bed Mobility                    Transfers Overall transfer level: Needs assistance Equipment used: None Transfers: Sit to/from Stand Sit to Stand: Min guard           General transfer comment: cues for activity pacing and hand placement during STS transfer    Ambulation/Gait Ambulation/Gait assistance: Min guard Gait Distance (Feet): 15 Feet (around room) Assistive device: IV Pole Gait Pattern/deviations: Shuffle, Narrow base of support (decreased step length bilaterally)          Stairs            Wheelchair Mobility    Modified Rankin (Stroke Patients Only)       Balance Overall balance assessment: Needs assistance         Standing balance support:  (intermittent IV pole use during standing activities) Standing balance-Leahy Scale: Fair Standing balance comment: narrow base of support during standing  Pertinent Vitals/Pain Pain Assessment Pain Assessment: 0-10 Pain Score: 6  Pain Location: left flank/lower back Pain Descriptors / Indicators: Aching, Discomfort Pain Intervention(s): Monitored during session, Repositioned    Home Living Family/patient  expects to be discharged to:: Private residence Living Arrangements: Spouse/significant other Available Help at Discharge: Family;Available PRN/intermittently Type of Home: Apartment (condo) Home Access: Level entry       Home Layout: One level Home Equipment: Shower seat - built in (may have access to spouse's rolling walker)      Prior Function Prior Level of Function : Independent/Modified Independent (per dtr, pt has been requiring physical assistance x1 week)             Mobility Comments: typically no AD use at baseline       Hand Dominance        Extremity/Trunk Assessment   Upper Extremity Assessment Upper Extremity Assessment: Defer to OT evaluation         Cervical / Trunk Assessment Cervical / Trunk Assessment: Kyphotic  Communication   Communication: HOH  Cognition Arousal/Alertness: Awake/alert                                              General Comments      Exercises     Assessment/Plan    PT Assessment Patient needs continued PT services  PT Problem List Decreased strength;Decreased activity tolerance;Decreased balance;Decreased mobility;Decreased cognition;Decreased safety awareness       PT Treatment Interventions Gait training;Functional mobility training;Therapeutic activities;Balance training    PT Goals (Current goals can be found in the Care Plan section)  Acute Rehab PT Goals Patient Stated Goal: "to go home" PT Goal Formulation: With patient/family Time For Goal Achievement: 03/23/22 Potential to Achieve Goals: Good    Frequency Min 2X/week     Co-evaluation               AM-PAC PT "6 Clicks" Mobility  Outcome Measure Help needed turning from your back to your side while in a flat bed without using bedrails?: A Little Help needed moving from lying on your back to sitting on the side of a flat bed without using bedrails?: A Little Help needed moving to and from a bed to a chair (including a  wheelchair)?: A Little Help needed standing up from a chair using your arms (e.g., wheelchair or bedside chair)?: A Little Help needed to walk in hospital room?: A Little Help needed climbing 3-5 steps with a railing? : A Little 6 Click Score: 18    End of Session   Activity Tolerance: Patient limited by pain;Patient limited by fatigue Patient left: in chair;with call bell/phone within reach;with chair alarm set;with family/visitor present Nurse Communication: Mobility status PT Visit Diagnosis: Unsteadiness on feet (R26.81);Muscle weakness (generalized) (M62.81)    Time: 9417-4081 PT Time Calculation (min) (ACUTE ONLY): 17 min   Charges:   PT Evaluation $PT Eval Moderate Complexity: 1 Mod          Clemetine Marker, PT, DPT, CCS, GCS   Derrell Lolling 03/09/2022, 3:15 PM

## 2022-03-09 NOTE — Assessment & Plan Note (Addendum)
Large bowel movement yesterday.  Abdominal x-ray showed distended gaseous bowel.

## 2022-03-09 NOTE — Assessment & Plan Note (Addendum)
Acute stroke in the left frontal lobe white matter.  Patient started on aspirin and should be continued indefinitely.  Zocor switched to Lipitor.  Plavix for another 19 days then can stop.  Echocardiogram negative.  CT angio head and neck did not show any blockages.  Neurology referral by PCP (neurologist in PCPs office).

## 2022-03-09 NOTE — Assessment & Plan Note (Addendum)
Patient received fluids during the hospital course

## 2022-03-09 NOTE — Assessment & Plan Note (Addendum)
Continue IV acyclovir and low-dose gabapentin

## 2022-03-09 NOTE — Assessment & Plan Note (Signed)
Improved

## 2022-03-09 NOTE — Assessment & Plan Note (Addendum)
Required Foley catheter placement in the operating room by urology.  Catheter will need to stay in for 10 to 14 days.  Asked nursing staff to teach family how to drain Foley catheter.  Follow-up with urology for voiding trial.

## 2022-03-09 NOTE — Consult Note (Signed)
Pharmacy Antibiotic Note  Carly Peck is a 88 y.o. female admitted on 03/08/2022 with acute metabolic encephalopathy. Patient was evaluated in the emergency department on 03/03/22 and diagnosed with a "bad case of herpes zoster" had an MRI of her spine at that time and was ultimately started on Valtrex and gabapentin. States that she has been staying with her and has had progressively worsening altered mental status over the past 2 days  Pharmacy has been consulted for acyclovir dosing.  Plan: Initiate acyclovir 10 mg/kg (620 mg) Q8H (Normalized CrCl > 50) Initiate NS @ 125 mL/hr BMP daily x 3 days  Height: '5\' 4"'$  (162.6 cm) Weight: 71.8 kg (158 lb 4.6 oz) IBW/kg (Calculated) : 54.7  Temp (24hrs), Avg:98.2 F (36.8 C), Min:97.9 F (36.6 C), Max:98.5 F (36.9 C)  Recent Labs  Lab 03/08/22 1227 03/08/22 1239 03/08/22 2202 03/09/22 0405  WBC 10.6*  --   --  9.5  CREATININE 0.83  --   --  0.62  LATICACIDVEN  --  2.2* 1.7  --      Estimated Creatinine Clearance: 47.2 mL/min (by C-G formula based on SCr of 0.62 mg/dL).    Allergies  Allergen Reactions   Adhesive [Tape] Other (See Comments)    Red mark, Please use "paper" tape    Antimicrobials this admission: 12/19 acyclovir >>   Dose adjustments this admission:   Microbiology results: 12/19 BCx: sent 12/19 UCx: sent    Thank you for allowing pharmacy to be a part of this patient's care.  Wynelle Cleveland, PharmD, BCPS Clinical Pharmacist   03/09/2022 7:43 AM

## 2022-03-09 NOTE — Hospital Course (Addendum)
88 y.o. female with medical history significant of hypertension, hyperlipidemia, depression with anxiety, left bundle blockade, breast cancer (s/p of radiation and chemotherapy), MGUS, tic douloureux, currently being treated for herpes zoster, who presents with altered mental status and syncope.   Per patient's daughter at the bedside, patient started having left-sided flank/back pain 2 weeks ago, initial workup was negative, then found to have rash breakout, therefore diagnosed as herpes zoster.  Patient was sdarted on Valtrex on Friday.  Patient was also started on Norco for pain control, but did not take Norco in the past 2 days.  In this morning, patient was found to be confused and had syncope episode.  Patient seems to have had left facial droop and left arm dropping down.   Patient has mild dry cough, no chest pain or shortness breath.  Patient does not have nausea vomiting, diarrhea or abdominal pain.  But on examination by ED physician, patient seems to have abdominal tenderness.  She denies abdominal pain to me.  Patient has burning on urination since last night.  No fever or chills. When I saw patient in the ED, patient is lethargic, but oriented x 3.  She moves all extremities.  No facial droop or slurred speech. Per report, patient had hypotension with blood pressure 70/48 which improved to 103/48 in the ED.  Patient received 600 cc normal saline by EMS and 2 L normal saline at the ED.   In ED, Found to have acute urinary retention with 700 cc urine in bladder.  Nurse attempted to put Foley catheter without success.  Consulted Dr. Bernardo Heater of urology for Foley catheter placement.  Dr. Bernardo Heater placed Foley catheter in the operating room on 03/08/2022.  Foley catheter will remain in for 10 to 14 days.  MRI of the brain showed a punctate acute infarct in the left frontal lobe white matter.  LDL 56.  CT angio head and neck was negative for blockage.  Neurology consultation.  12/22.  Continue IV  acyclovir today.  Shingles lesions starting to dry up and scab up on her back.  12/23.  Shingles lesions continuing to dry up.  Will complete IV acyclovir today.  Sodium dropping lower secondary to IV fluids which were required to be given while giving IV acyclovir.  This will improve upon disposition.

## 2022-03-09 NOTE — Assessment & Plan Note (Addendum)
Switched Zocor to Lipitor.

## 2022-03-09 NOTE — Assessment & Plan Note (Signed)
Could be secondary to stroke or medication for zoster.

## 2022-03-09 NOTE — Progress Notes (Signed)
  Transition of Care (TOC) Screening Note   Patient Details  Name: Carly Peck Date of Birth: 18-Apr-1931   Transition of Care [facility]) CM/SW Contact:    Quin Hoop, LCSW Phone Number: 03/09/2022, 11:55 AM    Transition of Care Department [facility]) has reviewed patient and no TOC needs have been identified at this time. We will continue to monitor patient advancement through interdisciplinary progression rounds. If new patient transition needs arise, please place a TOC consult.

## 2022-03-10 ENCOUNTER — Inpatient Hospital Stay
Admit: 2022-03-10 | Discharge: 2022-03-10 | Disposition: A | Discharge status: Home / Self Care | Payer: Medicare Other | Attending: Internal Medicine | Admitting: Internal Medicine

## 2022-03-10 DIAGNOSIS — G9341 Metabolic encephalopathy: Secondary | ICD-10-CM | POA: Diagnosis not present

## 2022-03-10 DIAGNOSIS — R55 Syncope and collapse: Secondary | ICD-10-CM | POA: Diagnosis not present

## 2022-03-10 DIAGNOSIS — I639 Cerebral infarction, unspecified: Secondary | ICD-10-CM | POA: Diagnosis not present

## 2022-03-10 DIAGNOSIS — B020 Zoster encephalitis: Secondary | ICD-10-CM | POA: Diagnosis not present

## 2022-03-10 DIAGNOSIS — R4182 Altered mental status, unspecified: Secondary | ICD-10-CM

## 2022-03-10 LAB — ECHOCARDIOGRAM COMPLETE
AR max vel: 2.13 cm2
AV Area VTI: 3 cm2
AV Area mean vel: 2.1 cm2
AV Mean grad: 4 mmHg
AV Peak grad: 8 mmHg
Ao pk vel: 1.41 m/s
Area-P 1/2: 6.12 cm2
Height: 64 in
S' Lateral: 2.6 cm
Weight: 2532.64 oz

## 2022-03-10 LAB — URINE CULTURE: Culture: NO GROWTH

## 2022-03-10 LAB — BASIC METABOLIC PANEL
Anion gap: 5 (ref 5–15)
BUN: 10 mg/dL (ref 8–23)
CO2: 20 mmol/L — ABNORMAL LOW (ref 22–32)
Calcium: 7.9 mg/dL — ABNORMAL LOW (ref 8.9–10.3)
Chloride: 107 mmol/L (ref 98–111)
Creatinine, Ser: 0.51 mg/dL (ref 0.44–1.00)
GFR, Estimated: >60 mL/min (ref >60)
Glucose, Bld: 99 mg/dL (ref 70–99)
Potassium: 3.3 mmol/L — ABNORMAL LOW (ref 3.5–5.1)
Sodium: 132 mmol/L — ABNORMAL LOW (ref 135–145)

## 2022-03-10 MED ORDER — POTASSIUM CHLORIDE CRYS ER 20 MEQ PO TBCR
40.0000 meq | EXTENDED_RELEASE_TABLET | Freq: Once | ORAL | Status: AC
  Administered 2022-03-10: 40 meq via ORAL
  Filled 2022-03-10: qty 2

## 2022-03-10 MED ORDER — CHLORHEXIDINE GLUCONATE CLOTH 2 % EX PADS: 6.0000 | MEDICATED_PAD | Freq: Every day | CUTANEOUS | Status: DC

## 2022-03-10 MED ORDER — CHLORHEXIDINE GLUCONATE CLOTH 2 % EX PADS
6.0000 | MEDICATED_PAD | Freq: Every day | CUTANEOUS | Status: DC
  Administered 2022-03-10 – 2022-03-12 (×3): 6 via TOPICAL

## 2022-03-10 MED ORDER — POLYETHYLENE GLYCOL 3350 17 G PO PACK
17.0000 g | PACK | Freq: Two times a day (BID) | ORAL | Status: DC
  Administered 2022-03-10: 17 g via ORAL
  Filled 2022-03-10 (×2): qty 1

## 2022-03-10 MED ORDER — LACTULOSE 10 GM/15ML PO SOLN
20.0000 g | Freq: Two times a day (BID) | ORAL | Status: DC
  Administered 2022-03-10 (×2): 20 g via ORAL
  Filled 2022-03-10 (×3): qty 30

## 2022-03-10 NOTE — Procedures (Signed)
Routine EEG Report  Carly Peck is a 88 y.o. female with a history of altered mental status who is undergoing an EEG to evaluate for seizures.  Report: This EEG was acquired with electrodes placed according to the International 10-20 electrode system (including Fp1, Fp2, F3, F4, C3, C4, P3, P4, O1, O2, T3, T4, T5, T6, A1, A2, Fz, Cz, Pz). The following electrodes were missing or displaced: none.  The occipital dominant rhythm was 6-7 Hz. This activity is reactive to stimulation. Drowsiness was manifested by background fragmentation; deeper stages of sleep were identified by K complexes and sleep spindles. There was no focal slowing. There were no interictal epileptiform discharges. There were no electrographic seizures identified. Photic stimulation and hyperventilation were not performed.  Impression and clinical correlation: This EEG was obtained while awake and asleep and is abnormal due to mild diffuse slowing indicative of global cerebral dysfunction. Epileptiform abnormalities were not seen during this recording.  Su Monks, MD [facility] [phone]  If 7pm- 7am, please page neurology on call as listed in [place].

## 2022-03-10 NOTE — Progress Notes (Signed)
Physical Therapy Treatment Patient Details Name: Carly Peck MRN: [id number] DOB: Jul 05, 1931 Today's Date: 03/10/2022   History of Present Illness Pt is an "88 y.o. female with medical history significant of hypertension, hyperlipidemia, depression with anxiety, left bundle blockade, breast cancer (s/p of radiation and chemotherapy), MGUS, tic [place], currently being treated for herpes zoster, who presents with altered mental status and syncope."    PT Comments    Patient tolerated session well today, however, she remains limited by left lower flank pain/stiffness. Patient reports 7/10 pain during session, light stretches implemented with minimal pain relief. Patient required contact guard assist for sit to stand transfer from recliner, patient asking to walk into bathroom in attempt to have a BM. Patient ambulated 15 feet x 2 bouts (to/from bathroom) with rolling walker and contact guard assist. Patient unable to have BM despite sitting on commode x10 minutes. Patient did fatigue with minimal exertion but was able to tolerate more activity than yesterday. Encouraged patient to use bedside commode for toileting to conserve energy as able. Continue to progress as tolerated.    Recommendations for follow up therapy are one component of a multi-disciplinary discharge planning process, led by the attending physician.  Recommendations may be updated based on patient status, additional functional criteria and insurance authorization.  Follow Up Recommendations  Home health PT     Assistance Recommended at Discharge Set up Supervision/Assistance  Patient can return home with the following A little help with walking and/or transfers;A little help with bathing/dressing/bathroom;Assistance with cooking/housework;Assist for transportation   Equipment Recommendations  Other (comment) (to be determined)    Recommendations for Other Services       Precautions / Restrictions  Precautions Precautions: Fall;Other (comment) (airborne, contact) Precaution Comments: active shingles Restrictions Weight Bearing Restrictions: No     Mobility  Bed Mobility                    Transfers Overall transfer level: Needs assistance Equipment used: Rolling walker (2 wheels) Transfers: Sit to/from Stand Sit to Stand: Min guard           General transfer comment: cues for activity pacing and hand placement during STS transfer    Ambulation/Gait Ambulation/Gait assistance: Min guard Gait Distance (Feet): 30 Feet Assistive device: Rolling walker (2 wheels) Gait Pattern/deviations: Shuffle, Narrow base of support, Trunk flexed       General Gait Details: patient ambulated 15' to/from bathroom with rolling walker   Stairs             Wheelchair Mobility    Modified Rankin (Stroke Patients Only)       Balance Overall balance assessment: Needs assistance         Standing balance support: Reliant on assistive device for balance Standing balance-Leahy Scale: Fair                              Cognition Arousal/Alertness: Awake/alert                                              Exercises General Exercises - Lower Extremity Ankle Circles/Pumps: AROM, Strengthening, 10 reps, Both Quad Sets: AROM, Strengthening, Both, 10 reps    General Comments General comments (skin integrity, edema, etc.): shingles rash noted on left lower flank area  Pertinent Vitals/Pain Pain Assessment Pain Assessment: 0-10 Pain Score: 7  Pain Location: left flank/lower back Pain Descriptors / Indicators: Aching, Discomfort Pain Intervention(s): Monitored during session, Limited activity within patient's tolerance    Home Living                          Prior Function            PT Goals (current goals can now be found in the care plan section) Acute Rehab PT Goals Patient Stated Goal: "to go home" PT  Goal Formulation: With patient/family Time For Goal Achievement: 03/23/22 Potential to Achieve Goals: Good Progress towards PT goals: Progressing toward goals    Frequency    Min 2X/week      PT Plan Current plan remains appropriate    Co-evaluation              AM-PAC PT "6 Clicks" Mobility   Outcome Measure  Help needed turning from your back to your side while in a flat bed without using bedrails?: A Little Help needed moving from lying on your back to sitting on the side of a flat bed without using bedrails?: A Little Help needed moving to and from a bed to a chair (including a wheelchair)?: A Little Help needed standing up from a chair using your arms (e.g., wheelchair or bedside chair)?: A Little Help needed to walk in hospital room?: A Little Help needed climbing 3-5 steps with a railing? : A Little 6 Click Score: 18    End of Session   Activity Tolerance: Patient limited by pain;Patient limited by fatigue Patient left: in chair;with call bell/phone within reach;with chair alarm set;with family/visitor present Nurse Communication: Mobility status PT Visit Diagnosis: Unsteadiness on feet (R26.81);Muscle weakness (generalized) (M62.81)     Time: 0350-0938 PT Time Calculation (min) (ACUTE ONLY): 36 min  Charges:  $Therapeutic Activity: 23-37 mins                     Clemetine Marker, PT, DPT, CCS, GCS    Derrell Lolling 03/10/2022, 2:41 PM

## 2022-03-10 NOTE — Progress Notes (Signed)
Progress Note   Patient: Carly Peck NLG:[id number] DOB: Sep 05, 1931 DOA: 03/08/2022     2 DOS: the patient was seen and examined on 03/10/2022   Brief hospital course: 88 y.o. female with medical history significant of hypertension, hyperlipidemia, depression with anxiety, left bundle blockade, breast cancer (s/p of radiation and chemotherapy), MGUS, tic [place], currently being treated for herpes zoster, who presents with altered mental status and syncope.   Per patient's daughter at the bedside, patient started having left-sided flank/back pain 2 weeks ago, initial workup was negative, then found to have rash breakout, therefore diagnosed as herpes zoster.  Patient was sdarted on Valtrex on Friday.  Patient was also started on Norco for pain control, but did not take Norco in the past 2 days.  In this morning, patient was found to be confused and had syncope episode.  Patient seems to have had left facial droop and left arm dropping down.   Patient has mild dry cough, no chest pain or shortness breath.  Patient does not have nausea vomiting, diarrhea or abdominal pain.  But on examination by ED physician, patient seems to have abdominal tenderness.  She denies abdominal pain to me.  Patient has burning on urination since last night.  No fever or chills. When I saw patient in the ED, patient is lethargic, but oriented x 3.  She moves all extremities.  No facial droop or slurred speech. Per report, patient had hypotension with blood pressure 70/48 which improved to 103/48 in the ED.  Patient received 600 cc normal saline by EMS and 2 L normal saline at the ED.   In ED, Found to have acute urinary retention with 700 cc urine in bladder.  Nurse attempted to put Foley catheter without success.  Consulted Dr. Bernardo Heater of urology for Foley catheter placement.  Dr. Bernardo Heater placed Foley catheter in the operating room on 03/08/2022.  Foley catheter will remain in for 10 to 14 days.  MRI of the brain  showed a punctate acute infarct in the left frontal lobe white matter.  LDL 56.  CT angio head and neck was negative for blockage.  Neurology consultation.   Assessment and Plan: * Acute stroke due to ischemia ([place]) Acute stroke in the left frontal lobe white matter.  Patient started on aspirin.  Zocor switched to Lipitor.  Neurology consult.  PT and OT consultations.  Echocardiogram negative.  CT angio head and neck did not show any blockages.  Syncope Could be secondary to stroke or medication for zoster.  Acute metabolic encephalopathy Improved.  Could be secondary to stroke or medications for zoster.  EEG done and still awaiting read.  Herpes zoster Continue acyclovir and low-dose gabapentin  Hypotension Improved.  HLD (hyperlipidemia) Switch Zocor to Lipitor.  Depression with anxiety On Cymbalta  Hypomagnesemia Replaced  Hypokalemia Replaced  Elevated lactic acid level Discontinue fluids today  Acute urinary retention Required Foley catheter placement by urology.  Catheter will need to stay in for 10 to 14 days.  Constipation Make lactulose and MiraLAX twice daily.        Subjective: Patient complains of a little stiffness.  Physical Exam: Vitals:   03/09/22 2109 03/09/22 2352 03/10/22 0434 03/10/22 0738  BP: (!) 111/57 (!) 128/56 (!) 123/58 134/60  Pulse: 93 97 97 95  Resp: '18 18 17 16  '$ Temp: 98.8 F (37.1 C) 98.5 F (36.9 C) 98.8 F (37.1 C) 98.7 F (37.1 C)  TempSrc: Oral Oral Oral Oral  SpO2: 98% 98%  98% 97%  Weight:      Height:       Physical Exam HENT:     Head: Normocephalic.     Mouth/Throat:     Pharynx: No oropharyngeal exudate.  Eyes:     General: Lids are normal.     Conjunctiva/sclera: Conjunctivae normal.  Cardiovascular:     Rate and Rhythm: Normal rate and regular rhythm.     Heart sounds: Normal heart sounds, S1 normal and S2 normal.  Pulmonary:     Breath sounds: No decreased breath sounds, wheezing, rhonchi or rales.   Abdominal:     Palpations: Abdomen is soft.     Tenderness: There is no abdominal tenderness.  Musculoskeletal:     Right lower leg: No swelling.     Left lower leg: No swelling.  Skin:    General: Skin is warm.     Comments: Shingles rash left buttock area wrapping around into the groin.  On the back it does have some areas that are weeping.  Neurological:     Mental Status: She is alert.     Comments: Answers questions appropriately.  Able to straight leg raise.     Data Reviewed: Sodium 132, potassium 3.3, hemoglobin 10.3  Family Communication: Spoke with husband at the bedside and daughter on the phone  Disposition: Status is: Inpatient Remains inpatient appropriate because: Trying to see if shingles rash can dry up a little bit before going home.  Planned Discharge Destination: Home with home health    Time spent: 28 minutes  Author: Loletha Grayer, MD 03/10/2022 2:00 PM  For on call review [email or web address].

## 2022-03-10 NOTE — Progress Notes (Signed)
*  PRELIMINARY RESULTS* Echocardiogram 2D Echocardiogram has been performed.  Sherrie Sport 03/10/2022, 9:22 AM

## 2022-03-10 NOTE — Progress Notes (Signed)
Eeg done 

## 2022-03-10 NOTE — TOC Progression Note (Addendum)
Transition of Care [facility]) - Progression Note    Patient Details  Name: Carly Peck MRN: [id number] Date of Birth: 09/05/1931  Transition of Care [facility] CM/SW Contact  Laurena Slimmer, RN Phone Number: 03/10/2022, 3:15 PM  Clinical Narrative:    Retrieved a message from [email or web address] for OT would be delayed for 6 weeks.  Spoke with Feliberto Harts. Patient care accepted for PT and OT with no delays for [facility].          Expected Discharge Plan and Services                                               Social Determinants of Health (SDOH) Interventions SDOH Screenings   Tobacco Use: Low Risk  (03/09/2022)    Readmission Risk Interventions     No data to display

## 2022-03-10 NOTE — Consult Note (Addendum)
NEUROLOGY CONSULTATION NOTE   Date of service: March 10, 2022 Patient Name: Carly Peck MRN:  [id number] DOB:  05/26/1931 Reason for consult: acute stroke on MRI Requesting physician: Dr. Loletha Grayer _ _ _   _ __   _ __ _ _  __ __   _ __   __ _  History of Present Illness   This is a 88 yo woman with hx significant for HTN, HL, breast cancer s/p radiation and chemo, MGUS, trigeminal neuralgia currently being treated for herpes zoster who presents with AMS and syncope. Patient had recently been started on norco for pain control but had not taken it in the 2 days prior when she was found to be confused and had a syncopal episode. Per family she was noted to have a facial droop at the time (unclear which side) and also to be hypotensive. Neurology is consulted 2/2 punctate acute infarct in the L frontal lobe white matter on MRI brain wwo contrast (personal review).  Stroke workup this admission:  CTA H&N - no hemodynamically significant stenosis TTE pending  Stroke Labs     Component Value Date/Time   CHOL 96 03/09/2022 0359   TRIG 105 03/09/2022 0359   HDL 19 (L) 03/09/2022 0359   CHOLHDL 5.1 03/09/2022 0359   VLDL 21 03/09/2022 0359   LDLCALC 56 03/09/2022 0359    Lab Results  Component Value Date/Time   HGBA1C 6.1 02/03/2014 04:19 AM    Despite patient having several sedating medications on her list baclofen, Zetia, Cymbalta, Neurontin, hydrocodone per family she has only been taking the gabapentin. Per family she is closer to her baseline mental status than she was on admission but still seems to be disoriented in the setting of severe pain 2/2 zoster. She has not been sleeping well 2/2 the pain and continues on gabapentin at '100mg'$  tid.  ROS   Per HPI: all other systems reviewed and are negative  Past History   I have reviewed the following:  Past Medical History:  Diagnosis Date   Anxiety    Arthritis    Breast cancer ([place]) 1997   left breast cancer    Cancer ([place])    Environmental and seasonal allergies    GERD (gastroesophageal reflux disease)    History of left breast cancer    Hyperlipidemia    Hypertension    Left bundle branch block (LBBB)    Neuropathy    Personal history of chemotherapy 1997   Left breast   Personal history of radiation therapy 1997   Left breast   Past Surgical History:  Procedure Laterality Date   BACK SURGERY     BREAST SURGERY     CARDIAC CATHETERIZATION     CYSTOSCOPY N/A 03/08/2022   Procedure: Foley placement;  Surgeon: Abbie Sons, MD;  Location: [facility] ORS;  Service: Urology;  Laterality: N/A;   DILATION AND CURETTAGE OF UTERUS     EYE SURGERY Bilateral    Cataract Extraction with IOL   KNEE ARTHROPLASTY Right 09/14/2015   Procedure: COMPUTER ASSISTED TOTAL KNEE ARTHROPLASTY;  Surgeon: Dereck Leep, MD;  Location: [facility] ORS;  Service: Orthopedics;  Laterality: Right;   KNEE ARTHROPLASTY Left 05/30/2016   Procedure: COMPUTER ASSISTED TOTAL KNEE ARTHROPLASTY;  Surgeon: Dereck Leep, MD;  Location: [facility] ORS;  Service: Orthopedics;  Laterality: Left;   KYPHOPLASTY  2016   Dr. Rudene Christians, [place] Left 1997   chemo rad mastectomy   TONSILLECTOMY  Family History  Problem Relation Age of Onset   Breast cancer Neg Hx    Social History   Socioeconomic History   Marital status: Married    Spouse name: Not on file   Number of children: Not on file   Years of education: Not on file   Highest education level: Not on file  Occupational History   Not on file  Tobacco Use   Smoking status: Never   Smokeless tobacco: Never  Substance and Sexual Activity   Alcohol use: Yes    Alcohol/week: 1.0 standard drink of alcohol    Types: 1 Glasses of wine per week    Comment: occ.   Drug use: No   Sexual activity: Not on file  Other Topics Concern   Not on file  Social History Narrative   Not on file   Social Determinants of Health   Financial Resource Strain: Not on file  Food  Insecurity: Not on file  Transportation Needs: Not on file  Physical Activity: Not on file  Stress: Not on file  Social Connections: Not on file   Allergies  Allergen Reactions   Adhesive [Tape] Other (See Comments)    Red mark, Please use "paper" tape    Medications   Medications Prior to Admission  Medication Sig Dispense Refill Last Dose   baclofen (LIORESAL) 10 MG tablet Take 5 mg by mouth 3 (three) times daily.   Past Week   carvedilol (COREG) 12.5 MG tablet Take 12.5 mg by mouth 2 (two) times daily with a meal.   Past Week   cetirizine (ZYRTEC) 10 MG tablet Take 10 mg by mouth daily.   Past Week   Cholecalciferol (VITAMIN D3) 2000 units TABS Take 2,000 Units by mouth daily.   Past Week   Cyanocobalamin (VITAMIN B-12) 1000 MCG SUBL Place under the tongue.   Past Week   DULoxetine (CYMBALTA) 30 MG capsule Take 30 mg by mouth daily.   Past Week   gabapentin (NEURONTIN) 100 MG capsule Take 1 capsule (100 mg total) by mouth 3 (three) times daily. 90 capsule 0 Past Week   losartan (COZAAR) 50 MG tablet Take 50 mg by mouth daily.   Past Week   omeprazole (PRILOSEC) 20 MG capsule Take 20 mg by mouth daily.   Past Week   sennosides-docusate sodium (SENOKOT-S) 8.6-50 MG tablet Take 4 tablets by mouth at bedtime.    Past Week   simvastatin (ZOCOR) 40 MG tablet Take 40 mg by mouth at bedtime.   Past Week   spironolactone (ALDACTONE) 25 MG tablet Take 1 tablet by mouth daily.   Past Week   valACYclovir (VALTREX) 1000 MG tablet Take 1 tablet (1,000 mg total) by mouth 2 (two) times daily for 7 days. 14 tablet 0 Past Week   acetaminophen (TYLENOL) 500 MG tablet Take 1,000 mg by mouth 2 (two) times daily as needed for mild pain or headache.    prn   Azelastine HCl 137 MCG/SPRAY SOLN SMARTSIG:1 Spray(s) Both Nares Every 12 Hours PRN   prn   clobetasol ointment (TEMOVATE) 0.05 % APPLY TO AFFECTED AREA DAILY AS NEEDED for rash on buttocks  3 prn   fluticasone (FLONASE) 50 MCG/ACT nasal spray Place  2 sprays into both nostrils 2 (two) times daily as needed for rhinitis.   prn   gabapentin (NEURONTIN) 300 MG capsule Take 300 mg by mouth 3 (three) times daily. (Patient not taking: Reported on 03/08/2022)   Not Taking  Current Facility-Administered Medications:    0.9 %  sodium chloride infusion, , Intravenous, Continuous, Wieting, Richard, MD, Last Rate: 50 mL/hr at 03/10/22 0553, New Bag at 03/10/22 0553   acetaminophen (TYLENOL) tablet 650 mg, 650 mg, Oral, Q6H PRN, Ivor Costa, MD, 650 mg at 03/09/22 2228   acyclovir (ZOVIRAX) 620 mg in dextrose 5 % 100 mL IVPB, 10 mg/kg, Intravenous, Q8H, Dorothe Pea, RPH, Last Rate: 112.4 mL/hr at 03/10/22 0553, 620 mg at 03/10/22 0553   aspirin EC tablet 81 mg, 81 mg, Oral, Daily, Wieting, Richard, MD   atorvastatin (LIPITOR) tablet 40 mg, 40 mg, Oral, QPM, Wieting, Richard, MD, 40 mg at 03/09/22 1742   clopidogrel (PLAVIX) tablet 75 mg, 75 mg, Oral, Daily, Derek Jack, MD, 75 mg at 03/09/22 2226   DULoxetine (CYMBALTA) DR capsule 30 mg, 30 mg, Oral, Daily, Wynelle Cleveland, RPH, 30 mg at 03/09/22 1344   enoxaparin (LOVENOX) injection 40 mg, 40 mg, Subcutaneous, Q24H, Wynelle Cleveland, RPH, 40 mg at 03/09/22 2225   fluticasone (FLONASE) 50 MCG/ACT nasal spray 2 spray, 2 spray, Each Nare, BID PRN, Ivor Costa, MD   gabapentin (NEURONTIN) capsule 100 mg, 100 mg, Oral, TID, Ivor Costa, MD, 100 mg at 03/09/22 2226   lactulose (CHRONULAC) 10 GM/15ML solution 20 g, 20 g, Oral, Daily, Wieting, Richard, MD, 20 g at 03/09/22 0956   melatonin tablet 5 mg, 5 mg, Oral, Once, Sharion Settler, NP   [facility]anset[facility]) injection 4 mg, 4 mg, Intravenous, Q8H PRN, Ivor Costa, MD   pantoprazole (PROTONIX) EC tablet 40 mg, 40 mg, Oral, Daily, Ivor Costa, MD, 40 mg at 03/09/22 0939   polyethylene glycol (MIRALAX / GLYCOLAX) packet 17 g, 17 g, Oral, Daily, Loletha Grayer, MD, 17 g at 03/09/22 1507  Vitals   Vitals:   03/09/22 1506 03/09/22 2109  03/09/22 2352 03/10/22 0434  BP: (!) 112/49 (!) 111/57 (!) 128/56 (!) 123/58  Pulse: 89 93 97 97  Resp: '19 18 18 17  '$ Temp: 97.7 F (36.5 C) 98.8 F (37.1 C) 98.5 F (36.9 C) 98.8 F (37.1 C)  TempSrc: Oral Oral Oral Oral  SpO2: 99% 98% 98% 98%  Weight:      Height:         Body mass index is 27.17 kg/m.  Physical Exam   Physical Exam Gen: alert and oriented x2 to self and hospital, also to family at bedside HEENT: Atraumatic, normocephalic;mucous membranes moist; oropharynx clear, tongue without atrophy or fasciculations. Neck: Supple, trachea midline. Resp: CTAB, no w/r/r CV: RRR, no m/g/r; nml S1 and S2. 2+ symmetric peripheral pulses. Abd: soft/NT/ND; nabs x 4 quad Extrem: Nml bulk; no cyanosis, clubbing, or edema.  Neuro: *MS: alert and oriented x2 to self and hospital, also to family at bedside, follows simple commands *Speech: fluid, nondysarthric, able to name and repeat *CN:    I: Deferred   II,III: PERRLA, VFF by confrontation in 4 quadrants but decreased central vision bilat 2/2 chronic macular degeneration, optic discs unable to be visualized 2/2 pupillary constriction   III,IV,VI: EOMI w/o nystagmus, no ptosis   V: Sensation intact from V1 to V3 to LT   VII: Eyelid closure was full.  Smile symmetric.   VIII: Hearing intact to voice   IX,X: Voice normal, palate elevates symmetrically    XI: SCM/trap 5/5 bilat   XII: Tongue protrudes midline, no atrophy or fasciculations  *Motor:   Normal bulk.  No tremor, rigidity or bradykinesia. Mild BUE drift  bilat but not to bed. BLE some movement against gravity, symmetric, pain-limited. *Sensory: SILT. Symmetric. Propioception intact bilat.  No double-simultaneous extinction.  *Coordination:  no ataxia on FNF bilat *Reflexes:  2+ and symmetric throughout without clonus; toes down-going bilat *Gait: deferred  NIHSS  1a Level of Conscious.: 0 1b LOC Questions: 0 (oriented to age and month) 1c LOC Commands: 0 2 Best  Gaze: 0 3 Visual: 0 4 Facial Palsy: 0 5a Motor Arm - left: 1 5b Motor Arm - Right: 1 6a Motor Leg - Left: 2 6b Motor Leg - Right: 2 7 Limb Ataxia: 0 8 Sensory: 0 9 Best Language: 0 10 Dysarthria: 0 11 Extinct. and Inatten.: 0  TOTAL: 6   Premorbid mRS = 2   Labs   CBC:  Recent Labs  Lab 03/08/22 1227 03/09/22 0405  WBC 10.6* 9.5  NEUTROABS 9.0*  --   HGB 10.3* 10.3*  HCT 32.5* 31.8*  MCV 92.3 91.4  PLT 230 809    Basic Metabolic Panel:  Lab Results  Component Value Date   NA 132 (L) 03/10/2022   K 3.3 (L) 03/10/2022   CO2 20 (L) 03/10/2022   GLUCOSE 99 03/10/2022   BUN 10 03/10/2022   CREATININE 0.51 03/10/2022   CALCIUM 7.9 (L) 03/10/2022   GFRNONAA >60 03/10/2022   GFRAA >60 09/25/2019   Lipid Panel:  Lab Results  Component Value Date   LDLCALC 56 03/09/2022   HgbA1c:  Lab Results  Component Value Date   HGBA1C 6.1 02/03/2014   Urine Drug Screen: No results found for: "LABOPIA", "COCAINSCRNUR", "LABBENZ", "AMPHETMU", "THCU", "LABBARB"  Alcohol Level No results found for: "ETH"   Impression   This is a 88 yo woman with hx significant for HTN, HL, breast cancer s/p radiation and chemo, MGUS, trigeminal neuralgia currently being treated for herpes zoster who presents with AMS and syncope. Neurology is consulted for tiny acute infarct L frontal lobe. Though the stroke could have caused transient facial droop, now resolved, she is otherwise asymptomatic from it on exam and it would not explain her syncope which is felt to have been 2/2 transient hypotension  Recommendations   # stroke workup - Permissive HTN x48 hrs from sx onset goal BP <220/110. PRN labetalol or hydralazine if BP above these parameters. Avoid oral antihypertensives. - f/u TTE - Check A1c and LDL + add statin per guidelines - ASA '81mg'$  daily + plavix '75mg'$  daily x21 days f/b ASA '81mg'$  daily monotherapy after that - q4 hr neuro checks - STAT head CT for any change in neuro exam -  Tele - PT/OT/SLP - Stroke education - Amb referral to neurology upon discharge   # AMS Workup thus far unrevealing, favored to be 2/2 delirium in the setting of pain, sleep deprivation, and gabapentin usage. I am not going to discontinue her gabapentin given her current level of pain but I would not increase her dose. No symptoms other than mental status change to be concerned for zoster encephalitis and no specific workup for encephalitis is indicated. EEG would be warranted in the setting of acute stroke to rule out epileptiform activity. - Pain control per primary team - rEEG - Avoid deliriogenic medications when possible  Will f/u results of TTE tomorrow and discuss with primary team ______________________________________________________________________   Thank you for the opportunity to take part in the care of this patient. If you have any further questions, please contact the neurology consultation attending.  Signed,  Su Monks, MD [facility] [phone]  If 7pm- 7am, please page neurology on call as listed in [place].  **Any copied and pasted documentation in this note was written by me in another application not billed for and pasted by me into this document.

## 2022-03-10 NOTE — Plan of Care (Signed)
Neurology plan of care  Please see neurology consult note from yesterday for full findings and recommendations. TTE showed no intracardiac clot. Stroke workup is now completed.  rEEG showed no epileptiform abnormalities.  Patient may f/u with PCP and be referred to outpatient neurology if needed.   Neurology will sign off but please re-engage if additional neurologic concerns arise.  Su Monks, MD [facility] [phone]  If 7pm- 7am, please page neurology on call as listed in [place].

## 2022-03-11 ENCOUNTER — Inpatient Hospital Stay: Payer: Medicare Other

## 2022-03-11 ENCOUNTER — Encounter: Payer: Self-pay | Admitting: Internal Medicine

## 2022-03-11 DIAGNOSIS — G9341 Metabolic encephalopathy: Secondary | ICD-10-CM | POA: Diagnosis not present

## 2022-03-11 DIAGNOSIS — B020 Zoster encephalitis: Secondary | ICD-10-CM | POA: Diagnosis not present

## 2022-03-11 DIAGNOSIS — R55 Syncope and collapse: Secondary | ICD-10-CM | POA: Diagnosis not present

## 2022-03-11 DIAGNOSIS — E871 Hypo-osmolality and hyponatremia: Secondary | ICD-10-CM | POA: Insufficient documentation

## 2022-03-11 DIAGNOSIS — I639 Cerebral infarction, unspecified: Secondary | ICD-10-CM | POA: Diagnosis not present

## 2022-03-11 LAB — BASIC METABOLIC PANEL
Anion gap: 6 (ref 5–15)
Anion gap: 6 (ref 5–15)
BUN: 5 mg/dL — ABNORMAL LOW (ref 8–23)
BUN: <5 mg/dL — ABNORMAL LOW (ref 8–23)
CO2: 21 mmol/L — ABNORMAL LOW (ref 22–32)
CO2: 22 mmol/L (ref 22–32)
Calcium: 8.4 mg/dL — ABNORMAL LOW (ref 8.9–10.3)
Calcium: 8.2 mg/dL — ABNORMAL LOW (ref 8.9–10.3)
Chloride: 102 mmol/L (ref 98–111)
Chloride: 97 mmol/L — ABNORMAL LOW (ref 98–111)
Creatinine, Ser: 0.56 mg/dL (ref 0.44–1.00)
Creatinine, Ser: 0.58 mg/dL (ref 0.44–1.00)
GFR, Estimated: >60 mL/min (ref >60)
GFR, Estimated: >60 mL/min (ref >60)
Glucose, Bld: 114 mg/dL — ABNORMAL HIGH (ref 70–99)
Glucose, Bld: 127 mg/dL — ABNORMAL HIGH (ref 70–99)
Potassium: 3.9 mmol/L (ref 3.5–5.1)
Potassium: 3.9 mmol/L (ref 3.5–5.1)
Sodium: 129 mmol/L — ABNORMAL LOW (ref 135–145)
Sodium: 125 mmol/L — ABNORMAL LOW (ref 135–145)

## 2022-03-11 LAB — CBC
HCT: 30.3 % — ABNORMAL LOW (ref 36.0–46.0)
Hemoglobin: 10.3 g/dL — ABNORMAL LOW (ref 12.0–15.0)
MCH: 30 pg (ref 26.0–34.0)
MCHC: 34 g/dL (ref 30.0–36.0)
MCV: 88.3 fL (ref 80.0–100.0)
Platelets: 237 10*3/uL (ref 150–400)
RBC: 3.43 MIL/uL — ABNORMAL LOW (ref 3.87–5.11)
RDW: 14.3 % (ref 11.5–15.5)
WBC: 9.4 10*3/uL (ref 4.0–10.5)
nRBC: 0 % (ref 0.0–0.2)

## 2022-03-11 MED ORDER — LACTULOSE 10 GM/15ML PO SOLN: 20.0000 g | Freq: Two times a day (BID) | ORAL | Status: DC | PRN

## 2022-03-11 MED ORDER — POLYETHYLENE GLYCOL 3350 17 G PO PACK: 17.0000 g | PACK | Freq: Every day | ORAL | Status: DC | PRN

## 2022-03-11 MED ORDER — SODIUM CHLORIDE 0.9 % IV SOLN: INTRAVENOUS | Status: DC

## 2022-03-11 MED ORDER — BISACODYL 10 MG RE SUPP
10.0000 mg | Freq: Once | RECTAL | Status: DC
  Filled 2022-03-11: qty 1

## 2022-03-11 NOTE — Progress Notes (Cosign Needed)
Patient is not able to walk the distance required to go the bathroom, or he/she is unable to safely negotiate stairs required to access the bathroom.  A 3in1 BSC will alleviate this problem  

## 2022-03-11 NOTE — Progress Notes (Addendum)
Progress Note   Patient: Carly Peck SWN:[id number] DOB: Jul 01, 1931 DOA: 03/08/2022     3 DOS: the patient was seen and examined on 03/11/2022   Brief hospital course: 88 y.o. female with medical history significant of hypertension, hyperlipidemia, depression with anxiety, left bundle blockade, breast cancer (s/p of radiation and chemotherapy), MGUS, tic [place], currently being treated for herpes zoster, who presents with altered mental status and syncope.   Per patient's daughter at the bedside, patient started having left-sided flank/back pain 2 weeks ago, initial workup was negative, then found to have rash breakout, therefore diagnosed as herpes zoster.  Patient was sdarted on Valtrex on Friday.  Patient was also started on Norco for pain control, but did not take Norco in the past 2 days.  In this morning, patient was found to be confused and had syncope episode.  Patient seems to have had left facial droop and left arm dropping down.   Patient has mild dry cough, no chest pain or shortness breath.  Patient does not have nausea vomiting, diarrhea or abdominal pain.  But on examination by ED physician, patient seems to have abdominal tenderness.  She denies abdominal pain to me.  Patient has burning on urination since last night.  No fever or chills. When I saw patient in the ED, patient is lethargic, but oriented x 3.  She moves all extremities.  No facial droop or slurred speech. Per report, patient had hypotension with blood pressure 70/48 which improved to 103/48 in the ED.  Patient received 600 cc normal saline by EMS and 2 L normal saline at the ED.   In ED, Found to have acute urinary retention with 700 cc urine in bladder.  Nurse attempted to put Foley catheter without success.  Consulted Dr. Bernardo Heater of urology for Foley catheter placement.  Dr. Bernardo Heater placed Foley catheter in the operating room on 03/08/2022.  Foley catheter will remain in for 10 to 14 days.  MRI of the brain  showed a punctate acute infarct in the left frontal lobe white matter.  LDL 56.  CT angio head and neck was negative for blockage.  Neurology consultation.  12/22.  Continue IV acyclovir today.  Shingles lesions starting to dry up and scab up on her back.   Assessment and Plan: * Acute stroke due to ischemia ([place]) Acute stroke in the left frontal lobe white matter.  Patient started on aspirin.  Zocor switched to Lipitor.  Neurology consult.  PT and OT consultations.  Echocardiogram negative.  CT angio head and neck did not show any blockages.  Syncope Could be secondary to stroke or medication for zoster.  Acute metabolic encephalopathy Improved.  Could be secondary to stroke or medications for zoster.  EEG negative for seizure.  Herpes zoster Continue IV acyclovir and low-dose gabapentin  Hypotension Improved.  HLD (hyperlipidemia) Switched Zocor to Lipitor.  Depression with anxiety On Cymbalta  Hypomagnesemia Replaced  Hypokalemia Replaced  Elevated lactic acid level Discontinue fluids today  Acute urinary retention Required Foley catheter placement by urology.  Catheter will need to stay in for 10 to 14 days.  Asked nursing staff to teach family how to drain Foley catheter.  Hyponatremia Likely secondary to IV fluids.  Advance diet to regular diet.  Constipation Large bowel movement this morning.  Abdominal x-ray showed distended gaseous bowel.        Subjective: Patient had a rough night.  She stated she tried calling the nursing staff 4 times and ended up  having a bowel movement in the bed.  Still feels a little weak.  Admitted with altered mental status and found to have a stroke.  Physical Exam: Vitals:   03/10/22 1621 03/10/22 2027 03/11/22 0601 03/11/22 0840  BP: 134/70 (!) 142/70 (!) 119/58 (!) 141/66  Pulse: 93 88 95 92  Resp: '18 18 20 16  '$ Temp: 97.7 F (36.5 C) 97.9 F (36.6 C) 99 F (37.2 C) 98.2 F (36.8 C)  TempSrc:    Oral  SpO2: 99%  100% 98% 98%  Weight:      Height:       Physical Exam HENT:     Head: Normocephalic.     Mouth/Throat:     Pharynx: No oropharyngeal exudate.  Eyes:     General: Lids are normal.     Conjunctiva/sclera: Conjunctivae normal.  Cardiovascular:     Rate and Rhythm: Normal rate and regular rhythm.     Heart sounds: Normal heart sounds, S1 normal and S2 normal.  Pulmonary:     Breath sounds: No decreased breath sounds, wheezing, rhonchi or rales.  Abdominal:     Palpations: Abdomen is soft.     Tenderness: There is no abdominal tenderness.  Musculoskeletal:     Right lower leg: No swelling.     Left lower leg: No swelling.  Skin:    General: Skin is warm.     Comments: Shingles rash left buttock area wrapping around into the groin.  On the back-areas are starting to dry up and form scabs  Neurological:     Mental Status: She is alert.     Comments: Answers questions appropriately.  Able to straight leg raise.     Data Reviewed: Sodium 129  Family Communication: Spoke with daughter and husband at bedside  Disposition: Status is: Inpatient Remains inpatient appropriate because: Continue IV acyclovir here today and reassess tomorrow.  Planned Discharge Destination: Home with Home Health    Time spent: 28 minutes  Author: Loletha Grayer, MD 03/11/2022 2:44 PM  For on call review [email or web address].

## 2022-03-11 NOTE — Assessment & Plan Note (Addendum)
Likely secondary to IV fluids.  Advance diet to regular diet.  Unfortunately we needed to give fluids with the iv acyclovir in the hospital.  Sodium will come up after fluids stopped.

## 2022-03-11 NOTE — Progress Notes (Signed)
CSW informed Britt Boozer, that pt will also need HH PT/OT/RN orders.  He said he needed the orders to state that.  Informed team that patient can get RN services added, however, they can't get a home healt aide through [facility].  Physician informed and asked to amend the order for PT/OT/RN.

## 2022-03-11 NOTE — Progress Notes (Signed)
Occupational Therapy Treatment Patient Details Name: Carly Peck MRN: [id number] DOB: [birth year] Today's Date: 03/11/2022   History of present illness Pt is an "88 y.o. female with medical history significant of hypertension, hyperlipidemia, depression with anxiety, left bundle blockade, breast cancer (s/p of radiation and chemotherapy), MGUS, tic doulou[place], currently being treated for herpes zoster, who presents with altered mental status and syncope."   OT comments  Upon entering the room, pt supine in bed with son and daughter present in room. Pt does endorse fatigue but is agreeable to OT intervention this session. Session focused on self care tasks with sit <>stand from EOB. Pt washing face and parts of UB with set up A. Pt standing to wash buttocks and peri area with min guard - min A without use of AD. OT did educate family members that pt is able to shower with foley at discharge and recommended being seated in shower chair for safety. OT also discussed LB dressing techniques with foley bag management. They verbalized understanding. Pt requesting to return to bed and fatigue. Min guard for sit >supine. All needs within reach at end of session.    Recommendations for follow up therapy are one component of a multi-disciplinary discharge planning process, led by the attending physician.  Recommendations may be updated based on patient status, additional functional criteria and insurance authorization.    Follow Up Recommendations  Home health OT     Assistance Recommended at Discharge Intermittent Supervision/Assistance  Patient can return home with the following  A little help with walking and/or transfers;A little help with bathing/dressing/bathroom;Help with stairs or ramp for entrance;Assist for transportation;Assistance with cooking/housework;Direct supervision/assist for financial management;Direct supervision/assist for medications management         Precautions / Restrictions  Precautions Precautions: Fall;Other (comment) Precaution Comments: active shingles Restrictions Weight Bearing Restrictions: No       Mobility Bed Mobility Overal bed mobility: Needs Assistance Bed Mobility: Supine to Sit, Sit to Supine     Supine to sit: Min guard Sit to supine: Min guard        Transfers Overall transfer level: Needs assistance Equipment used: Rolling walker (2 wheels) Transfers: Sit to/from Stand Sit to Stand: Supervision                 Balance Overall balance assessment: Needs assistance Sitting-balance support: Bilateral upper extremity supported, Feet supported Sitting balance-Leahy Scale: Fair     Standing balance support: Reliant on assistive device for balance Standing balance-Leahy Scale: Fair Standing balance comment: narrow base of support during standing                           ADL either performed or assessed with clinical judgement   ADL Overall ADL's : Needs assistance/impaired         Upper Body Bathing: Set up;Sitting   Lower Body Bathing: Minimal assistance;Sit to/from stand;Min guard                              Extremity/Trunk Assessment Upper Extremity Assessment Upper Extremity Assessment: Generalized weakness   Lower Extremity Assessment Lower Extremity Assessment: Generalized weakness        Vision Patient Visual Report: No change from baseline            Cognition Arousal/Alertness: Awake/alert Behavior During Therapy: WFL for tasks assessed/performed Overall Cognitive Status: Within Functional Limits for tasks assessed  Pertinent Vitals/ Pain       Pain Assessment Pain Assessment: Faces Faces Pain Scale: Hurts even more Pain Location: left flank/lower back Pain Descriptors / Indicators: Aching, Discomfort Pain Intervention(s): Monitored during session, Repositioned         Frequency  Min  2X/week        Progress Toward Goals  OT Goals(current goals can now be found in the care plan section)  Progress towards OT goals: Progressing toward goals  Acute Rehab OT Goals Patient Stated Goal: to get stronger and return home OT Goal Formulation: With patient/family Time For Goal Achievement: 03/23/22 Potential to Achieve Goals: [place] Discharge plan remains appropriate;Frequency remains appropriate       AM-PAC OT "6 Clicks" Daily Activity     Outcome Measure   Help from another person eating meals?: None Help from another person taking care of personal grooming?: None Help from another person toileting, which includes using toliet, bedpan, or urinal?: A Little Help from another person bathing (including washing, rinsing, drying)?: A Little Help from another person to put on and taking off regular upper body clothing?: None Help from another person to put on and taking off regular lower body clothing?: A Little 6 Click Score: 21    End of Session    OT Visit Diagnosis: Unsteadiness on feet (R26.81);Muscle weakness (generalized) (M62.81);Repeated falls (R29.6)   Activity Tolerance Patient tolerated treatment well   Patient Left in bed;with call bell/phone within reach;with bed alarm set;with family/visitor present   Nurse Communication Mobility status        Time: 1510-1535 OT Time Calculation (min): 25 min  Charges: OT General Charges $OT Visit: 1 Visit OT Treatments $Self Care/Home Management : 23-37 mins  Darleen Crocker, MS, OTR/L , CBIS ascom [phone]  03/11/22, 3:57 PM

## 2022-03-11 NOTE — Care Management Important Message (Signed)
Important Message  Patient Details  Name: Carly Peck MRN: [id number] Date of Birth: 10-31-1931   Medicare Important Message Given:  Yes     [name] 03/11/2022, 9:59 AM

## 2022-03-11 NOTE — Plan of Care (Signed)
  Problem: Education: Goal: Knowledge of General Education information will improve Description: Including pain rating scale, medication(s)/side effects and non-pharmacologic comfort measures Outcome: Progressing   Problem: Health Behavior/Discharge Planning: Goal: Ability to manage health-related needs will improve Outcome: Progressing   Problem: Clinical Measurements: Goal: Ability to maintain clinical measurements within normal limits will improve Outcome: Progressing Goal: Will remain free from infection Outcome: Progressing Goal: Diagnostic test results will improve Outcome: Progressing Goal: Respiratory complications will improve Outcome: Progressing Goal: Cardiovascular complication will be avoided Outcome: Progressing   Problem: Activity: Goal: Risk for activity intolerance will decrease Outcome: Progressing   Problem: Nutrition: Goal: Adequate nutrition will be maintained Outcome: Progressing   Problem: Coping: Goal: Level of anxiety will decrease Outcome: Progressing   Problem: Elimination: Goal: Will not experience complications related to bowel motility Outcome: Progressing Goal: Will not experience complications related to urinary retention Outcome: Progressing   Problem: Pain Managment: Goal: General experience of comfort will improve Outcome: Progressing   Problem: Safety: Goal: Ability to remain free from injury will improve Outcome: Progressing   Problem: Skin Integrity: Goal: Risk for impaired skin integrity will decrease Outcome: Progressing   Problem: Education: Goal: Knowledge of disease or condition will improve Outcome: Progressing Goal: Knowledge of secondary prevention will improve (MUST DOCUMENT ALL) Outcome: Progressing Goal: Knowledge of patient specific risk factors will improve [place] N/A or DELETE if not current risk factor) Outcome: Progressing   Problem: Ischemic Stroke/TIA Tissue Perfusion: Goal: Complications of ischemic  stroke/TIA will be minimized Outcome: Progressing   Problem: Coping: Goal: Will verbalize positive feelings about self Outcome: Progressing   Problem: Health Behavior/Discharge Planning: Goal: Ability to manage health-related needs will improve Outcome: Progressing   Problem: Self-Care: Goal: Ability to participate in self-care as condition permits will improve Outcome: Progressing Goal: Verbalization of feelings and concerns over difficulty with self-care will improve Outcome: Progressing Goal: Ability to communicate needs accurately will improve Outcome: Progressing   Problem: Nutrition: Goal: Risk of aspiration will decrease Outcome: Progressing

## 2022-03-11 NOTE — Progress Notes (Signed)
Physical Therapy Treatment Patient Details Name: Carly Peck MRN: [id number] DOB: 02/13/1932 Today's Date: 03/11/2022   History of Present Illness Pt is an "88 y.o. female with medical history significant of hypertension, hyperlipidemia, depression with anxiety, left bundle blockade, breast cancer (s/p of radiation and chemotherapy), MGUS, tic douloure[place], currently being treated for herpes zoster, who presents with altered mental status and syncope."    PT Comments    Pt received upright in recliner with family present. Pt agreeable to PT session. Pt performing 2x30' gait bouts first without AD then with RW. Without AD pt reliant on minguard assist with shuffled, very small steps with cautious gait and UE support on objects. With RW pt able to perform step through gait at supervision level with increased gait velocity. Pt returning to recliner. Pt and family educated on benefits of RW to reduce risk of falls and for improved energy conservation. Pt and family verbalizing understanding. Pt with all needs in reach. Will continue to recommend home with HHPT at discharge with family support.     Recommendations for follow up therapy are one component of a multi-disciplinary discharge planning process, led by the attending physician.  Recommendations may be updated based on patient status, additional functional criteria and insurance authorization.  Follow Up Recommendations  Home health PT     Assistance Recommended at Discharge Set up Supervision/Assistance  Patient can return home with the following A little help with walking and/or transfers;A little help with bathing/dressing/bathroom;Assistance with cooking/housework;Assist for transportation   Equipment Recommendations  Rolling walker (2 wheels)    Recommendations for Other Services Speech consult     Precautions / Restrictions Precautions Precautions: Fall;Other (comment) (air/contact) Precaution Comments: active  shingles Restrictions Weight Bearing Restrictions: No     Mobility  Bed Mobility               General bed mobility comments: NT. In recliner pre and post session Patient Response: Cooperative  Transfers Overall transfer level: Needs assistance Equipment used: Rolling walker (2 wheels) Transfers: Sit to/from Stand Sit to Stand: Supervision           General transfer comment: Good carryover with hand placement.    Ambulation/Gait Ambulation/Gait assistance: Min guard, Supervision Gait Distance (Feet): 60 Feet (2x30'. trialed no AD versus RW.) Assistive device: Rolling walker (2 wheels), None Gait Pattern/deviations: Shuffle, Narrow base of support, Trunk flexed, Step-through pattern, Decreased step length - right, Decreased step length - left       General Gait Details: trialed gait without AD. SHuffled, small step lengths, cautious gait reaching for objects in room. With RW pt initiating step through gait with improved gait velocity.   Stairs             Wheelchair Mobility    Modified Rankin (Stroke Patients Only)       Balance Overall balance assessment: Needs assistance Sitting-balance support: Bilateral upper extremity supported, Feet supported Sitting balance-Leahy Scale: Fair     Standing balance support: Reliant on assistive device for balance Standing balance-Leahy Scale: Fair                              Cognition Arousal/Alertness: Awake/alert Behavior During Therapy: WFL for tasks assessed/performed Overall Cognitive Status: Within Functional Limits for tasks assessed  Exercises Other Exercises Other Exercises: Reduced falls risk and imrpoved energy conservation with RW compared to without AD.    General Comments        Pertinent Vitals/Pain Pain Assessment Pain Assessment: Faces Faces Pain Scale: Hurts a little bit Pain Location: left flank/lower  back Pain Descriptors / Indicators: Aching, Discomfort Pain Intervention(s): Monitored during session, Limited activity within patient's tolerance    Home Living                          Prior Function            PT Goals (current goals can now be found in the care plan section) Acute Rehab PT Goals Patient Stated Goal: "to go home" PT Goal Formulation: With patient/family Time For Goal Achievement: 03/23/22 Potential to Achieve Goals: Good Progress towards PT goals: Progressing toward goals    Frequency    Min 2X/week      PT Plan      Co-evaluation              AM-PAC PT "6 Clicks" Mobility   Outcome Measure  Help needed turning from your back to your side while in a flat bed without using bedrails?: A Little Help needed moving from lying on your back to sitting on the side of a flat bed without using bedrails?: A Little Help needed moving to and from a bed to a chair (including a wheelchair)?: A Little Help needed standing up from a chair using your arms (e.g., wheelchair or bedside chair)?: A Little Help needed to walk in hospital room?: A Little Help needed climbing 3-5 steps with a railing? : A Little 6 Click Score: 18    End of Session Equipment Utilized During Treatment: Gait belt Activity Tolerance: Patient tolerated treatment well Patient left: in chair;with call bell/phone within reach;with chair alarm set;with nursing/sitter in room Nurse Communication: Mobility status PT Visit Diagnosis: Unsteadiness on feet (R26.81);Muscle weakness (generalized) (M62.81)     Time: 4097-3532 PT Time Calculation (min) (ACUTE ONLY): 25 min  Charges:  $Gait Training: 23-37 mins                     Salem Caster. Fairly IV, PT, DPT Physical Therapist- [facility]  03/11/2022, 1:05 PM

## 2022-03-12 DIAGNOSIS — B020 Zoster encephalitis: Secondary | ICD-10-CM | POA: Diagnosis not present

## 2022-03-12 DIAGNOSIS — G9341 Metabolic encephalopathy: Secondary | ICD-10-CM | POA: Diagnosis not present

## 2022-03-12 DIAGNOSIS — E871 Hypo-osmolality and hyponatremia: Secondary | ICD-10-CM

## 2022-03-12 DIAGNOSIS — I639 Cerebral infarction, unspecified: Secondary | ICD-10-CM | POA: Diagnosis not present

## 2022-03-12 DIAGNOSIS — R55 Syncope and collapse: Secondary | ICD-10-CM | POA: Diagnosis not present

## 2022-03-12 LAB — RESP PANEL BY RT-PCR (RSV, FLU A&B, COVID)  RVPGX2
Influenza A by PCR: NEGATIVE
Influenza B by PCR: NEGATIVE
Resp Syncytial Virus by PCR: NEGATIVE
SARS Coronavirus 2 by RT PCR: NEGATIVE

## 2022-03-12 MED ORDER — LACTULOSE 10 GM/15ML PO SOLN: 20.0000 g | Freq: Two times a day (BID) | ORAL | 0 refills | Status: DC | PRN

## 2022-03-12 MED ORDER — PANTOPRAZOLE SODIUM 40 MG PO TBEC: 40.0000 mg | DELAYED_RELEASE_TABLET | Freq: Every day | ORAL | 0 refills | Status: DC

## 2022-03-12 MED ORDER — ATORVASTATIN CALCIUM 40 MG PO TABS: 40.0000 mg | ORAL_TABLET | Freq: Every evening | ORAL | 0 refills | Status: AC

## 2022-03-12 MED ORDER — CLOPIDOGREL BISULFATE 75 MG PO TABS: 75.0000 mg | ORAL_TABLET | Freq: Every day | ORAL | 0 refills | Status: DC

## 2022-03-12 MED ORDER — ASPIRIN 81 MG PO TBEC: 81.0000 mg | DELAYED_RELEASE_TABLET | Freq: Every day | ORAL | 0 refills | Status: AC

## 2022-03-12 MED ORDER — FLUTICASONE PROPIONATE 50 MCG/ACT NA SUSP
2.0000 | Freq: Every day | NASAL | Status: DC
  Filled 2022-03-12: qty 16

## 2022-03-12 NOTE — Discharge Instructions (Signed)
Switched zocor (simvastatin) to lipitor (atorvastatin) Switched omeprazole to protonix (omeprazole interact with plavix). Aspirin indefinitely Plavix for another 19 days

## 2022-03-12 NOTE — Discharge Summary (Signed)
Physician Discharge Summary   Patient: Carly Peck MRN: [id number] DOB: June 21, 1931  Admit date:     03/08/2022  Discharge date: 03/12/22  Discharge Physician: Loletha Grayer   PCP: Kirk Ruths, MD   Recommendations at discharge:   Follow-up PCP 5 days Will need referral from PCP to get to neurologist in their practice.  Discharge Diagnoses: Principal Problem:   Acute stroke due to ischemia [facility]) Active Problems:   Syncope   Acute metabolic encephalopathy   Herpes zoster   Hypotension   Benign essential HTN   HLD (hyperlipidemia)   Depression with anxiety   Hypokalemia   Hypomagnesemia   Elevated lactic acid level   Acute urinary retention   Constipation   Altered mental status   Hyponatremia   Hospital Course: 88 y.o. female with medical history significant of hypertension, hyperlipidemia, depression with anxiety, left bundle blockade, breast cancer (s/p of radiation and chemotherapy), MGUS, tic [place], currently being treated for herpes zoster, who presents with altered mental status and syncope.   Per patient's daughter at the bedside, patient started having left-sided flank/back pain 2 weeks ago, initial workup was negative, then found to have rash breakout, therefore diagnosed as herpes zoster.  Patient was sdarted on Valtrex on Friday.  Patient was also started on Norco for pain control, but did not take Norco in the past 2 days.  In this morning, patient was found to be confused and had syncope episode.  Patient seems to have had left facial droop and left arm dropping down.   Patient has mild dry cough, no chest pain or shortness breath.  Patient does not have nausea vomiting, diarrhea or abdominal pain.  But on examination by ED physician, patient seems to have abdominal tenderness.  She denies abdominal pain to me.  Patient has burning on urination since last night.  No fever or chills. When I saw patient in the ED, patient is lethargic, but oriented x  3.  She moves all extremities.  No facial droop or slurred speech. Per report, patient had hypotension with blood pressure 70/48 which improved to 103/48 in the ED.  Patient received 600 cc normal saline by EMS and 2 L normal saline at the ED.   In ED, Found to have acute urinary retention with 700 cc urine in bladder.  Nurse attempted to put Foley catheter without success.  Consulted Dr. Bernardo Heater of urology for Foley catheter placement.  Dr. Bernardo Heater placed Foley catheter in the operating room on 03/08/2022.  Foley catheter will remain in for 10 to 14 days.  MRI of the brain showed a punctate acute infarct in the left frontal lobe white matter.  LDL 56.  CT angio head and neck was negative for blockage.  Neurology consultation.  12/22.  Continue IV acyclovir today.  Shingles lesions starting to dry up and scab up on her back.  12/23.  Shingles lesions continuing to dry up.  Will complete IV acyclovir today.  Sodium dropping lower secondary to IV fluids which were required to be given while giving IV acyclovir.  This will improve upon disposition.  Assessment and Plan: * Acute stroke due to ischemia ([place]) Acute stroke in the left frontal lobe white matter.  Patient started on aspirin and should be continued indefinitely.  Zocor switched to Lipitor.  Plavix for another 19 days then can stop.  Echocardiogram negative.  CT angio head and neck did not show any blockages.  Neurology referral by PCP (neurologist in PCPs office).  Syncope Could be secondary to stroke or medication for zoster.  Acute metabolic encephalopathy Improved.  Could be secondary to stroke or medications for zoster.  EEG negative for seizure.  Herpes zoster Completed 7 days total of medications for zoster.  Received IV acyclovir here in the hospital.  Patient and family are okay with 7 days treatment and do not want to extend course to 10 days.  Continue low-dose gabapentin.  Lesions are starting to dry  up.  Hypotension Improved.  HLD (hyperlipidemia) Switched Zocor to Lipitor.  Depression with anxiety On Cymbalta  Hypomagnesemia Replaced  Hypokalemia Replaced  Elevated lactic acid level Patient received fluids during the hospital course  Acute urinary retention Required Foley catheter placement in the operating room by urology.  Catheter will need to stay in for 10 to 14 days.  Asked nursing staff to teach family how to drain Foley catheter.  Follow-up with urology for voiding trial.  Hyponatremia Likely secondary to IV fluids.  Advance diet to regular diet.  Unfortunately we needed to give fluids with the iv acyclovir in the hospital.  Sodium will come up after fluids stopped.  Constipation Large bowel movement yesterday.  Abdominal x-ray showed distended gaseous bowel.         Consultants: Neurology Procedures performed: None Disposition: Home health Diet recommendation:  Regular diet DISCHARGE MEDICATION: Allergies as of 03/12/2022       Reactions   Adhesive [tape] Other (See Comments)   Red mark, Please use "paper" tape        Medication List     STOP taking these medications    baclofen 10 MG tablet Commonly known as: LIORESAL   losartan 50 MG tablet Commonly known as: COZAAR   omeprazole 20 MG capsule Commonly known as: PRILOSEC Replaced by: pantoprazole 40 MG tablet   simvastatin 40 MG tablet Commonly known as: ZOCOR   spironolactone 25 MG tablet Commonly known as: ALDACTONE   valACYclovir 1000 MG tablet Commonly known as: Valtrex       TAKE these medications    acetaminophen 500 MG tablet Commonly known as: TYLENOL Take 1,000 mg by mouth 2 (two) times daily as needed for mild pain or headache.   aspirin EC 81 MG tablet Take 1 tablet (81 mg total) by mouth daily. Swallow whole.   atorvastatin 40 MG tablet Commonly known as: LIPITOR Take 1 tablet (40 mg total) by mouth every evening.   Azelastine HCl 137 MCG/SPRAY  Soln SMARTSIG:1 Spray(s) Both Nares Every 12 Hours PRN   carvedilol 12.5 MG tablet Commonly known as: COREG Take 12.5 mg by mouth 2 (two) times daily with a meal.   cetirizine 10 MG tablet Commonly known as: ZYRTEC Take 10 mg by mouth daily.   clobetasol ointment 0.05 % Commonly known as: TEMOVATE APPLY TO AFFECTED AREA DAILY AS NEEDED for rash on buttocks   clopidogrel 75 MG tablet Commonly known as: PLAVIX Take 1 tablet (75 mg total) by mouth daily.   DULoxetine 30 MG capsule Commonly known as: CYMBALTA Take 30 mg by mouth daily.   fluticasone 50 MCG/ACT nasal spray Commonly known as: FLONASE Place 2 sprays into both nostrils 2 (two) times daily as needed for rhinitis.   gabapentin 100 MG capsule Commonly known as: Neurontin Take 1 capsule (100 mg total) by mouth 3 (three) times daily. What changed: Another medication with the same name was removed. Continue taking this medication, and follow the directions you see here.   lactulose 10 GM/15ML solution Commonly  known as: CHRONULAC Take 30 mLs (20 g total) by mouth 2 (two) times daily as needed for severe constipation.   pantoprazole 40 MG tablet Commonly known as: PROTONIX Take 1 tablet (40 mg total) by mouth daily. Replaces: omeprazole 20 MG capsule   sennosides-docusate sodium 8.6-50 MG tablet Commonly known as: SENOKOT-S Take 4 tablets by mouth at bedtime.   Vitamin B-12 1000 MCG Subl Place under the tongue.   Vitamin D3 50 MCG (2000 UT) Tabs Take 2,000 Units by mouth daily.               Durable Medical Equipment  (From admission, onward)           Start     Ordered   03/11/22 1202  For home use only DME Bedside commode  Once       Question:  Patient needs a bedside commode to treat with the following condition  Answer:  Unsteady gait when walking   03/11/22 1201   03/11/22 1202  For home use only DME 4 wheeled rolling walker with seat  Once       Question:  Patient needs a walker to treat  with the following condition  Answer:  Unsteady gait when walking   03/11/22 1201            Follow-up Information     Kirk Ruths, MD Follow up in 5 day(s).   Specialty: Internal Medicine Contact information: [place] [address] [phone]         Abbie Sons, MD Follow up in 10 day(s).   Specialty: Urology Why: voiding trial Contact information: [address] [place] [address] [phone]                Discharge Exam: Danley Danker Weights   03/08/22 2106  Weight: 71.8 kg   Physical Exam HENT:     Head: Normocephalic.     Mouth/Throat:     Pharynx: No oropharyngeal exudate.  Eyes:     General: Lids are normal.     Conjunctiva/sclera: Conjunctivae normal.  Cardiovascular:     Rate and Rhythm: Normal rate and regular rhythm.     Heart sounds: Normal heart sounds, S1 normal and S2 normal.  Pulmonary:     Breath sounds: No decreased breath sounds, wheezing, rhonchi or rales.  Abdominal:     Palpations: Abdomen is soft.     Tenderness: There is no abdominal tenderness.  Musculoskeletal:     Right lower leg: No swelling.     Left lower leg: No swelling.  Skin:    General: Skin is warm.     Comments: Shingles rash left back/ buttock area wrapping around into the groin.  On the back-areas are starting to dry up and form scabs  Neurological:     Mental Status: She is alert.     Comments: Answers questions appropriately.  Able to straight leg raise.      Condition at discharge: stable  The results of significant diagnostics from this hospitalization (including imaging, microbiology, ancillary and laboratory) are listed below for reference.   Imaging Studies: DG Abd 2 Views  Result Date: 03/11/2022 CLINICAL DATA:  Constipation for or EXAM: ABDOMEN - 2 VIEW COMPARISON:  None Available. FINDINGS: Bibasilar atelectasis in the lung bases. The bowel gas pattern is normal. Gaseous distention of the  multiple bowel loops. No significant stool burden to suggest constipation. There is no evidence of free air. No  radio-opaque calculi or other significant radiographic abnormality is seen. Degenerate disc disease and vertebral body augmentation of the L1. Bilateral hip osteoarthritis. IMPRESSION: Gaseous distention of the multiple bowel loops. No evidence of bowel obstruction. No significant stool burden to suggest constipation. Electronically Signed   By: Keane Police D.O.   On: 03/11/2022 10:18   EEG adult  Result Date: 03/10/2022 Derek Jack, MD     03/10/2022  2:08 PM Routine EEG Report Bassy SHAYNNA HUSBY is a 88 y.o. female with a history of altered mental status who is undergoing an EEG to evaluate for seizures. Report: This EEG was acquired with electrodes placed according to the International 10-20 electrode system (including Fp1, Fp2, F3, F4, C3, C4, P3, P4, O1, O2, T3, T4, T5, T6, A1, A2, Fz, Cz, Pz). The following electrodes were missing or displaced: none. The occipital dominant rhythm was 6-7 Hz. This activity is reactive to stimulation. Drowsiness was manifested by background fragmentation; deeper stages of sleep were identified by K complexes and sleep spindles. There was no focal slowing. There were no interictal epileptiform discharges. There were no electrographic seizures identified. Photic stimulation and hyperventilation were not performed. Impression and clinical correlation: This EEG was obtained while awake and asleep and is abnormal due to mild diffuse slowing indicative of global cerebral dysfunction. Epileptiform abnormalities were not seen during this recording. Su Monks, MD [facility] [phone] If 7pm- 7am, please page neurology on call as listed in [place].   ECHOCARDIOGRAM COMPLETE  Result Date: 03/10/2022    ECHOCARDIOGRAM REPORT   Patient Name:   SHAWNDELL VARAS Konczal Date of Exam: 03/10/2022 Medical Rec #:  [id number]     Height:       64.0 in Accession #:     [id number]    Weight:       158.3 lb Date of Birth:  12/11/1931      BSA:          1.771 m Patient Age:    32 years      BP:           134/60 mmHg Patient Gender: F             HR:           95 bpm. Exam Location:  [facility] Procedure: 2D Echo, Cardiac Doppler and Color Doppler Indications:     Stroke I63.9  History:         Patient has prior history of Echocardiogram examinations, most                  recent 01/21/2015. Risk Factors:Hypertension. Breast cancer.  Sonographer:     Sherrie Sport Referring Phys:  [id number] Loletha Grayer Diagnosing Phys: Neoma Laming  Sonographer Comments: Suboptimal apical window. Image acquisition challenging due to mastectomy. IMPRESSIONS  1. Left ventricular ejection fraction, by estimation, is 50 to 55%. The left ventricle has low normal function. The left ventricle has no regional wall motion abnormalities. There is mild concentric left ventricular hypertrophy. Left ventricular diastolic parameters are consistent with Grade I diastolic dysfunction (impaired relaxation).  2. Right ventricular systolic function is normal. The right ventricular size is normal.  3. The mitral valve is normal in structure. Mild mitral valve regurgitation. No evidence of mitral stenosis.  4. The aortic valve is calcified. Aortic valve regurgitation is mild. Aortic valve sclerosis/calcification is present, without any evidence of aortic stenosis.  5. The inferior vena cava is normal in size with greater than 50% respiratory variability,  suggesting right atrial pressure of 3 mmHg. FINDINGS  Left Ventricle: Left ventricular ejection fraction, by estimation, is 50 to 55%. The left ventricle has low normal function. The left ventricle has no regional wall motion abnormalities. The left ventricular internal cavity size was normal in size. There is mild concentric left ventricular hypertrophy. Left ventricular diastolic parameters are consistent with Grade I diastolic dysfunction (impaired relaxation). Right Ventricle:  The right ventricular size is normal. No increase in right ventricular wall thickness. Right ventricular systolic function is normal. Left Atrium: Left atrial size was normal in size. Right Atrium: Right atrial size was normal in size. Pericardium: There is no evidence of pericardial effusion. Mitral Valve: The mitral valve is normal in structure. Mild mitral valve regurgitation. No evidence of mitral valve stenosis. Tricuspid Valve: The tricuspid valve is normal in structure. Tricuspid valve regurgitation is mild . No evidence of tricuspid stenosis. Aortic Valve: The aortic valve is calcified. Aortic valve regurgitation is mild. Aortic valve sclerosis/calcification is present, without any evidence of aortic stenosis. Aortic valve mean gradient measures 4.0 mmHg. Aortic valve peak gradient measures 8.0 mmHg. Aortic valve area, by VTI measures 3.00 cm. Pulmonic Valve: The pulmonic valve was normal in structure. Pulmonic valve regurgitation is not visualized. No evidence of pulmonic stenosis. Aorta: The aortic root is normal in size and structure. Venous: The inferior vena cava is normal in size with greater than 50% respiratory variability, suggesting right atrial pressure of 3 mmHg. IAS/Shunts: No atrial level shunt detected by color flow Doppler.  LEFT VENTRICLE PLAX 2D LVIDd:         3.60 cm   Diastology LVIDs:         2.60 cm   LV e' medial:    6.96 cm/s LV PW:         1.00 cm   LV E/e' medial:  12.4 LV IVS:        1.00 cm   LV e' lateral:   10.40 cm/s LVOT diam:     2.00 cm   LV E/e' lateral: 8.3 LV SV:         67 LV SV Index:   38 LVOT Area:     3.14 cm  RIGHT VENTRICLE RV S prime:     16.10 cm/s TAPSE (M-mode): 2.5 cm LEFT ATRIUM             Index        RIGHT ATRIUM           Index LA diam:        3.90 cm 2.20 cm/m   RA Area:     15.00 cm LA Vol (A2C):   30.0 ml 16.94 ml/m  RA Volume:   37.20 ml  21.00 ml/m LA Vol (A4C):   16.2 ml 9.15 ml/m LA Biplane Vol: 23.8 ml 13.44 ml/m  AORTIC VALVE AV Area  (Vmax):    2.13 cm AV Area (Vmean):   2.10 cm AV Area (VTI):     3.00 cm AV Vmax:           141.00 cm/s AV Vmean:          96.800 cm/s AV VTI:            0.222 m AV Peak Grad:      8.0 mmHg AV Mean Grad:      4.0 mmHg LVOT Vmax:         95.80 cm/s LVOT Vmean:        64.700  cm/s LVOT VTI:          0.212 m LVOT/AV VTI ratio: 0.95  AORTA Ao Root diam: 2.80 cm MITRAL VALVE                TRICUSPID VALVE MV Area (PHT): 6.12 cm     TR Peak grad:   31.6 mmHg MV Decel Time: 124 msec     TR Vmax:        281.00 cm/s MV E velocity: 86.50 cm/s MV A velocity: 118.00 cm/s  SHUNTS MV E/A ratio:  0.73         Systemic VTI:  0.21 m                             Systemic Diam: 2.00 cm Neoma Laming Electronically signed by Neoma Laming Signature Date/Time: 03/10/2022/11:29:52 AM    Final    CT ANGIO HEAD NECK W WO CM  Result Date: 03/09/2022 CLINICAL DATA:  TIA.  Altered mental status EXAM: CT ANGIOGRAPHY HEAD AND NECK TECHNIQUE: Multidetector CT imaging of the head and neck was performed using the standard protocol during bolus administration of intravenous contrast. Multiplanar CT image reconstructions and MIPs were obtained to evaluate the vascular anatomy. Carotid stenosis measurements (when applicable) are obtained utilizing NASCET criteria, using the distal internal carotid diameter as the denominator. RADIATION DOSE REDUCTION: This exam was performed according to the departmental dose-optimization program which includes automated exposure control, adjustment of the mA and/or kV according to patient size and/or use of iterative reconstruction technique. CONTRAST:  17m OMNIPAQUE IOHEXOL 350 MG/ML SOLN COMPARISON:  MRI from earlier today. FINDINGS: CTA NECK FINDINGS Aortic arch: Atherosclerosis including mural thrombus at the isthmus. Right carotid system: Limited atheromatous calcification for age. No stenosis or ulceration Left carotid system: Limited bifurcation atherosclerosis for age. No stenosis or ulceration  Vertebral arteries: Proximal subclavian atherosclerosis but no flow reducing stenosis. The vertebral arteries are tortuous but widely patent to the dura. Skeleton: Left clavicle radiation changes noted below. Ordinary cervical spine degeneration. Other neck: No acute finding Upper chest: Loss of fat in the left supraclavicular fossa and deep axilla, possibly radiation changes although a different appearance than in 2016 when there had already been mastectomy and apical radiation changes. Radiation changes are seen at the left apical lung and in the left clavicle. Review of the MIP images confirms the above findings CTA HEAD FINDINGS Anterior circulation: Mild atheromatous calcification along the carotid siphons. No branch occlusion, beading, or proximal flow reducing stenosis. Negative for aneurysm Posterior circulation: The vertebral and basilar arteries are smoothly contoured and diffusely patent. No branch occlusion, beading, or aneurysm Venous sinuses: Unremarkable Anatomic variants: None significant Review of the MIP images confirms the above findings IMPRESSION: 1. No emergent vascular finding. Mild for age atherosclerosis in the head and neck without proximal stenosis or embolic source to explain the patient's infarct. 2. Fat infiltration in the left supraclavicular fossa and deep axilla, possibly scarring from radiotherapy but new since 01/23/2015 chest CT, correlate with disease status and consider dedicated staging CT. Electronically Signed   By: JJorje GuildM.D.   On: 03/09/2022 09:51   MR BRAIN W WO CONTRAST  Result Date: 03/09/2022 CLINICAL DATA:  Altered mental status EXAM: MRI HEAD WITHOUT AND WITH CONTRAST TECHNIQUE: Multiplanar, multiecho pulse sequences of the brain and surrounding structures were obtained without and with intravenous contrast. CONTRAST:  64mGADAVIST GADOBUTROL 1 MMOL/ML IV SOLN COMPARISON:  12/03/2014  MRI brain, correlation is made with 03/08/2022 CT head FINDINGS:  Brain: Punctate focus of restricted diffusion with ADC correlate in the left frontal lobe white matter (series 5, image 41). No abnormal parenchymal or meningeal enhancement. No acute hemorrhage, mass, mass effect, or midline shift. No hydrocephalus or extra-axial collection. Cerebral volume is within normal limits for age. T2 hyperintense signal in the periventricular white matter, likely the sequela of mild-to-moderate chronic small vessel ischemic disease. Small amount of hemosiderin deposition in the medial left occipital sulci (series 13, image 20), possibly sequela of remote subarachnoid hemorrhage. Vascular: Normal arterial flow voids. Skull and upper cervical spine: Normal marrow signal. Sinuses/Orbits: Clear paranasal sinuses. Status post bilateral lens replacements. Other: The mastoids are well aerated. IMPRESSION: Punctate acute infarct in the left frontal lobe white matter. These results will be called to the ordering clinician or representative by the Radiologist Assistant, and communication documented in the PACS or [employer]. Electronically Signed   By: Merilyn Baba M.D.   On: 03/09/2022 00:27   CT Head Wo Contrast  Result Date: 03/08/2022 CLINICAL DATA:  Altered mental status. Syncopal episode during which the patient lower herself to the ground without falling. EXAM: CT HEAD WITHOUT CONTRAST TECHNIQUE: Contiguous axial images were obtained from the base of the skull through the vertex without intravenous contrast. RADIATION DOSE REDUCTION: This exam was performed according to the departmental dose-optimization program which includes automated exposure control, adjustment of the mA and/or kV according to patient size and/or use of iterative reconstruction technique. COMPARISON:  02/02/2014 FINDINGS: Brain: No significant change in mild-to-moderate enlargement of the ventricles and subarachnoid spaces. Moderate patchy white matter low density in both cerebral hemispheres with significant  progression. No intracranial hemorrhage, mass lesion or CT evidence of acute infarction. Vascular: No hyperdense vessel or unexpected calcification. Skull: Normal. Negative for fracture or focal lesion. Sinuses/Orbits: Status post bilateral cataract extraction. Unremarkable bones and included paranasal sinuses. Other: None. IMPRESSION: 1. No acute abnormality. 2. Stable mild-to-moderate diffuse cerebral and cerebellar atrophy. 3. Moderate chronic small vessel white matter ischemic changes in both cerebral hemispheres with significant progression since 2015. Electronically Signed   By: Claudie Revering M.D.   On: 03/08/2022 13:51   CT ABDOMEN PELVIS W CONTRAST  Result Date: 03/08/2022 CLINICAL DATA:  Acute, non localized epigastric abdominal pain. Syncopal episode during which the patient lower herself to the ground without falling. EXAM: CT ABDOMEN AND PELVIS WITH CONTRAST TECHNIQUE: Multidetector CT imaging of the abdomen and pelvis was performed using the standard protocol following bolus administration of intravenous contrast. RADIATION DOSE REDUCTION: This exam was performed according to the departmental dose-optimization program which includes automated exposure control, adjustment of the mA and/or kV according to patient size and/or use of iterative reconstruction technique. CONTRAST:  146m OMNIPAQUE IOHEXOL 300 MG/ML  SOLN COMPARISON:  02/26/2022 FINDINGS: Lower chest: Normal sized heart. Mild atelectasis and/or linear scarring at both lung bases. Hepatobiliary: Diffuse low density of the liver. Distended gallbladder without visible gallstones, wall thickening or pericholecystic fluid. No biliary ductal dilatation. Pancreas: Moderate to marked diffuse pancreatic atrophy. Spleen: Normal in size without focal abnormality. Adrenals/Urinary Tract: Normal-appearing adrenal glands. Small bilateral renal cysts. These do not need imaging follow-up. Unremarkable ureters and urinary bladder. Stomach/Bowel: Stomach is  within normal limits. Appendix appears normal. No evidence of bowel wall thickening, distention, or inflammatory changes. Vascular/Lymphatic: Atheromatous arterial calcifications without aneurysm. No enlarged lymph nodes. Reproductive: Uterus and bilateral adnexa are unremarkable. Other: No abdominal wall hernia or abnormality. No abdominopelvic  ascites. Musculoskeletal: Lumbar and lower thoracic spine degenerative changes. Stable L1 vertebral body compression deformity and kyphoplasty material and mild L2 superior endplate compression deformity with sclerosis. No acute fracture lines seen. Stable grade 1 retrolisthesis at the L1-2 and L2-3 levels and grade 1 anterolisthesis at the L3-4 and L4-5 levels. Stable minimal retrolisthesis at the L5-S1 level. No pars defects seen. No acute fractures. IMPRESSION: 1. No acute abnormality. 2. Diffuse hepatic steatosis. Electronically Signed   By: Claudie Revering M.D.   On: 03/08/2022 13:48   DG Chest Port 1 View  Result Date: 03/08/2022 CLINICAL DATA:  Sepsis EXAM: PORTABLE CHEST 1 VIEW COMPARISON:  05/09/2020 FINDINGS: Heart and mediastinal contours are within normal limits. No focal opacities or effusions. No acute bony abnormality. Surgical clips in the left axilla. Degenerative changes in the shoulders. IMPRESSION: No active cardiopulmonary disease. Electronically Signed   By: Rolm Baptise M.D.   On: 03/08/2022 13:11   MR Lumbar Spine W Wo Contrast  Result Date: 03/04/2022 CLINICAL DATA:  Left lumbar pain EXAM: MRI LUMBAR SPINE WITHOUT AND WITH CONTRAST TECHNIQUE: Multiplanar and multiecho pulse sequences of the lumbar spine were obtained without and with intravenous contrast. CONTRAST:  76m GADAVIST GADOBUTROL 1 MMOL/ML IV SOLN COMPARISON:  05/13/2014 FINDINGS: Segmentation:  Standard Alignment: Grade 1 retrolisthesis at L1-2 and L2-3 with grade 1 anterolisthesis at L4-5. Grade 1 retrolisthesis at L5-S1. Vertebrae: Progression of height loss at the site of L1  compression deformity. Mild wedge compression deformity of L2 is new since 05/13/2014 but there is no bone marrow edema. Conus medullaris and cauda equina: Conus extends to the L1 level. Conus and cauda equina appear normal. Paraspinal and other soft tissues: Negative Disc levels: T10-11: Normal. T11-12: Minimal disc bulge without stenosis. T12-L1: Small disc bulge with endplate spurring, slightly worsened. Mild spinal canal stenosis. L1-L2: Progression of disc space loss with endplate ridging. Mild spinal canal stenosis. Severe right and mild left neural foraminal stenosis. L2-L3: Small disc bulge. Slightly worsened mild spinal canal stenosis. No neural foraminal stenosis. L3-L4: Unchanged disc bulge and moderate facet hypertrophy. Unchanged moderate spinal canal stenosis. Mild bilateral neural foraminal stenosis. L4-L5: Unchanged severe facet arthrosis with mild worsening of disc bulge. Progression of severe spinal canal stenosis. Unchanged mild bilateral neural foraminal stenosis. L5-S1: Unchanged medium-sized right subarticular disc protrusion in close proximity to the right S1 nerve root in the lateral recess. No central spinal canal stenosis. Mild left neural foraminal stenosis. Visualized sacrum: Normal. IMPRESSION: 1. Progression of severe spinal canal stenosis at L4-L5 due to combination of disc bulge and facet arthrosis. 2. Progressed L1 and L2 height loss with worsened severe right L1-2 neural foraminal stenosis. 3. Unchanged moderate L3-4 spinal canal stenosis. 4. Unchanged medium-sized right subarticular disc protrusion at L5-S1 in close proximity to the right S1 nerve root in the lateral recess. Electronically Signed   By: KUlyses JarredM.D.   On: 03/04/2022 01:16   CT Abdomen Pelvis W Contrast  Result Date: 02/26/2022 CLINICAL DATA:  Left lower quadrant abdominal pain. EXAM: CT ABDOMEN AND PELVIS WITH CONTRAST TECHNIQUE: Multidetector CT imaging of the abdomen and pelvis was performed using the  standard protocol following bolus administration of intravenous contrast. RADIATION DOSE REDUCTION: This exam was performed according to the departmental dose-optimization program which includes automated exposure control, adjustment of the mA and/or kV according to patient size and/or use of iterative reconstruction technique. CONTRAST:  1053mOMNIPAQUE IOHEXOL 300 MG/ML  SOLN COMPARISON:  11/11/2020. FINDINGS: Lower chest: No acute abnormality.  Hepatobiliary: No focal liver abnormality is seen. No gallstones, gallbladder wall thickening, or biliary dilatation. Pancreas: Pancreatic atrophy.  No mass.  No inflammation. Spleen: Normal in size without focal abnormality. Adrenals/Urinary Tract: Normal adrenal glands. Kidneys normal in overall size, orientation and position with symmetric enhancement and excretion. Several subcentimeter low-attenuation renal lesions consistent with cysts. No follow-up recommended. No stones. No hydronephrosis. Normal ureters. Normal bladder. Stomach/Bowel: Small hiatal hernia. Stomach otherwise unremarkable. Small bowel and colon are normal in caliber. No wall thickening. No inflammation. There are few diverticula along the sigmoid colon. No diverticulitis. Normal appendix visualized. Vascular/Lymphatic: Aortic atherosclerosis. No aneurysm. Mildly enlarged gastrohepatic ligament lymph node, 1.2 cm short axis, stable from the prior CT consistent with a benign reactive node. Reproductive: Uterus and bilateral adnexa are unremarkable. Other: No abdominal wall hernia or abnormality. No abdominopelvic ascites. Musculoskeletal: Chronic compression fracture of L1, previously treated with vertebroplasty. No acute fracture. Sclerotic and cystic changes along the right pubic symphysis and adjacent pubis, which may reflect old trauma, limited Paget's disease or an area of fibrous dysplasia. No aggressive/suspicious bone lesions. IMPRESSION: 1. No acute findings within the abdomen or pelvis. No  findings to account for left lower quadrant abdominal pain. 2. Aortic atherosclerosis. Electronically Signed   By: Lajean Manes M.D.   On: 02/26/2022 11:36    Microbiology: Results for orders placed or performed during the hospital encounter of 03/08/22  Culture, blood (Routine x 2)     Status: None (Preliminary result)   Collection Time: 03/08/22 12:39 PM   Specimen: BLOOD  Result Value Ref Range Status   Specimen Description BLOOD  RIGHT FOREARM  Final   Special Requests   Final    BOTTLES DRAWN AEROBIC AND ANAEROBIC Blood Culture results may not be optimal due to an inadequate volume of blood received in culture bottles   Culture   Final    NO GROWTH 4 DAYS Performed at [facility], [address] [place], [place] [address]    Report Status PENDING  Incomplete  Culture, blood (Routine x 2)     Status: None (Preliminary result)   Collection Time: 03/08/22 12:40 PM   Specimen: BLOOD  Result Value Ref Range Status   Specimen Description BLOOD  LEFT FOREARM  Final   Special Requests   Final    BOTTLES DRAWN AEROBIC AND ANAEROBIC Blood Culture results may not be optimal due to an inadequate volume of blood received in culture bottles   Culture   Final    NO GROWTH 4 DAYS Performed at [facility], [address] [place], [place] [address]    Report Status PENDING  Incomplete  Urine Culture     Status: None   Collection Time: 03/08/22  7:18 PM   Specimen: Urine, Random  Result Value Ref Range Status   Specimen Description   Final    URINE, RANDOM Performed at [facility], [address], [place], [place] [address]    Special Requests   Final    NONE Performed at [facility], [address], [place], [place] [address]    Culture   Final    NO GROWTH Performed at [facility], [place] [address] [place],  [address]    Report Status 03/10/2022 FINAL  Final  Resp panel by RT-PCR (RSV, Flu A&B, Covid) Anterior Nasal Swab      Status: None   Collection Time: 03/12/22  8:26 AM   Specimen: Anterior Nasal Swab  Result  Value Ref Range Status   SARS Coronavirus 2 by RT PCR NEGATIVE NEGATIVE Final    Comment: (NOTE) SARS-CoV-2 target nucleic acids are NOT DETECTED.  The SARS-CoV-2 RNA is generally detectable in upper respiratory specimens during the acute phase of infection. The lowest concentration of SARS-CoV-2 viral copies this assay can detect is 138 copies/mL. A negative result does not preclude SARS-Cov-2 infection and should not be used as the sole basis for treatment or other patient management decisions. A negative result may occur with  improper specimen collection/handling, submission of specimen other than nasopharyngeal swab, presence of viral mutation(s) within the areas targeted by this assay, and inadequate number of viral copies(<138 copies/mL). A negative result must be combined with clinical observations, patient history, and epidemiological information. The expected result is Negative.  Fact Sheet for Patients:  EntrepreneurPulse.com.au  Fact Sheet for Healthcare Providers:  IncredibleEmployment.be  This test is no t yet approved or cleared by the [place] FDA and  has been authorized for detection and/or diagnosis of SARS-CoV-2 by FDA under an Emergency Use Authorization (EUA). This EUA will remain  in effect (meaning this test can be used) for the duration of the COVID-19 declaration under Section 564(b)(1) of the Act, 21 U.S.C.section 360bbb-3(b)(1), unless the authorization is terminated  or revoked sooner.       Influenza A by PCR NEGATIVE NEGATIVE Final   Influenza B by PCR NEGATIVE NEGATIVE Final    Comment: (NOTE) The Xpert Xpress SARS-CoV-2/FLU/RSV plus assay is intended as an aid in the diagnosis of influenza from Nasopharyngeal swab specimens and should not be used as a sole basis for treatment. Nasal washings and aspirates are  unacceptable for Xpert Xpress SARS-CoV-2/FLU/RSV testing.  Fact Sheet for Patients: EntrepreneurPulse.com.au  Fact Sheet for Healthcare Providers: IncredibleEmployment.be  This test is not yet approved or cleared by the [place] FDA and has been authorized for detection and/or diagnosis of SARS-CoV-2 by FDA under an Emergency Use Authorization (EUA). This EUA will remain in effect (meaning this test can be used) for the duration of the COVID-19 declaration under Section 564(b)(1) of the Act, 21 U.S.C. section 360bbb-3(b)(1), unless the authorization is terminated or revoked.     Resp Syncytial Virus by PCR NEGATIVE NEGATIVE Final    Comment: (NOTE) Fact Sheet for Patients: EntrepreneurPulse.com.au  Fact Sheet for Healthcare Providers: IncredibleEmployment.be  This test is not yet approved or cleared by the [place] FDA and has been authorized for detection and/or diagnosis of SARS-CoV-2 by FDA under an Emergency Use Authorization (EUA). This EUA will remain in effect (meaning this test can be used) for the duration of the COVID-19 declaration under Section 564(b)(1) of the Act, 21 U.S.C. section 360bbb-3(b)(1), unless the authorization is terminated or revoked.  Performed at [facility], [address] [place], [place] [address]     Labs: CBC: Recent Labs  Lab 03/08/22 1227 03/09/22 0405 03/11/22 0300  WBC 10.6* 9.5 9.4  NEUTROABS 9.0*  --   --   HGB 10.3* 10.3* 10.3*  HCT 32.5* 31.8* 30.3*  MCV 92.3 91.4 88.3  PLT 230 220 628   Basic Metabolic Panel: Recent Labs  Lab 03/08/22 1227 03/08/22 1239 03/09/22 0405 03/10/22 0410 03/11/22 0300 03/11/22 1528  NA 132*  --  136 132* 129* 125*  K 3.2*  --  3.8 3.3* 3.9 3.9  CL 103  --  113* 107 102 97*  CO2 21*  --  19* 20* 21* 22  GLUCOSE 114*  --  88 99 114* 127*  BUN 19  --  14 10 5* <5*  CREATININE 0.83  --  0.62  0.51 0.56 0.58  CALCIUM 8.8*  --  8.0* 7.9* 8.4* 8.2*  MG  --  1.6* 2.1  --   --   --    Liver Function Tests: Recent Labs  Lab 03/08/22 1227  AST 20  ALT 11  ALKPHOS 64  BILITOT 0.8  PROT 6.4*  ALBUMIN 2.6*   CBG: No results for input(s): "GLUCAP" in the last 168 hours.  Discharge time spent: greater than 30 minutes.  Signed: Loletha Grayer, MD [facility] 03/12/2022

## 2022-03-13 LAB — CULTURE, BLOOD (ROUTINE X 2)
Culture: NO GROWTH
Culture: NO GROWTH

## 2022-03-24 ENCOUNTER — Ambulatory Visit: Payer: Medicare Other | Admitting: Physician Assistant

## 2022-03-24 DIAGNOSIS — R339 Retention of urine, unspecified: Secondary | ICD-10-CM | POA: Diagnosis not present

## 2022-03-24 LAB — BLADDER SCAN AMB NON-IMAGING: Scan Result: 371

## 2022-03-24 NOTE — Progress Notes (Signed)
Catheter Removal  Patient is present today for a catheter removal.  71m of water was drained from the balloon. A 16FR foley cath was removed from the bladder, no complications were noted. Patient tolerated well.  Performed by: JGaspar ColaCma  Follow up/ Additional notes: This afternoon

## 2022-03-24 NOTE — Progress Notes (Signed)
03/24/2022 5:01 PM   Carly Peck 20-Aug-1931 [id number]  CC: Chief Complaint  Patient presents with   Urinary Retention   HPI: Carly Peck is a 89 y.o. female with a recent hospitalization with acute ischemic stroke and herpes zoster who developed urinary retention requiring Foley catheter placement in the OR by Dr. Bernardo Heater who presents today for outpatient voiding trial.  Foley catheter removed in the morning, see separate procedure note for details.  She returned to clinic in the afternoon for PVR.  She is accompanied by her daughter, who contributes to HPI.  She reports that she has urinated 3 times throughout the day without difficulty.  She has had some abdominal discomfort, however they have had difficulty determining if this is nerve pain from her recent herpes zoster.  She denies abdominal distention.  Her bladder was not completely decompressed on review of prior CT scans dated 02/26/2022 and 10/22/2020.  PVR 371 mL.  PMH: Past Medical History:  Diagnosis Date   Anxiety    Arthritis    Breast cancer ([place]) 1997   left breast cancer   Cancer ([place])    Environmental and seasonal allergies    GERD (gastroesophageal reflux disease)    History of left breast cancer    Hyperlipidemia    Hypertension    Left bundle branch block (LBBB)    Neuropathy    Personal history of chemotherapy 1997   Left breast   Personal history of radiation therapy 1997   Left breast    Surgical History: Past Surgical History:  Procedure Laterality Date   BACK SURGERY     BREAST SURGERY     CARDIAC CATHETERIZATION     CYSTOSCOPY N/A 03/08/2022   Procedure: Foley placement;  Surgeon: Abbie Sons, MD;  Location: [facility] ORS;  Service: Urology;  Laterality: N/A;   DILATION AND CURETTAGE OF UTERUS     EYE SURGERY Bilateral    Cataract Extraction with IOL   KNEE ARTHROPLASTY Right 09/14/2015   Procedure: COMPUTER ASSISTED TOTAL KNEE ARTHROPLASTY;  Surgeon: Dereck Leep, MD;  Location:  [facility] ORS;  Service: Orthopedics;  Laterality: Right;   KNEE ARTHROPLASTY Left 05/30/2016   Procedure: COMPUTER ASSISTED TOTAL KNEE ARTHROPLASTY;  Surgeon: Dereck Leep, MD;  Location: [facility] ORS;  Service: Orthopedics;  Laterality: Left;   KYPHOPLASTY  2016   Dr. Rudene Christians, [place] Left 1997   chemo rad mastectomy   TONSILLECTOMY      Home Medications:  Allergies as of 03/24/2022       Reactions   Adhesive [tape] Other (See Comments)   Red mark, Please use "paper" tape        Medication List        Accurate as of March 24, 2022  5:01 PM. If you have any questions, ask your nurse or doctor.          acetaminophen 500 MG tablet Commonly known as: TYLENOL Take 1,000 mg by mouth 2 (two) times daily as needed for mild pain or headache.   aspirin EC 81 MG tablet Take 1 tablet (81 mg total) by mouth daily. Swallow whole.   atorvastatin 40 MG tablet Commonly known as: LIPITOR Take 1 tablet (40 mg total) by mouth every evening.   Azelastine HCl 137 MCG/SPRAY Soln SMARTSIG:1 Spray(s) Both Nares Every 12 Hours PRN   carvedilol 12.5 MG tablet Commonly known as: COREG Take 12.5 mg by mouth 2 (two) times daily with a meal.  cetirizine 10 MG tablet Commonly known as: ZYRTEC Take 10 mg by mouth daily.   clobetasol ointment 0.05 % Commonly known as: TEMOVATE APPLY TO AFFECTED AREA DAILY AS NEEDED for rash on buttocks   clopidogrel 75 MG tablet Commonly known as: PLAVIX Take 1 tablet (75 mg total) by mouth daily.   DULoxetine 30 MG capsule Commonly known as: CYMBALTA Take 30 mg by mouth daily.   fluticasone 50 MCG/ACT nasal spray Commonly known as: FLONASE Place 2 sprays into both nostrils 2 (two) times daily as needed for rhinitis.   gabapentin 100 MG capsule Commonly known as: Neurontin Take 1 capsule (100 mg total) by mouth 3 (three) times daily.   lactulose 10 GM/15ML solution Commonly known as: CHRONULAC Take 30 mLs (20 g total) by mouth 2 (two) times  daily as needed for severe constipation.   pantoprazole 40 MG tablet Commonly known as: PROTONIX Take 1 tablet (40 mg total) by mouth daily.   sennosides-docusate sodium 8.6-50 MG tablet Commonly known as: SENOKOT-S Take 4 tablets by mouth at bedtime.   Vitamin B-12 1000 MCG Subl Place under the tongue.   Vitamin D3 50 MCG (2000 UT) Tabs Take 2,000 Units by mouth daily.        Allergies:  Allergies  Allergen Reactions   Adhesive [Tape] Other (See Comments)    Red mark, Please use "paper" tape    Family History: Family History  Problem Relation Age of Onset   Breast cancer Neg Hx     Social History:   reports that she has never smoked. She has never used smokeless tobacco. She reports current alcohol use of about 1.0 standard drink of alcohol per week. She reports that she does not use drugs.  Physical Exam: There were no vitals taken for this visit.  Constitutional:  Alert and oriented, no acute distress, nontoxic appearing HEENT: [place], AT Cardiovascular: No clubbing, cyanosis, or edema Respiratory: Normal respiratory effort, no increased work of breathing Skin: No rashes, bruises or suspicious lesions Neurologic: Grossly intact, no focal deficits, moving all 4 extremities Psychiatric: Normal mood and affect  Laboratory Data: Results for orders placed or performed in visit on 03/24/22  Bladder Scan (Post Void Residual) in office  Result Value Ref Range   Scan Result 371    Assessment & Plan:   1. Urinary retention Bladder scan is elevated this afternoon, though she has been able to void and denies difficulty doing so.  We discussed that it is possible that she has an element of incomplete bladder emptying at baseline.  I offered them Foley catheter replacement with repeat voiding trial in 7 to 10 days versus trial without catheter overnight with repeat PVR tomorrow morning and they elected for the latter.  We discussed that if she develops abdominal distention,  worsening pain, fevers, the inability to void they should proceed to the ED overnight. - Bladder Scan (Post Void Residual) in office  Return in 1 day (on 03/25/2022) for Repeat PVR.  Debroah Loop, PA-C  [facility] [address], [place], [place] [address] [phone]

## 2022-03-25 ENCOUNTER — Ambulatory Visit: Payer: Medicare Other | Admitting: Physician Assistant

## 2022-03-25 DIAGNOSIS — R310 Gross hematuria: Secondary | ICD-10-CM | POA: Diagnosis not present

## 2022-03-25 DIAGNOSIS — R339 Retention of urine, unspecified: Secondary | ICD-10-CM | POA: Diagnosis not present

## 2022-03-25 LAB — BLADDER SCAN AMB NON-IMAGING: Scan Result: 282

## 2022-03-25 NOTE — Progress Notes (Signed)
03/25/2022 9:12 AM   Carly Peck 1932-03-16 [id number]  CC: Chief Complaint  Patient presents with   Urinary Retention   HPI: Carly Peck is a 89 y.o. female with a recent hospitalization with acute ischemic stroke and herpes zoster who developed urinary retention requiring Foley catheter placement in the OR by Dr. Bernardo Heater who presents today for repeat PVR after clinical voiding trial with me yesterday.  She is accompanied today by her husband and daughter.  She has been able to void several times overnight without difficulty. She did feel once that she may have incompletely emptied but denies pain or dysuria. PVR 278m, 3739myesterday.  Her daughter reports that she had some gross hematuria in her Foley catheter soon after placement, which has since resolved.  They deny any other episodes of gross hematuria.  PMH: Past Medical History:  Diagnosis Date   Anxiety    Arthritis    [place]   [place]   [place]   Environmental and seasonal allergies    GERD (gastroesophageal reflux disease)    History of left breast cancer    Hyperlipidemia    Hypertension    Left bundle branch block (LBBB)    Neuropathy    Personal history of chemotherapy 1997   Left breast   Personal history of radiation therapy 1997   Left breast    Surgical History: Past Surgical History:  Procedure Laterality Date   BACK SURGERY     BREAST SURGERY     CARDIAC CATHETERIZATION     CYSTOSCOPY N/A 03/08/2022   Procedure: Foley placement;  Surgeon: StAbbie SonsMD;  Location: [facility] ORS;  Service: Urology;  Laterality: N/A;   DILATION AND CURETTAGE OF UTERUS     EYE SURGERY Bilateral    Cataract Extraction with IOL   KNEE ARTHROPLASTY Right 09/14/2015   Procedure: COMPUTER ASSISTED TOTAL KNEE ARTHROPLASTY;  Surgeon: JaDereck LeepMD;  Location: [facility] ORS;  Service: Orthopedics;  Laterality: Right;   KNEE ARTHROPLASTY Left 05/30/2016   Procedure: COMPUTER  ASSISTED TOTAL KNEE ARTHROPLASTY;  Surgeon: JaDereck LeepMD;  Location: [facility] ORS;  Service: Orthopedics;  Laterality: Left;   KYPHOPLASTY  2016   Dr. MeRudene ChristiansARTrezevanteft 1997   chemo rad mastectomy   TONSILLECTOMY      Home Medications:  Allergies as of 03/25/2022       Reactions   Adhesive [tape] Other (See Comments)   Red mark, Please use "paper" tape        Medication List        Accurate as of March 25, 2022  9:12 AM. If you have any questions, ask your nurse or doctor.          acetaminophen 500 MG tablet Commonly known as: TYLENOL Take 1,000 mg by mouth 2 (two) times daily as needed for mild pain or headache.   aspirin EC 81 MG tablet Take 1 tablet (81 mg total) by mouth daily. Swallow whole.   atorvastatin 40 MG tablet Commonly known as: LIPITOR Take 1 tablet (40 mg total) by mouth every evening.   Azelastine HCl 137 MCG/SPRAY Soln SMARTSIG:1 Spray(s) Both Nares Every 12 Hours PRN   carvedilol 12.5 MG tablet Commonly known as: COREG Take 12.5 mg by mouth 2 (two) times daily with a meal.   cetirizine 10 MG tablet Commonly known as: ZYRTEC Take 10 mg by mouth daily.   clobetasol ointment 0.05 %  Commonly known as: TEMOVATE APPLY TO AFFECTED AREA DAILY AS NEEDED for rash on buttocks   clopidogrel 75 MG tablet Commonly known as: PLAVIX Take 1 tablet (75 mg total) by mouth daily.   DULoxetine 30 MG capsule Commonly known as: CYMBALTA Take 30 mg by mouth daily.   fluticasone 50 MCG/ACT nasal spray Commonly known as: FLONASE Place 2 sprays into both nostrils 2 (two) times daily as needed for rhinitis.   gabapentin 100 MG capsule Commonly known as: Neurontin Take 1 capsule (100 mg total) by mouth 3 (three) times daily.   lactulose 10 GM/15ML solution Commonly known as: CHRONULAC Take 30 mLs (20 g total) by mouth 2 (two) times daily as needed for severe constipation.   pantoprazole 40 MG tablet Commonly known as: PROTONIX Take 1  tablet (40 mg total) by mouth daily.   sennosides-docusate sodium 8.6-50 MG tablet Commonly known as: SENOKOT-S Take 4 tablets by mouth at bedtime.   Vitamin B-12 1000 MCG Subl Place under the tongue.   Vitamin D3 50 MCG (2000 UT) Tabs Take 2,000 Units by mouth daily.        Allergies:  Allergies  Allergen Reactions   Adhesive [Tape] Other (See Comments)    Red mark, Please use "paper" tape    Family History: Family History  Problem Relation Age of Onset   Breast cancer Neg Hx     Social History:   reports that she has never smoked. She has never used smokeless tobacco. She reports current alcohol use of about 1.0 standard drink of alcohol per week. She reports that she does not use drugs.  Physical Exam: There were no vitals taken for this visit.  Constitutional:  Alert and oriented, no acute distress, nontoxic appearing HEENT: [place], AT Cardiovascular: No clubbing, cyanosis, or edema Respiratory: Normal respiratory effort, no increased work of breathing Skin: No rashes, bruises or suspicious lesions Neurologic: Grossly intact, no focal deficits, moving all 4 extremities Psychiatric: Normal mood and affect  Laboratory Data: Results for orders placed or performed in visit on 03/25/22  Bladder Scan (Post Void Residual) in office  Result Value Ref Range   Scan Result 282 ml    Assessment & Plan:   1. Urinary retention PVR improved over prior and she continues to void spontaneously. We discussed that this further supports the theory that she may have an element of incomplete bladder emptying at baseline. Will see her back next month for symptom recheck and PVR.  Discussed return precautions including dysuria, gross hematuria, difficulty urinating, and abdominal distention. - Bladder Scan (Post Void Residual) in office  2. Hematuria, gross Likely secondary to difficult Foley placement and catheter irritation.  I counseled them to make me aware if her gross hematuria  recurs, at which point we will keep a low threshold to pursue hematuria workup.  They are in agreement with this plan.  Return in about 4 weeks (around 04/22/2022) for Symptom recheck with PVR.  Debroah Loop, PA-C  [facility] [address], [place], [place] [address] [phone]

## 2022-03-29 ENCOUNTER — Encounter: Payer: Self-pay | Admitting: Urology

## 2022-04-26 ENCOUNTER — Encounter: Payer: Self-pay | Admitting: Physician Assistant

## 2022-04-26 ENCOUNTER — Ambulatory Visit: Payer: Medicare Other | Admitting: Physician Assistant

## 2022-04-26 VITALS — BP 109/69 | HR 93 | Ht 65.0 in | Wt 132.0 lb

## 2022-04-26 DIAGNOSIS — R310 Gross hematuria: Secondary | ICD-10-CM | POA: Diagnosis not present

## 2022-04-26 DIAGNOSIS — R339 Retention of urine, unspecified: Secondary | ICD-10-CM

## 2022-04-26 LAB — BLADDER SCAN AMB NON-IMAGING: Scan Result: 0

## 2022-04-26 NOTE — Progress Notes (Signed)
04/26/2022 9:54 AM   Carly Peck 1932/02/04 [id number]  CC: Chief Complaint  Patient presents with   Urinary Retention   HPI: Carly Peck is a 89 y.o. female with PMH urinary retention during hospitalization with acute ischemic stroke and herpes zoster requiring Foley catheter placement in the OR who presents today for repeat PVR after passing an outpatient voiding trial with me last month.  She is accompanied today by her daughter.   Today she reports she continues to void without difficulty.  She denies any further episodes of gross hematuria.  She denies dysuria, urgency, urinary incontinence, or frequency.  She reports nocturia x 0-1.  PVR 0 mL.  PMH: Past Medical History:  Diagnosis Date   Anxiety    Arthritis    Breast cancer ([place]) 1997   left breast cancer   Cancer ([place])    Environmental and seasonal allergies    GERD (gastroesophageal reflux disease)    History of left breast cancer    Hyperlipidemia    Hypertension    Left bundle branch block (LBBB)    Neuropathy    Personal history of chemotherapy 1997   Left breast   Personal history of radiation therapy 1997   Left breast    Surgical History: Past Surgical History:  Procedure Laterality Date   BACK SURGERY     BREAST SURGERY     CARDIAC CATHETERIZATION     CYSTOSCOPY N/A 03/08/2022   Procedure: Foley placement;  Surgeon: Abbie Sons, MD;  Location: [facility] ORS;  Service: Urology;  Laterality: N/A;   DILATION AND CURETTAGE OF UTERUS     EYE SURGERY Bilateral    Cataract Extraction with IOL   KNEE ARTHROPLASTY Right 09/14/2015   Procedure: COMPUTER ASSISTED TOTAL KNEE ARTHROPLASTY;  Surgeon: Dereck Leep, MD;  Location: [facility] ORS;  Service: Orthopedics;  Laterality: Right;   KNEE ARTHROPLASTY Left 05/30/2016   Procedure: COMPUTER ASSISTED TOTAL KNEE ARTHROPLASTY;  Surgeon: Dereck Leep, MD;  Location: [facility] ORS;  Service: Orthopedics;  Laterality: Left;   KYPHOPLASTY  2016   Dr. Rudene Christians, [place] Left 1997   chemo rad mastectomy   TONSILLECTOMY      Home Medications:  Allergies as of 04/26/2022       Reactions   Adhesive [tape] Other (See Comments)   Red mark, Please use "paper" tape        Medication List        Accurate as of April 26, 2022  9:54 AM. If you have any questions, ask your nurse or doctor.          acetaminophen 500 MG tablet Commonly known as: TYLENOL Take 1,000 mg by mouth 2 (two) times daily as needed for mild pain or headache.   aspirin EC 81 MG tablet Take 1 tablet (81 mg total) by mouth daily. Swallow whole.   atorvastatin 40 MG tablet Commonly known as: LIPITOR Take 1 tablet (40 mg total) by mouth every evening.   Azelastine HCl 137 MCG/SPRAY Soln SMARTSIG:1 Spray(s) Both Nares Every 12 Hours PRN   carvedilol 12.5 MG tablet Commonly known as: COREG Take 12.5 mg by mouth 2 (two) times daily with a meal.   cetirizine 10 MG tablet Commonly known as: ZYRTEC Take 10 mg by mouth daily.   clobetasol ointment 0.05 % Commonly known as: TEMOVATE APPLY TO AFFECTED AREA DAILY AS NEEDED for rash on buttocks   clopidogrel 75 MG tablet Commonly known as: PLAVIX Take  1 tablet (75 mg total) by mouth daily.   DULoxetine 30 MG capsule Commonly known as: CYMBALTA Take 30 mg by mouth daily.   fluticasone 50 MCG/ACT nasal spray Commonly known as: FLONASE Place 2 sprays into both nostrils 2 (two) times daily as needed for rhinitis.   gabapentin 100 MG capsule Commonly known as: Neurontin Take 1 capsule (100 mg total) by mouth 3 (three) times daily.   lactulose 10 GM/15ML solution Commonly known as: CHRONULAC Take 30 mLs (20 g total) by mouth 2 (two) times daily as needed for severe constipation.   pantoprazole 40 MG tablet Commonly known as: PROTONIX Take 1 tablet (40 mg total) by mouth daily.   sennosides-docusate sodium 8.6-50 MG tablet Commonly known as: SENOKOT-S Take 4 tablets by mouth at bedtime.   Vitamin B-12  1000 MCG Subl Place under the tongue.   Vitamin D3 50 MCG (2000 UT) Tabs Take 2,000 Units by mouth daily.        Allergies:  Allergies  Allergen Reactions   Adhesive [Tape] Other (See Comments)    Red mark, Please use "paper" tape    Family History: Family History  Problem Relation Age of Onset   Breast cancer Neg Hx     Social History:   reports that she has never smoked. She has never used smokeless tobacco. She reports current alcohol use of about 1.0 standard drink of alcohol per week. She reports that she does not use drugs.  Physical Exam: BP 109/69   Pulse 93   Ht '5\' 5"'$  (1.651 m)   Wt 132 lb (59.9 kg)   BMI 21.97 kg/m   Constitutional:  Alert and oriented, no acute distress, nontoxic appearing HEENT: [place], AT Cardiovascular: No clubbing, cyanosis, or edema Respiratory: Normal respiratory effort, no increased work of breathing Skin: No rashes, bruises or suspicious lesions Neurologic: Grossly intact, no focal deficits, moving all 4 extremities Psychiatric: Normal mood and affect  Laboratory Data: Results for orders placed or performed in visit on 04/26/22  Bladder Scan (Post Void Residual) in office  Result Value Ref Range   Scan Result 0    Assessment & Plan:   1. Urinary retention Resolved, emptying appropriately.  She denies any bothersome urinary symptoms now that she has returned to her baseline.  Okay to follow-up as needed. - Bladder Scan (Post Void Residual) in office  2. Hematuria, gross As noted previously, likely secondary to difficult Foley placement and catheter irritation.  She has had no further episodes of gross hematuria since Foley catheter removal.  We discussed return precautions including recurrent gross hematuria, at which point we will pursue a hematuria workup.  She expressed understanding.  Return if symptoms worsen or fail to improve.  Debroah Loop, PA-C  [facility] [address], [place], [place] [address] [phone]

## 2022-05-09 ENCOUNTER — Inpatient Hospital Stay: Payer: Medicare Other | Attending: Internal Medicine

## 2022-05-09 DIAGNOSIS — Z8673 Personal history of transient ischemic attack (TIA), and cerebral infarction without residual deficits: Secondary | ICD-10-CM | POA: Insufficient documentation

## 2022-05-09 DIAGNOSIS — Z9221 Personal history of antineoplastic chemotherapy: Secondary | ICD-10-CM | POA: Diagnosis not present

## 2022-05-09 DIAGNOSIS — Z79899 Other long term (current) drug therapy: Secondary | ICD-10-CM | POA: Insufficient documentation

## 2022-05-09 DIAGNOSIS — Z923 Personal history of irradiation: Secondary | ICD-10-CM | POA: Insufficient documentation

## 2022-05-09 DIAGNOSIS — Z7982 Long term (current) use of aspirin: Secondary | ICD-10-CM | POA: Insufficient documentation

## 2022-05-09 DIAGNOSIS — D472 Monoclonal gammopathy: Secondary | ICD-10-CM | POA: Insufficient documentation

## 2022-05-09 DIAGNOSIS — D649 Anemia, unspecified: Secondary | ICD-10-CM | POA: Insufficient documentation

## 2022-05-09 DIAGNOSIS — Z853 Personal history of malignant neoplasm of breast: Secondary | ICD-10-CM | POA: Insufficient documentation

## 2022-05-09 DIAGNOSIS — R700 Elevated erythrocyte sedimentation rate: Secondary | ICD-10-CM | POA: Diagnosis not present

## 2022-05-09 DIAGNOSIS — Z7902 Long term (current) use of antithrombotics/antiplatelets: Secondary | ICD-10-CM | POA: Diagnosis not present

## 2022-05-09 DIAGNOSIS — I10 Essential (primary) hypertension: Secondary | ICD-10-CM | POA: Diagnosis not present

## 2022-05-09 DIAGNOSIS — C880 Waldenstrom macroglobulinemia: Secondary | ICD-10-CM | POA: Diagnosis present

## 2022-05-09 LAB — CBC WITH DIFFERENTIAL/PLATELET
Abs Immature Granulocytes: 0.01 10*3/uL (ref 0.00–0.07)
Basophils Absolute: 0.1 10*3/uL (ref 0.0–0.1)
Basophils Relative: 1 %
Eosinophils Absolute: 0.4 10*3/uL (ref 0.0–0.5)
Eosinophils Relative: 6 %
HCT: 32.6 % — ABNORMAL LOW (ref 36.0–46.0)
Hemoglobin: 10.6 g/dL — ABNORMAL LOW (ref 12.0–15.0)
Immature Granulocytes: 0 %
Lymphocytes Relative: 22 %
Lymphs Abs: 1.4 10*3/uL (ref 0.7–4.0)
MCH: 30.2 pg (ref 26.0–34.0)
MCHC: 32.5 g/dL (ref 30.0–36.0)
MCV: 92.9 fL (ref 80.0–100.0)
Monocytes Absolute: 0.8 10*3/uL (ref 0.1–1.0)
Monocytes Relative: 13 %
Neutro Abs: 3.5 10*3/uL (ref 1.7–7.7)
Neutrophils Relative %: 58 %
Platelets: 298 10*3/uL (ref 150–400)
RBC: 3.51 MIL/uL — ABNORMAL LOW (ref 3.87–5.11)
RDW: 13.5 % (ref 11.5–15.5)
WBC: 6.1 10*3/uL (ref 4.0–10.5)
nRBC: 0 % (ref 0.0–0.2)

## 2022-05-09 LAB — COMPREHENSIVE METABOLIC PANEL
ALT: 13 U/L (ref 0–44)
AST: 17 U/L (ref 15–41)
Albumin: 3.3 g/dL — ABNORMAL LOW (ref 3.5–5.0)
Alkaline Phosphatase: 85 U/L (ref 38–126)
Anion gap: 8 (ref 5–15)
BUN: 13 mg/dL (ref 8–23)
CO2: 27 mmol/L (ref 22–32)
Calcium: 9.6 mg/dL (ref 8.9–10.3)
Chloride: 96 mmol/L — ABNORMAL LOW (ref 98–111)
Creatinine, Ser: 0.66 mg/dL (ref 0.44–1.00)
GFR, Estimated: >60 mL/min (ref >60)
Glucose, Bld: 104 mg/dL — ABNORMAL HIGH (ref 70–99)
Potassium: 3.8 mmol/L (ref 3.5–5.1)
Sodium: 131 mmol/L — ABNORMAL LOW (ref 135–145)
Total Bilirubin: 0.6 mg/dL (ref 0.3–1.2)
Total Protein: 7.6 g/dL (ref 6.5–8.1)

## 2022-05-09 LAB — SEDIMENTATION RATE: Sed Rate: 111 mm/hr — ABNORMAL HIGH (ref 0–30)

## 2022-05-09 LAB — LACTATE DEHYDROGENASE: LDH: 89 U/L — ABNORMAL LOW (ref 98–192)

## 2022-05-10 LAB — KAPPA/LAMBDA LIGHT CHAINS
Kappa free light chain: 10.1 mg/L (ref 3.3–19.4)
Kappa, lambda light chain ratio: 0.27 (ref 0.26–1.65)
Lambda free light chains: 37 mg/L — ABNORMAL HIGH (ref 5.7–26.3)

## 2022-05-17 LAB — MULTIPLE MYELOMA PANEL, SERUM
Albumin SerPl Elph-Mcnc: 3.2 g/dL (ref 2.9–4.4)
Albumin/Glob SerPl: 0.9 (ref 0.7–1.7)
Alpha 1: 0.4 g/dL (ref 0.0–0.4)
Alpha2 Glob SerPl Elph-Mcnc: 0.9 g/dL (ref 0.4–1.0)
B-Globulin SerPl Elph-Mcnc: 0.9 g/dL (ref 0.7–1.3)
Gamma Glob SerPl Elph-Mcnc: 1.6 g/dL (ref 0.4–1.8)
Globulin, Total: 3.8 g/dL (ref 2.2–3.9)
IgA: 34 mg/dL — ABNORMAL LOW (ref 64–422)
IgG (Immunoglobin G), Serum: 409 mg/dL — ABNORMAL LOW (ref 586–1602)
IgM (Immunoglobulin M), Srm: 2748 mg/dL — ABNORMAL HIGH (ref 26–217)
M Protein SerPl Elph-Mcnc: 0.9 g/dL — ABNORMAL HIGH
Total Protein ELP: 7 g/dL (ref 6.0–8.5)

## 2022-05-19 ENCOUNTER — Inpatient Hospital Stay: Payer: Medicare Other | Admitting: Internal Medicine

## 2022-05-19 VITALS — BP 118/61 | HR 71 | Temp 100.0°F | Resp 18 | Wt 139.2 lb

## 2022-05-19 DIAGNOSIS — D472 Monoclonal gammopathy: Secondary | ICD-10-CM

## 2022-05-19 DIAGNOSIS — C880 Waldenstrom macroglobulinemia: Secondary | ICD-10-CM | POA: Diagnosis not present

## 2022-05-19 NOTE — Assessment & Plan Note (Addendum)
#   MGUS-IgM lambda- M protein- OCTOBER  2023-M protein- 1.5 gm/dl; kappa lambda light chain ratio- wnl.    # However repeat [id number]- M protein-0.9 g/dL; kappa lambda light chain ratio normal-mild anemia hemoglobin 10.5 overall stable; no hypercalcemia.  ESR elevated likely secondary to MGUS.  Recommend monitoring for now.  Hold off any bone marrow biopsy/PET scan at this time.  # Mild intermittent anemia- 10-11 ? etiology STABLE;[colo-many years ago]; AUG 2022 CT AP [chronic left quad pain]- NO lymphadenopathy.   #Chronic neuropathy-nortryptline/ gabapentin-stable [Dr.Potter];STABLE.   #Chronically elevated ESR 80s to 90s; CRP normal. ? Related MGUS-STABLE.   # History of breast cancer- ER/PR positive high risk/stage III. Clinically no evidence of recurrence.  APRIL 2023- diagnostic mammogram normal- STABLE.   # DISPOSITION:  # follow up in 6 months- MD; 10 days prior- labs- cbc/cmp; LDH; ESR; MM panel; K/L light chain ratio- Dr.B  Cc; Dr.Anderson.

## 2022-05-19 NOTE — Progress Notes (Signed)
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 degen changes; SEP 2018-M spike- 1.3 gm/dl  # 2016-Temporal A Bx-neg [headaches/diziness].   # Bil LE PN [Dr.Potter- on Neurontin]  # 1997-LEFT BREAST CA Stage III  ER-POS;s/p MASTEC;s/p chemo; s/p RT NSABP- 33; s/p Aromasin.   # ESR- Elevated ? From Monoclonal gammopathy ----------------------------------------------------   DIAGNOSIS: IgM- MGUS   CURRENT/MOST RECENT THERAPY: surveillaince    Macroglobulinemia of [place]denstrom ([place])   INTERVAL HISTORY: Patient ambulating with assistance.  Accompanied by daughter  89 year old female patient with above history of IgM-MGUS is here for follow-up.  In the interim patient was diagnosed with shingles.  Status posttreatment.   Pt had a light stroke in December. Had initial left side weakness which has dissipated. Affected her memory. Appetite is good. Energy is fair. Stamina is improved. Ambulates with a walker.  Patient denies any worsening shortness of breath or cough.  However she does short of breath on exertion.  Positive for fatigue.  No fevers or chills.  No new back pain.  Chronic tingling numbness in extremities.  Review of Systems  Constitutional:  Positive for malaise/fatigue. Negative for chills, diaphoresis, fever and weight loss.  HENT:  Negative for nosebleeds and sore throat.   Eyes:  Negative for double vision.  Respiratory:  Positive for shortness of breath. Negative for cough, hemoptysis, sputum production and  wheezing.   Cardiovascular:  Negative for palpitations, orthopnea and leg swelling.  Gastrointestinal:  Negative for abdominal pain, blood in stool, constipation, diarrhea, heartburn, melena, nausea and vomiting.  Genitourinary:  Negative for dysuria, frequency and urgency.  Musculoskeletal:  Positive for back pain and joint pain.  Skin: Negative.  Negative for itching and rash.  Neurological:  Positive for tingling. Negative for dizziness, focal weakness, weakness and headaches.  Endo/Heme/Allergies:  Does not bruise/bleed easily.  Psychiatric/Behavioral:  Negative for depression. The patient is not nervous/anxious and does not have insomnia.    PAST MEDICAL HISTORY :  Past Medical History:  Diagnosis Date   Anxiety    Arthritis    Breast cancer ([place]) 1997   left breast cancer   Cancer ([place])    Environmental and seasonal allergies    GERD (gastroesophageal reflux disease)    History of left breast cancer    Hyperlipidemia    Hypertension    Left bundle branch block (LBBB)    Neuropathy    Personal history of chemotherapy 1997   Left breast   Personal history of radiation therapy 1997   Left breast    PAST SURGICAL HISTORY :   Past Surgical History:  Procedure Laterality Date   BACK SURGERY     BREAST SURGERY     CARDIAC CATHETERIZATION     CYSTOSCOPY N/A 03/08/2022   Procedure: Foley placement;  Surgeon: Abbie Sons, MD;  Location: [facility] ORS;  Service: Urology;  Laterality: N/A;   DILATION AND CURETTAGE OF UTERUS     EYE SURGERY Bilateral    Cataract Extraction with IOL   KNEE ARTHROPLASTY Right 09/14/2015   Procedure: COMPUTER ASSISTED TOTAL KNEE ARTHROPLASTY;  Surgeon: Dereck Leep, MD;  Location: [facility] OR[facility];  Service: Orthopedics;  Laterality: Right;   KNEE ARTHROPLASTY Left 05/30/2016   Procedure: COMPUTER ASSISTED TOTAL KNEE ARTHROPLASTY;  Surgeon: Dereck Leep, MD;  Location: [facility] OR[facility];  Service: Orthopedics;  Laterality: Left;   KYPHOPLASTY  2016   Dr. Rudene Christians,  [place] Left 1997   chemo rad mastectomy   TONSILLECTOMY      FAMILY HISTORY :   Family History  Problem Relation Age of Onset   Breast cancer Neg Hx     SOCIAL HISTORY:   Social History   Tobacco Use   Smoking status: Never   Smokeless tobacco: Never  Substance Use Topics   Alcohol use: Yes    Alcohol/week: 1.0 standard drink of alcohol    Types: 1 Glasses of wine per week    Comment: occ.   Drug use: No    ALLERGIES:  is allergic to adhesive [tape].  MEDICATIONS:  Current Outpatient Medications  Medication Sig Dispense Refill   acetaminophen (TYLENOL) 500 MG tablet Take 1,000 mg by mouth 2 (two) times daily as needed for mild pain or headache.      Apoaequorin (PREVAGEN) 10 MG CAPS Take 1 tablet by mouth daily after breakfast.     aspirin EC 81 MG tablet Take 1 tablet (81 mg total) by mouth daily. Swallow whole. 30 tablet 0   atorvastatin (LIPITOR) 40 MG tablet Take 1 tablet (40 mg total) by mouth every evening. 30 tablet 0   Azelastine HCl 137 MCG/SPRAY SOLN SMARTSIG:1 Spray(s) Both Nares Every 12 Hours PRN     carvedilol (COREG) 12.5 MG tablet Take 12.5 mg by mouth 2 (two) times daily with a meal.     cetirizine (ZYRTEC) 10 MG tablet Take 10 mg by mouth daily.     Cholecalciferol (VITAMIN D3) 2000 units TABS Take 2,000 Units by mouth daily.     clobetasol ointment (TEMOVATE) 0.05 % APPLY TO AFFECTED AREA DAILY AS NEEDED for rash on buttocks  3   clopidogrel (PLAVIX) 75 MG tablet Take 1 tablet (75 mg total) by mouth daily. 19 tablet 0   Cyanocobalamin (VITAMIN B-12) 1000 MCG SUBL Place under the tongue.     DULoxetine (CYMBALTA) 30 MG capsule Take 30 mg by mouth daily.     fluticasone (FLONASE) 50 MCG/ACT nasal spray Place 2 sprays into both nostrils 2 (two) times daily as needed for rhinitis.     gabapentin (NEURONTIN) 100 MG capsule Take 1 capsule (100 mg total) by mouth 3 (three) times daily. 90 capsule 0   lactulose (CHRONULAC) 10 GM/15ML solution Take 30  mLs (20 g total) by mouth 2 (two) times daily as needed for severe constipation. 946 mL 0   pantoprazole (PROTONIX) 40 MG tablet Take 1 tablet (40 mg total) by mouth daily. 30 tablet 0   potassium chloride (KLOR-CON) 10 MEQ tablet Take 1 tablet by mouth daily.     sennosides-docusate sodium (SENOKOT-S) 8.6-50 MG tablet Take 4 tablets by mouth at bedtime.      No current facility-administered medications for this visit.    PHYSICAL EXAMINATION:  BP 118/61 (BP Location: Left Arm, Patient Position: Sitting)   Pulse 71   Temp 100 F (37.8 C) (Tympanic)   Resp 18   Wt 139 lb 3.2 oz (63.1 kg)   SpO2 100%   BMI 23.16 kg/m   Filed Weights   05/19/22 1053  Weight: 139 lb 3.2 oz (63.1 kg)  Physical Exam HENT:     Head: Normocephalic and atraumatic.     Mouth/Throat:     Pharynx: No oropharyngeal exudate.  Eyes:     Pupils: Pupils are equal, round, and reactive to light.  Cardiovascular:     Rate and Rhythm: Normal rate and regular rhythm.  Pulmonary:     Effort: No respiratory distress.     Breath sounds: No wheezing.  Abdominal:     General: Bowel sounds are normal. There is no distension.     Palpations: Abdomen is soft. There is no mass.     Tenderness: There is no abdominal tenderness. There is no guarding or rebound.  Musculoskeletal:        General: No tenderness. Normal range of motion.     Cervical back: Normal range of motion and neck supple.  Skin:    General: Skin is warm.  Neurological:     Mental Status: She is alert and oriented to person, place, and time.  Psychiatric:        Mood and Affect: Affect normal.     LABORATORY DATA:  I have reviewed the data as listed    Component Value Date/Time   NA 131 (L) 05/09/2022 0837   NA 132 (L) 02/02/2014 1947   K 3.8 05/09/2022 0837   K 4.3 02/02/2014 1947   CL 96 (L) 05/09/2022 0837   CL 100 02/02/2014 1947   CO2 27 05/09/2022 0837   CO2 21 02/02/2014 1947   GLUCOSE 104 (H) 05/09/2022 0837   GLUCOSE  107 (H) 02/02/2014 1947   BUN 13 05/09/2022 0837   BUN 22 (H) 02/02/2014 1947   CREATININE 0.66 05/09/2022 0837   CREATININE 0.94 02/02/2014 1947   CALCIUM 9.6 05/09/2022 0837   CALCIUM 10.2 12/08/2014 1540   PROT 7.6 05/09/2022 0837   PROT 7.7 10/21/2013 1106   ALBUMIN 3.3 (L) 05/09/2022 0837   ALBUMIN 3.3 (L) 10/21/2013 1106   AST 17 05/09/2022 0837   AST 16 10/21/2013 1106   ALT 13 05/09/2022 0837   ALT 25 10/21/2013 1106   ALKPHOS 85 05/09/2022 0837   ALKPHOS 83 10/21/2013 1106   BILITOT 0.6 05/09/2022 0837   BILITOT 0.5 10/21/2013 1106   GFRNONAA >60 05/09/2022 0837   GFRNONAA >60 02/02/2014 1947   GFRNONAA 57 (L) 10/21/2013 1106   GFRAA >60 09/25/2019 1041   GFRAA >60 02/02/2014 1947   GFRAA >60 10/21/2013 1106    No results found for: "SPEP", "UPEP"  Lab Results  Component Value Date   WBC 6.1 05/09/2022   NEUTROABS 3.5 05/09/2022   HGB 10.6 (L) 05/09/2022   HCT 32.6 (L) 05/09/2022   MCV 92.9 05/09/2022   PLT 298 05/09/2022      Chemistry      Component Value Date/Time   NA 131 (L) 05/09/2022 0837   NA 132 (L) 02/02/2014 1947   K 3.8 05/09/2022 0837   K 4.3 02/02/2014 1947   CL 96 (L) 05/09/2022 0837   CL 100 02/02/2014 1947   CO2 27 05/09/2022 0837   CO2 21 02/02/2014 1947   BUN 13 05/09/2022 0837   BUN 22 (H) 02/02/2014 1947   CREATININE 0.66 05/09/2022 0837   CREATININE 0.94 02/02/2014 1947      Component Value Date/Time   CALCIUM 9.6 05/09/2022 0837   CALCIUM 10.2 12/08/2014 1540   ALKPHOS 85 05/09/2022 0837   ALKPHOS 83 10/21/2013 1106   AST 17 05/09/2022 0837   AST 16 10/21/2013 1106  ALT 13 05/09/2022 0837   ALT 25 10/21/2013 1106   BILITOT 0.6 05/09/2022 0837   BILITOT 0.5 10/21/2013 1106       RADIOGRAPHIC STUDIES: I have personally reviewed the radiological images as listed and agreed with the findings in the report. No results found.   ASSESSMENT & PLAN:  MGUS (monoclonal gammopathy of unknown significance) # MGUS-IgM  lambda- M protein- OCTOBER  2023-M protein- 1.5 gm/dl; kappa lambda light chain ratio- wnl.    # However repeat [id number]- M protein-0.9 g/dL; kappa lambda light chain ratio normal-mild anemia hemoglobin 10.5 overall stable; no hypercalcemia.  ESR elevated likely secondary to MGUS.  Recommend monitoring for now.  Hold off any bone marrow biopsy/PET scan at this time.  # Mild intermittent anemia- 10-11 ? etiology STABLE;[colo-many years ago]; AUG 2022 CT AP [chronic left quad pain]- NO lymphadenopathy.   #Chronic neuropathy-nortryptline/ gabapentin-stable [Dr.Potter];STABLE.   #Chronically elevated ESR 80s to 90s; CRP normal. ? Related MGUS-STABLE.   # History of breast cancer- ER/PR positive high risk/stage III. Clinically no evidence of recurrence.  APRIL 2023- diagnostic mammogram normal- STABLE.   # DISPOSITION:  # follow up in 6 months- MD; 10 days prior- labs- cbc/cmp; LDH; ESR; MM panel; K/L light chain ratio- Dr.B  Cc; Dr.Anderson.      Cammie Sickle, MD 05/19/2022 11:55 AM

## 2022-05-19 NOTE — Progress Notes (Signed)
Pt had a light stroke in December. Had initial left side weakness which has dissipated. Affected her memory. Appetite is good. Energy is fair. Stamina is improved. Ambulates with a walker.

## 2022-06-20 ENCOUNTER — Other Ambulatory Visit: Payer: Self-pay | Admitting: Internal Medicine

## 2022-06-20 DIAGNOSIS — Z1231 Encounter for screening mammogram for malignant neoplasm of breast: Secondary | ICD-10-CM

## 2022-07-13 ENCOUNTER — Ambulatory Visit
Admission: RE | Admit: 2022-07-13 | Discharge: 2022-07-13 | Disposition: A | Discharge status: Home / Self Care | Payer: Medicare Other | Source: Ambulatory Visit | Attending: Internal Medicine | Admitting: Internal Medicine

## 2022-07-13 ENCOUNTER — Other Ambulatory Visit: Payer: Self-pay | Admitting: Internal Medicine

## 2022-07-13 DIAGNOSIS — Z1231 Encounter for screening mammogram for malignant neoplasm of breast: Secondary | ICD-10-CM | POA: Insufficient documentation

## 2022-11-17 ENCOUNTER — Inpatient Hospital Stay: Payer: Medicare Other

## 2022-11-18 ENCOUNTER — Inpatient Hospital Stay: Payer: Medicare Other | Attending: Internal Medicine

## 2022-11-18 DIAGNOSIS — D472 Monoclonal gammopathy: Secondary | ICD-10-CM | POA: Insufficient documentation

## 2022-11-18 DIAGNOSIS — D649 Anemia, unspecified: Secondary | ICD-10-CM | POA: Diagnosis not present

## 2022-11-18 DIAGNOSIS — Z853 Personal history of malignant neoplasm of breast: Secondary | ICD-10-CM | POA: Diagnosis not present

## 2022-11-18 LAB — CBC WITH DIFFERENTIAL (CANCER CENTER ONLY)
Abs Immature Granulocytes: 0.01 10*3/uL (ref 0.00–0.07)
Basophils Absolute: 0.1 10*3/uL (ref 0.0–0.1)
Basophils Relative: 1 %
Eosinophils Absolute: 0.2 10*3/uL (ref 0.0–0.5)
Eosinophils Relative: 4 %
HCT: 31.1 % — ABNORMAL LOW (ref 36.0–46.0)
Hemoglobin: 9.9 g/dL — ABNORMAL LOW (ref 12.0–15.0)
Immature Granulocytes: 0 %
Lymphocytes Relative: 24 %
Lymphs Abs: 1.4 10*3/uL (ref 0.7–4.0)
MCH: 27.3 pg (ref 26.0–34.0)
MCHC: 31.8 g/dL (ref 30.0–36.0)
MCV: 85.9 fL (ref 80.0–100.0)
Monocytes Absolute: 0.9 10*3/uL (ref 0.1–1.0)
Monocytes Relative: 16 %
Neutro Abs: 3.3 10*3/uL (ref 1.7–7.7)
Neutrophils Relative %: 55 %
Platelet Count: 248 10*3/uL (ref 150–400)
RBC: 3.62 MIL/uL — ABNORMAL LOW (ref 3.87–5.11)
RDW: 14.3 % (ref 11.5–15.5)
WBC Count: 6 10*3/uL (ref 4.0–10.5)
nRBC: 0 % (ref 0.0–0.2)

## 2022-11-18 LAB — CMP (CANCER CENTER ONLY)
ALT: 13 U/L (ref 0–44)
AST: 17 U/L (ref 15–41)
Albumin: 3.3 g/dL — ABNORMAL LOW (ref 3.5–5.0)
Alkaline Phosphatase: 75 U/L (ref 38–126)
Anion gap: 6 (ref 5–15)
BUN: 18 mg/dL (ref 8–23)
CO2: 26 mmol/L (ref 22–32)
Calcium: 9.4 mg/dL (ref 8.9–10.3)
Chloride: 96 mmol/L — ABNORMAL LOW (ref 98–111)
Creatinine: 0.77 mg/dL (ref 0.44–1.00)
GFR, Estimated: >60 mL/min (ref >60)
Glucose, Bld: 107 mg/dL — ABNORMAL HIGH (ref 70–99)
Potassium: 4.3 mmol/L (ref 3.5–5.1)
Sodium: 128 mmol/L — ABNORMAL LOW (ref 135–145)
Total Bilirubin: 0.5 mg/dL (ref 0.3–1.2)
Total Protein: 7.3 g/dL (ref 6.5–8.1)

## 2022-11-18 LAB — LACTATE DEHYDROGENASE: LDH: 85 U/L — ABNORMAL LOW (ref 98–192)

## 2022-11-18 LAB — SEDIMENTATION RATE: Sed Rate: 92 mm/hr — ABNORMAL HIGH (ref 0–30)

## 2022-11-22 LAB — MULTIPLE MYELOMA PANEL, SERUM
Albumin SerPl Elph-Mcnc: 3.1 g/dL (ref 2.9–4.4)
Albumin/Glob SerPl: 0.8 (ref 0.7–1.7)
Alpha 1: 0.4 g/dL (ref 0.0–0.4)
Alpha2 Glob SerPl Elph-Mcnc: 0.9 g/dL (ref 0.4–1.0)
B-Globulin SerPl Elph-Mcnc: 2 g/dL — ABNORMAL HIGH (ref 0.7–1.3)
Gamma Glob SerPl Elph-Mcnc: 0.6 g/dL (ref 0.4–1.8)
Globulin, Total: 3.9 g/dL (ref 2.2–3.9)
IgA: 29 mg/dL — ABNORMAL LOW (ref 64–422)
IgG (Immunoglobin G), Serum: 333 mg/dL — ABNORMAL LOW (ref 586–1602)
IgM (Immunoglobulin M), Srm: 2546 mg/dL — ABNORMAL HIGH (ref 26–217)
M Protein SerPl Elph-Mcnc: 1.1 g/dL — ABNORMAL HIGH
Total Protein ELP: 7 g/dL (ref 6.0–8.5)

## 2022-11-22 LAB — KAPPA/LAMBDA LIGHT CHAINS
Kappa free light chain: 7.3 mg/L (ref 3.3–19.4)
Kappa, lambda light chain ratio: 0.23 — ABNORMAL LOW (ref 0.26–1.65)
Lambda free light chains: 31.8 mg/L — ABNORMAL HIGH (ref 5.7–26.3)

## 2022-11-29 ENCOUNTER — Encounter: Payer: Self-pay | Admitting: Internal Medicine

## 2022-11-29 ENCOUNTER — Inpatient Hospital Stay: Payer: Medicare Other | Attending: Internal Medicine | Admitting: Internal Medicine

## 2022-11-29 VITALS — BP 135/59 | HR 65 | Temp 97.7°F | Ht 65.0 in | Wt 151.2 lb

## 2022-11-29 DIAGNOSIS — Z853 Personal history of malignant neoplasm of breast: Secondary | ICD-10-CM | POA: Insufficient documentation

## 2022-11-29 DIAGNOSIS — Z79899 Other long term (current) drug therapy: Secondary | ICD-10-CM | POA: Diagnosis not present

## 2022-11-29 DIAGNOSIS — Z9221 Personal history of antineoplastic chemotherapy: Secondary | ICD-10-CM | POA: Insufficient documentation

## 2022-11-29 DIAGNOSIS — F0390 Unspecified dementia without behavioral disturbance: Secondary | ICD-10-CM | POA: Insufficient documentation

## 2022-11-29 DIAGNOSIS — G629 Polyneuropathy, unspecified: Secondary | ICD-10-CM | POA: Insufficient documentation

## 2022-11-29 DIAGNOSIS — D472 Monoclonal gammopathy: Secondary | ICD-10-CM | POA: Diagnosis present

## 2022-11-29 DIAGNOSIS — Z9012 Acquired absence of left breast and nipple: Secondary | ICD-10-CM | POA: Diagnosis not present

## 2022-11-29 DIAGNOSIS — D649 Anemia, unspecified: Secondary | ICD-10-CM | POA: Insufficient documentation

## 2022-11-29 NOTE — Assessment & Plan Note (Signed)
#   MGUS-IgM lambda- M protein- OCTOBER  2023-M protein- 1.5 gm/dl; kappa lambda light chain ratio- wnl.    # However repeat SEP 2024-  M protein-1.1; K= 31- stable.mild anemia hemoglobin 9.9- [see below] overall stable; no hypercalcemia.  ESR elevated likely secondary to MGUS.  Recommend monitoring for now.  Hold off any bone marrow biopsy/PET scan at this time.  # Mild intermittent anemia- mild anemia hemoglobin 9.9 ? Slightly worse over time- E;[colo-many years ago]; AUG 2022 CT AP [chronic left quad pain]- NO lymphadenopathy.  #Recommend gentle iron [iron biglycinate; 28 mg ] 1 pill a day.  This pill is unlikely to cause stomach upset or cause constipation.    #Chronic neuropathy/mild dementia- nortryptline/ gabapentin-stable [Dr.Potter]- aricept-  stable.   #Chronically elevated ESR 80s to 90s; CRP normal. ? Related MGUS- stable.  # History of breast cancer- ER/PR positive high risk/stage III. Clinically no evidence of recurrence.  APRIL 2023- diagnostic mammogram normal- stable.   # DISPOSITION:  # follow up in 6 months- MD; 2 weeks prior- labs- cbc/cmp; LDH; ESR; MM panel; K/L light chain ratio; iron studies. Ferritin; b12; folic acid- - Dr.B  Cc; Dr.Anderson.

## 2022-11-29 NOTE — Progress Notes (Signed)
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 degen changes; SEP 2018-M spike- 1.3 gm/dl  # [id number] A Bx-neg [headaches/diziness].   # Bil LE PN [Dr.Potter- on Neurontin]  # 1997-LEFT BREAST CA Stage III  ER-POS;s/p MASTEC;s/p chemo; s/p RT NSABP- 33; s/p Aromasin.   # ESR- Elevated ? From Monoclonal gammopathy ----------------------------------------------------   DIAGNOSIS: IgM- MGUS   CURRENT/MOST RECENT THERAPY: surveillaince    Macroglobulinemia of [place]den[place] (HCC)   INTERVAL HISTORY: Patient ambulating with assistance.  Accompanied by daughter  89 year old female patient with above history of IgM-MGUS is here for follow-up.  Living at [facility] assisted living now.    She does have some trouble falling asleep, no sleep aid other than tylenol. No worsening Chronic tingling numbness in extremities.  Continues to have short term memory issues- on aripcet.   Appetite is good. Energy is fair. Stamina is improved. Ambulates with a walker.  Patient denies any worsening shortness of breath or cough.  However she does short of breath on exertion.  Positive for fatigue.  No fevers or chills.  No new back pain.    Review of Systems  Constitutional:  Positive for malaise/fatigue. Negative for chills, diaphoresis, fever and weight loss.  HENT:  Negative for nosebleeds and sore throat.   Eyes:  Negative for double vision.  Respiratory:  Positive for shortness of breath. Negative for cough, hemoptysis, sputum production  and wheezing.   Cardiovascular:  Negative for palpitations, orthopnea and leg swelling.  Gastrointestinal:  Negative for abdominal pain, blood in stool, constipation, diarrhea, heartburn, melena, nausea and vomiting.  Genitourinary:  Negative for dysuria, frequency and urgency.  Musculoskeletal:  Positive for back pain and joint pain.  Skin: Negative.  Negative for itching and rash.  Neurological:  Positive for tingling. Negative for dizziness, focal weakness, weakness and headaches.  Endo/Heme/Allergies:  Does not bruise/bleed easily.  Psychiatric/Behavioral:  Negative for depression. The patient is not nervous/anxious and does not have insomnia.    PAST MEDICAL HISTORY :  Past Medical History:  Diagnosis Date   Anxiety    Arthritis    Breast cancer (HCC) 1997   left breast cancer   Cancer (HCC)    Environmental and seasonal allergies    GERD (gastroesophageal reflux disease)    History of left breast cancer    Hyperlipidemia    Hypertension    Left bundle branch block (LBBB)    Neuropathy    Personal history of chemotherapy 1997   Left breast   Personal history of radiation therapy 1997   Left breast    PAST SURGICAL HISTORY :   Past Surgical History:  Procedure Laterality Date   BACK SURGERY     BREAST SURGERY     CARDIAC CATHETERIZATION     CYSTOSCOPY N/A 03/08/2022   Procedure: Foley placement;  Surgeon: Riki Altes, MD;  Location: [facility] ORS;  Service: Urology;  Laterality: N/A;   DILATION AND CURETTAGE OF UTERUS     EYE SURGERY Bilateral    Cataract Extraction with IOL   KNEE ARTHROPLASTY Right 09/14/2015  Procedure: COMPUTER ASSISTED TOTAL KNEE ARTHROPLASTY;  Surgeon: Donato Heinz, MD;  Location: [facility] ORS;  Service: Orthopedics;  Laterality: Right;   KNEE ARTHROPLASTY Left 05/30/2016   Procedure: COMPUTER ASSISTED TOTAL KNEE ARTHROPLASTY;  Surgeon: Donato Heinz, MD;  Location: [facility] ORS;  Service: Orthopedics;  Laterality: Left;   KYPHOPLASTY  2016   Dr.  Rosita Kea, [facility]   MASTECTOMY Left 1997   chemo rad mastectomy   TONSILLECTOMY      FAMILY HISTORY :   Family History  Problem Relation Age of Onset   Breast cancer Neg Hx     SOCIAL HISTORY:   Social History   Tobacco Use   Smoking status: Never   Smokeless tobacco: Never  Substance Use Topics   Alcohol use: Yes    Alcohol/week: 1.0 standard drink of alcohol    Types: 1 Glasses of wine per week    Comment: occ.   Drug use: No    ALLERGIES:  is allergic to adhesive [tape].  MEDICATIONS:  Current Outpatient Medications  Medication Sig Dispense Refill   acetaminophen (TYLENOL) 500 MG tablet Take 1,000 mg by mouth 2 (two) times daily as needed for mild pain or headache.      Apoaequorin (PREVAGEN) 10 MG CAPS Take 1 tablet by mouth daily after breakfast.     aspirin EC 81 MG tablet Take 1 tablet (81 mg total) by mouth daily. Swallow whole. 30 tablet 0   atorvastatin (LIPITOR) 40 MG tablet Take 1 tablet (40 mg total) by mouth every evening. 30 tablet 0   carvedilol (COREG) 12.5 MG tablet Take 12.5 mg by mouth 2 (two) times daily with a meal.     cetirizine (ZYRTEC) 10 MG tablet Take 10 mg by mouth daily.     Cholecalciferol (VITAMIN D3) 2000 units TABS Take 2,000 Units by mouth daily.     Cyanocobalamin (VITAMIN B-12) 1000 MCG SUBL Place under the tongue.     donepezil (ARICEPT) 5 MG tablet Take 5 mg by mouth daily.     DULoxetine (CYMBALTA) 30 MG capsule Take 30 mg by mouth daily.     fluticasone (FLONASE) 50 MCG/ACT nasal spray Place 2 sprays into both nostrils 2 (two) times daily as needed for rhinitis.     gabapentin (NEURONTIN) 100 MG capsule Take 1 capsule (100 mg total) by mouth 3 (three) times daily. 90 capsule 0   pantoprazole (PROTONIX) 40 MG tablet Take 1 tablet (40 mg total) by mouth daily. 30 tablet 0   potassium chloride (KLOR-CON) 10 MEQ tablet Take 1 tablet by mouth daily.     sennosides-docusate sodium (SENOKOT-S) 8.6-50 MG tablet Take 4 tablets by mouth at bedtime.       Azelastine HCl 137 MCG/SPRAY SOLN SMARTSIG:1 Spray(s) Both Nares Every 12 Hours PRN (Patient not taking: Reported on 11/29/2022)     clobetasol ointment (TEMOVATE) 0.05 % APPLY TO AFFECTED AREA DAILY AS NEEDED for rash on buttocks (Patient not taking: Reported on 11/29/2022)  3   clopidogrel (PLAVIX) 75 MG tablet Take 1 tablet (75 mg total) by mouth daily. (Patient not taking: Reported on 11/29/2022) 19 tablet 0   lactulose (CHRONULAC) 10 GM/15ML solution Take 30 mLs (20 g total) by mouth 2 (two) times daily as needed for severe constipation. (Patient not taking: Reported on 11/29/2022) 946 mL 0   No current facility-administered medications for this visit.    PHYSICAL EXAMINATION:  BP (!) 135/59 (BP Location: Right Arm, Patient Position: Sitting, Cuff Size: Normal)  Pulse 65   Temp 97.7 F (36.5 C) (Tympanic)   Ht 5\' 5"  (1.651 m)   Wt 151 lb 3.2 oz (68.6 kg)   SpO2 100%   BMI 25.16 kg/m   Filed Weights   11/29/22 1000  Weight: 151 lb 3.2 oz (68.6 kg)     Physical Exam HENT:     Head: Normocephalic and atraumatic.     Mouth/Throat:     Pharynx: No oropharyngeal exudate.  Eyes:     Pupils: Pupils are equal, round, and reactive to light.  Cardiovascular:     Rate and Rhythm: Normal rate and regular rhythm.  Pulmonary:     Effort: No respiratory distress.     Breath sounds: No wheezing.  Abdominal:     General: Bowel sounds are normal. There is no distension.     Palpations: Abdomen is soft. There is no mass.     Tenderness: There is no abdominal tenderness. There is no guarding or rebound.  Musculoskeletal:        General: No tenderness. Normal range of motion.     Cervical back: Normal range of motion and neck supple.  Skin:    General: Skin is warm.  Neurological:     Mental Status: She is alert and oriented to person, place, and time.  Psychiatric:        Mood and Affect: Affect normal.     LABORATORY DATA:  I have reviewed the data as listed    Component  Value Date/Time   NA 128 (L) 11/18/2022 0909   NA 132 (L) 02/02/2014 1947   K 4.3 11/18/2022 0909   K 4.3 02/02/2014 1947   CL 96 (L) 11/18/2022 0909   CL 100 02/02/2014 1947   CO2 26 11/18/2022 0909   CO2 21 02/02/2014 1947   GLUCOSE 107 (H) 11/18/2022 0909   GLUCOSE 107 (H) 02/02/2014 1947   BUN 18 11/18/2022 0909   BUN 22 (H) 02/02/2014 1947   CREATININE 0.77 11/18/2022 0909   CREATININE 0.94 02/02/2014 1947   CALCIUM 9.4 11/18/2022 0909   CALCIUM 10.2 12/08/2014 1540   PROT 7.3 11/18/2022 0909   PROT 7.7 10/21/2013 1106   ALBUMIN 3.3 (L) 11/18/2022 0909   ALBUMIN 3.3 (L) 10/21/2013 1106   AST 17 11/18/2022 0909   ALT 13 11/18/2022 0909   ALT 25 10/21/2013 1106   ALKPHOS 75 11/18/2022 0909   ALKPHOS 83 10/21/2013 1106   BILITOT 0.5 11/18/2022 0909   GFRNONAA >60 11/18/2022 0909   GFRNONAA >60 02/02/2014 1947   GFRNONAA 57 (L) 10/21/2013 1106   GFRAA >60 09/25/2019 1041   GFRAA >60 02/02/2014 1947   GFRAA >60 10/21/2013 1106    No results found for: "SPEP", "UPEP"  Lab Results  Component Value Date   WBC 6.0 11/18/2022   NEUTROABS 3.3 11/18/2022   HGB 9.9 (L) 11/18/2022   HCT 31.1 (L) 11/18/2022   MCV 85.9 11/18/2022   PLT 248 11/18/2022      Chemistry      Component Value Date/Time   NA 128 (L) 11/18/2022 0909   NA 132 (L) 02/02/2014 1947   K 4.3 11/18/2022 0909   K 4.3 02/02/2014 1947   CL 96 (L) 11/18/2022 0909   CL 100 02/02/2014 1947   CO2 26 11/18/2022 0909   CO2 21 02/02/2014 1947   BUN 18 11/18/2022 0909   BUN 22 (H) 02/02/2014 1947   CREATININE 0.77 11/18/2022 0909   CREATININE 0.94 02/02/2014 1947  Component Value Date/Time   CALCIUM 9.4 11/18/2022 0909   CALCIUM 10.2 12/08/2014 1540   ALKPHOS 75 11/18/2022 0909   ALKPHOS 83 10/21/2013 1106   AST 17 11/18/2022 0909   ALT 13 11/18/2022 0909   ALT 25 10/21/2013 1106   BILITOT 0.5 11/18/2022 0909       RADIOGRAPHIC STUDIES: I have personally reviewed the radiological images as  listed and agreed with the findings in the report. No results found.   ASSESSMENT & PLAN:  MGUS (monoclonal gammopathy of unknown significance) # MGUS-IgM lambda- M protein- OCTOBER  2023-M protein- 1.5 gm/dl; kappa lambda light chain ratio- wnl.    # However repeat SEP 2024-  M protein-1.1; K= 31- stable.mild anemia hemoglobin 9.9- [see below] overall stable; no hypercalcemia.  ESR elevated likely secondary to MGUS.  Recommend monitoring for now.  Hold off any bone marrow biopsy/PET scan at this time.  # Mild intermittent anemia- mild anemia hemoglobin 9.9 ? Slightly worse over time- E;[colo-many years ago]; AUG 2022 CT AP [chronic left quad pain]- NO lymphadenopathy.  #Recommend gentle iron [iron biglycinate; 28 mg ] 1 pill a day.  This pill is unlikely to cause stomach upset or cause constipation.    #Chronic neuropathy/mild dementia- nortryptline/ gabapentin-stable [Dr.Potter]- aricept-  stable.   #Chronically elevated ESR 80s to 90s; CRP normal. ? Related MGUS- stable.  # History of breast cancer- ER/PR positive high risk/stage III. Clinically no evidence of recurrence.  APRIL 2023- diagnostic mammogram normal- stable.   # DISPOSITION:  # follow up in 6 months- MD; 2 weeks prior- labs- cbc/cmp; LDH; ESR; MM panel; K/L light chain ratio; iron studies. Ferritin; b12; folic acid- - Dr.B  Cc; Dr.Anderson.       Earna Coder, MD 11/29/2022 11:28 AM

## 2022-11-29 NOTE — Progress Notes (Signed)
Living at [facility] assisted living now. Forms are on counter for you to sign.   She does have some trouble falling asleep, no sleep aid other than tylenol.

## 2023-03-17 ENCOUNTER — Emergency Department: Payer: Medicare Other

## 2023-03-17 ENCOUNTER — Other Ambulatory Visit: Payer: Self-pay

## 2023-03-17 ENCOUNTER — Emergency Department
Admission: EM | Admit: 2023-03-17 | Discharge: 2023-03-17 | Disposition: A | Discharge status: Home / Self Care | Payer: Medicare Other | Attending: Emergency Medicine | Admitting: Emergency Medicine

## 2023-03-17 ENCOUNTER — Encounter: Payer: Self-pay | Admitting: Emergency Medicine

## 2023-03-17 DIAGNOSIS — R0789 Other chest pain: Secondary | ICD-10-CM

## 2023-03-17 DIAGNOSIS — S29001A Unspecified injury of muscle and tendon of front wall of thorax, initial encounter: Secondary | ICD-10-CM | POA: Diagnosis present

## 2023-03-17 DIAGNOSIS — I509 Heart failure, unspecified: Secondary | ICD-10-CM | POA: Insufficient documentation

## 2023-03-17 DIAGNOSIS — X58XXXA Exposure to other specified factors, initial encounter: Secondary | ICD-10-CM | POA: Insufficient documentation

## 2023-03-17 DIAGNOSIS — S22060A Wedge compression fracture of T7-T8 vertebra, initial encounter for closed fracture: Secondary | ICD-10-CM | POA: Diagnosis not present

## 2023-03-17 DIAGNOSIS — R079 Chest pain, unspecified: Secondary | ICD-10-CM

## 2023-03-17 LAB — CBC WITH DIFFERENTIAL/PLATELET
Abs Immature Granulocytes: 0.02 10*3/uL (ref 0.00–0.07)
Basophils Absolute: 0 10*3/uL (ref 0.0–0.1)
Basophils Relative: 0 %
Eosinophils Absolute: 0.3 10*3/uL (ref 0.0–0.5)
Eosinophils Relative: 3 %
HCT: 33.7 % — ABNORMAL LOW (ref 36.0–46.0)
Hemoglobin: 11.1 g/dL — ABNORMAL LOW (ref 12.0–15.0)
Immature Granulocytes: 0 %
Lymphocytes Relative: 22 %
Lymphs Abs: 1.7 10*3/uL (ref 0.7–4.0)
MCH: 28.2 pg (ref 26.0–34.0)
MCHC: 32.9 g/dL (ref 30.0–36.0)
MCV: 85.8 fL (ref 80.0–100.0)
Monocytes Absolute: 1.1 10*3/uL — ABNORMAL HIGH (ref 0.1–1.0)
Monocytes Relative: 15 %
Neutro Abs: 4.7 10*3/uL (ref 1.7–7.7)
Neutrophils Relative %: 60 %
Platelets: 315 10*3/uL (ref 150–400)
RBC: 3.93 MIL/uL (ref 3.87–5.11)
RDW: 13.7 % (ref 11.5–15.5)
WBC: 7.8 10*3/uL (ref 4.0–10.5)
nRBC: 0 % (ref 0.0–0.2)

## 2023-03-17 LAB — LACTIC ACID, PLASMA: Lactic Acid, Venous: 0.8 mmol/L (ref 0.5–1.9)

## 2023-03-17 LAB — TROPONIN I (HIGH SENSITIVITY): Troponin I (High Sensitivity): 2 ng/L (ref <18)

## 2023-03-17 LAB — COMPREHENSIVE METABOLIC PANEL
ALT: 17 U/L (ref 0–44)
AST: 18 U/L (ref 15–41)
Albumin: 3.3 g/dL — ABNORMAL LOW (ref 3.5–5.0)
Alkaline Phosphatase: 98 U/L (ref 38–126)
Anion gap: 12 (ref 5–15)
BUN: 13 mg/dL (ref 8–23)
CO2: 22 mmol/L (ref 22–32)
Calcium: 9.7 mg/dL (ref 8.9–10.3)
Chloride: 95 mmol/L — ABNORMAL LOW (ref 98–111)
Creatinine, Ser: 0.62 mg/dL (ref 0.44–1.00)
GFR, Estimated: >60 mL/min (ref >60)
Glucose, Bld: 108 mg/dL — ABNORMAL HIGH (ref 70–99)
Potassium: 4.2 mmol/L (ref 3.5–5.1)
Sodium: 129 mmol/L — ABNORMAL LOW (ref 135–145)
Total Bilirubin: 0.8 mg/dL (ref <1.2)
Total Protein: 7.8 g/dL (ref 6.5–8.1)

## 2023-03-17 MED ORDER — ONDANSETRON HCL 4 MG/2ML IJ SOLN
4.0000 mg | Freq: Once | INTRAMUSCULAR | Status: AC
  Administered 2023-03-17: 4 mg via INTRAVENOUS
  Filled 2023-03-17: qty 2

## 2023-03-17 MED ORDER — LIDOCAINE 5 % EX PTCH: 2.0000 | MEDICATED_PATCH | Freq: Two times a day (BID) | CUTANEOUS | 0 refills | Status: AC

## 2023-03-17 MED ORDER — MORPHINE SULFATE (PF) 2 MG/ML IV SOLN
2.0000 mg | Freq: Once | INTRAVENOUS | Status: AC
  Administered 2023-03-17: 2 mg via INTRAVENOUS
  Filled 2023-03-17: qty 1

## 2023-03-17 MED ORDER — OXYCODONE HCL 5 MG PO TABS: 5.0000 mg | ORAL_TABLET | Freq: Four times a day (QID) | ORAL | 0 refills | Status: AC | PRN

## 2023-03-17 MED ORDER — HYDROCODONE-ACETAMINOPHEN 5-325 MG PO TABS
2.0000 | ORAL_TABLET | Freq: Once | ORAL | Status: AC
  Administered 2023-03-17: 2 via ORAL
  Filled 2023-03-17: qty 2

## 2023-03-17 MED ORDER — OXYCODONE-ACETAMINOPHEN 5-325 MG PO TABS
1.0000 | ORAL_TABLET | Freq: Once | ORAL | Status: AC
  Administered 2023-03-17: 1 via ORAL
  Filled 2023-03-17: qty 1

## 2023-03-17 MED ORDER — IOHEXOL 350 MG/ML SOLN
75.0000 mL | Freq: Once | INTRAVENOUS | Status: AC | PRN
  Administered 2023-03-17: 75 mL via INTRAVENOUS

## 2023-03-17 MED ORDER — LIDOCAINE 5 % EX PTCH
2.0000 | MEDICATED_PATCH | CUTANEOUS | Status: DC
  Administered 2023-03-17: 2 via TRANSDERMAL
  Filled 2023-03-17: qty 2

## 2023-03-17 NOTE — ED Triage Notes (Signed)
EMS brings pt in from [facility] assisted living; dx pneumonia recently and resulting cough led to fx ribs; taking tylenol without relief for pain

## 2023-03-17 NOTE — ED Provider Triage Note (Signed)
Emergency Medicine Provider Triage Evaluation Note  Carly Peck , a 89 y.o. female  was evaluated in triage.  Pt complains of persistent cough and right sided chest wall pain.  States she has been ill for nearly 2 weeks and completed a course of antibiotics to treat pneumonia.  She recently had a chest x-ray at a [facility] and was told she has some rib fractures from coughing.  She said that the Tylenol and Celebrex she is taking has not helped and that she is feeling worse.  Review of Systems  Positive: Right-sided chest wall pain, cough, recent pneumonia and rib fracture diagnoses Negative: Fever, nausea/vomiting, abdominal pain, acute dyspnea (except when she is coughing)  Physical Exam  BP (!) 172/74 (BP Location: Right Arm)   Pulse 72   Temp (!) 97.5 F (36.4 C) (Oral)   Resp 18   Wt 72.6 kg   SpO2 98%   BMI 26.63 kg/m  Gen:   Awake, alert, appears uncomfortable but nontoxic Resp:  Normal effort , relatively frequent cough, lung sounds are clear but patient winces when taking deep breath. MSK:   Moves extremities without difficulty  Other:  Tenderness to palpation of the right lower ribs  Medical Decision Making  Medically screening exam initiated at 6:16 AM.  Appropriate orders placed.  Carly Peck was informed that the remainder of the evaluation will be completed by another provider, this initial triage assessment does not replace that evaluation, and the importance of remaining in the ED until their evaluation is complete.     Loleta Rose, MD 03/17/23 [phone]

## 2023-03-17 NOTE — ED Triage Notes (Signed)
Pt to ED via EMS from [facility] c/o right mid and lower back pain.  States was dx with pneumonia and has finished ABX, fractured ribs from coughing.  Only taking tylenol for pain without much relief.  Dr. York Cerise in triage to see patient.

## 2023-03-17 NOTE — ED Notes (Signed)
See triage note  Presents with mid to lower back pain Recently treated for pneumonia    also has had some rib pain d/t coughing

## 2023-03-17 NOTE — ED Provider Notes (Signed)
[facility] Provider Note    Event Date/Time   First MD Initiated Contact with Patient 03/17/23 [phone]     (approximate)   History   Chest Pain   HPI  Carly Peck is a 89 y.o. female past medical history significant for hyperlipidemia, CHF, right-sided pain, who presents to the emergency department with right-sided chest pain.  Patient has a history of shingles and patient's family members were concerned about possible shingles.  States that she has been having severe pain to the right chest wall and she is not wanting to sleep or lay on the right side.  Completed a course of antibiotics for a community-acquired pneumonia.  Was evaluated recent at urgent care and told that she had a rib fracture that was caused from coughing.  States that she has ongoing pain.  Denies any new falls or trauma.  Denies any back pain or neck pain.  Has a dermatologist appointment this coming Friday for a rash on her body.     Physical Exam   Triage Vital Signs: ED Triage Vitals  Encounter Vitals Group     BP 03/17/23 0613 (!) 172/74     Systolic BP Percentile --      Diastolic BP Percentile --      Pulse Rate 03/17/23 0613 72     Resp 03/17/23 0613 18     Temp 03/17/23 0613 (!) 97.5 F (36.4 C)     Temp Source 03/17/23 0613 Oral     SpO2 03/17/23 0559 99 %     Weight 03/17/23 0615 160 lb (72.6 kg)     Height 03/17/23 0615 5\' 5"  (1.651 m)     Head Circumference --      Peak Flow --      Pain Score 03/17/23 0613 8     Pain Loc --      Pain Education --      Exclude from Growth Chart --     Most recent vital signs: Vitals:   03/17/23 1008 03/17/23 1410  BP: (!) 162/73 (!) 158/70  Pulse: 82 80  Resp: 18 18  Temp: 97.9 F (36.6 C) 98 F (36.7 C)  SpO2: 100% 100%    Physical Exam Constitutional:      Appearance: She is well-developed.  HENT:     Head: Atraumatic.  Eyes:     Conjunctiva/sclera: Conjunctivae normal.  Cardiovascular:     Rate and Rhythm:  Regular rhythm.  Pulmonary:     Effort: No respiratory distress.  Chest:     Chest wall: Tenderness present.  Abdominal:     General: There is no distension.     Tenderness: There is no abdominal tenderness.  Musculoskeletal:        General: Normal range of motion.     Cervical back: Normal range of motion.     Comments: No midline cervical, thoracic or lumbar tenderness to palpation  Skin:    General: Skin is warm.     Findings: Rash present.     Comments: Diffuse rash to upper extremity on the right side, chest wall and lower extremity.  No vesicles.  No surrounding erythema or warmth.  No desquamation.  No involvement of her mucosa.  Neurological:     Mental Status: She is alert. Mental status is at baseline.     IMPRESSION / MDM / ASSESSMENT AND PLAN / ED COURSE  I reviewed the triage vital signs and the nursing notes.  Differential  diagnosis including pulmonary embolism, pneumonia, rib fracture, musculoskeletal pain  EKG  I, Corena Herter, the attending physician, personally viewed and interpreted this ECG. Underlying left bundle branch block.  Negative Sgarbossa's criteria.  No significant ST elevation or depression.  No findings of acute ischemia or dysrhythmia. RADIOLOGY I independently reviewed imaging, my interpretation of imaging: CT scan of the chest with no obvious central pulmonary embolism.  Read as no PE.  Age-indeterminate compression fracture of T8.  No other signs of pneumothorax, fracture or pneumonia.  LABS (all labs ordered are listed, but only abnormal results are displayed) Labs interpreted as -    Labs Reviewed  CBC WITH DIFFERENTIAL/PLATELET - Abnormal; Notable for the following components:      Result Value   Hemoglobin 11.1 (*)    HCT 33.7 (*)    Monocytes Absolute 1.1 (*)    All other components within normal limits  COMPREHENSIVE METABOLIC PANEL - Abnormal; Notable for the following components:   Sodium 129 (*)    Chloride 95 (*)     Glucose, Bld 108 (*)    Albumin 3.3 (*)    All other components within normal limits  LACTIC ACID, PLASMA  TROPONIN I (HIGH SENSITIVITY)     MDM    Patient found to have chronic hyponatremia which does not appear new for the patient.  Chronic anemia but hemoglobin has improved.  Lactic acid within normal limits.  Troponin negative with no acute findings concerning for ACS.  Patient was given a small dose of IV morphine and on reevaluation had significant improvement of her pain.  States that she is feeling much better.  Given a p.o. dose of pain medication.  Has a follow-up appointment with dermatology.  No other findings concerning for pneumonia.  Will start the patient on Lidoderm patches and a short course of oxycodone to add in addition to her meloxicam and Tylenol.  Discussed close follow-up with her primary care physician and return precautions for any ongoing or worsening symptoms.  PROCEDURES:  Critical Care performed: No  Procedures  Patient's presentation is most consistent with acute presentation with potential threat to life or bodily function.   MEDICATIONS ORDERED IN ED: Medications  lidocaine (LIDODERM) 5 % 2 patch (2 patches Transdermal Patch Applied 03/17/23 1403)  HYDROcodone-acetaminophen (NORCO/VICODIN) 5-325 MG per tablet 2 tablet (2 tablets Oral Given 03/17/23 0624)  ondansetron (ZOFRAN) injection 4 mg (4 mg Intravenous Given 03/17/23 1101)  morphine (PF) 2 MG/ML injection 2 mg (2 mg Intravenous Given 03/17/23 1102)  iohexol (OMNIPAQUE) 350 MG/ML injection 75 mL (75 mLs Intravenous Contrast Given 03/17/23 1131)  oxyCODONE-acetaminophen (PERCOCET/ROXICET) 5-325 MG per tablet 1 tablet (1 tablet Oral Given 03/17/23 1402)    FINAL CLINICAL IMPRESSION(S) / ED DIAGNOSES   Final diagnoses:  Chest pain, unspecified type  Chest wall pain  Closed wedge compression fracture of T8 vertebra, initial encounter (HCC)     Rx / DC Orders   ED Discharge Orders           Ordered    oxyCODONE (ROXICODONE) 5 MG immediate release tablet  Every 6 hours PRN        03/17/23 1336    lidocaine (LIDODERM) 5 %  Every 12 hours        03/17/23 1338             Note:  This document was prepared using Dragon voice recognition software and may include unintentional dictation errors.   Corena Herter, MD 03/17/23 1710

## 2023-03-17 NOTE — Discharge Instructions (Signed)
You were seen in the emergency department for right chest wall pain.  You had a CT scan done that did not show any blood clots.  You do not have any findings of pneumonia.  You had a questionable old fracture at T8 in your spine.  You can follow-up with your primary care physician for your ongoing right sided chest wall pain.  You were given a prescription for pain medication.  You can take this in addition to Celebrex and Tylenol.  You are also given a prescription for Lidoderm patches.  You are given a prescription for narcotic pain medication, if it is too much to take 1 tablet you can break it in half and take half of a tablet.  Continue on gabapentin for your pain control.  Return for any worsening pain.  You were given a prescription for narcotic pain medications.  Take only if in severe pain.  These are very addictive medications.  These medications can make you constipated.  If you need to take more than 1-2 doses, start a stool softner.  If you become constipated, take 1 capfull of MiraLAX, can repeat untill having regular bowel movements.  Keep this medication out of reach of any children.  Thank you for choosing [place] for your health care, it was my pleasure to care for you today!  Corena Herter, MD

## 2023-05-29 ENCOUNTER — Inpatient Hospital Stay: Payer: Medicare Other | Attending: Internal Medicine

## 2023-05-29 DIAGNOSIS — C8800 Waldenstrom macroglobulinemia not having achieved remission: Secondary | ICD-10-CM | POA: Diagnosis not present

## 2023-05-29 DIAGNOSIS — E538 Deficiency of other specified B group vitamins: Secondary | ICD-10-CM | POA: Diagnosis not present

## 2023-05-29 DIAGNOSIS — R700 Elevated erythrocyte sedimentation rate: Secondary | ICD-10-CM | POA: Insufficient documentation

## 2023-05-29 DIAGNOSIS — G629 Polyneuropathy, unspecified: Secondary | ICD-10-CM | POA: Diagnosis not present

## 2023-05-29 DIAGNOSIS — Z923 Personal history of irradiation: Secondary | ICD-10-CM | POA: Insufficient documentation

## 2023-05-29 DIAGNOSIS — D649 Anemia, unspecified: Secondary | ICD-10-CM | POA: Diagnosis not present

## 2023-05-29 DIAGNOSIS — D472 Monoclonal gammopathy: Secondary | ICD-10-CM | POA: Diagnosis present

## 2023-05-29 DIAGNOSIS — F03A0 Unspecified dementia, mild, without behavioral disturbance, psychotic disturbance, mood disturbance, and anxiety: Secondary | ICD-10-CM | POA: Diagnosis not present

## 2023-05-29 DIAGNOSIS — Z9221 Personal history of antineoplastic chemotherapy: Secondary | ICD-10-CM | POA: Insufficient documentation

## 2023-05-29 DIAGNOSIS — Z853 Personal history of malignant neoplasm of breast: Secondary | ICD-10-CM | POA: Insufficient documentation

## 2023-05-29 LAB — CBC WITH DIFFERENTIAL (CANCER CENTER ONLY)
Abs Immature Granulocytes: 0.02 10*3/uL (ref 0.00–0.07)
Basophils Absolute: 0.1 10*3/uL (ref 0.0–0.1)
Basophils Relative: 1 %
Eosinophils Absolute: 0.3 10*3/uL (ref 0.0–0.5)
Eosinophils Relative: 5 %
HCT: 32.2 % — ABNORMAL LOW (ref 36.0–46.0)
Hemoglobin: 10.2 g/dL — ABNORMAL LOW (ref 12.0–15.0)
Immature Granulocytes: 0 %
Lymphocytes Relative: 16 %
Lymphs Abs: 1.1 10*3/uL (ref 0.7–4.0)
MCH: 27.4 pg (ref 26.0–34.0)
MCHC: 31.7 g/dL (ref 30.0–36.0)
MCV: 86.6 fL (ref 80.0–100.0)
Monocytes Absolute: 1 10*3/uL (ref 0.1–1.0)
Monocytes Relative: 15 %
Neutro Abs: 4.5 10*3/uL (ref 1.7–7.7)
Neutrophils Relative %: 63 %
Platelet Count: 281 10*3/uL (ref 150–400)
RBC: 3.72 MIL/uL — ABNORMAL LOW (ref 3.87–5.11)
RDW: 14.6 % (ref 11.5–15.5)
WBC Count: 7 10*3/uL (ref 4.0–10.5)
nRBC: 0 % (ref 0.0–0.2)

## 2023-05-29 LAB — CMP (CANCER CENTER ONLY)
ALT: 18 U/L (ref 0–44)
AST: 18 U/L (ref 15–41)
Albumin: 3 g/dL — ABNORMAL LOW (ref 3.5–5.0)
Alkaline Phosphatase: 108 U/L (ref 38–126)
Anion gap: 9 (ref 5–15)
BUN: 14 mg/dL (ref 8–23)
CO2: 23 mmol/L (ref 22–32)
Calcium: 9.6 mg/dL (ref 8.9–10.3)
Chloride: 99 mmol/L (ref 98–111)
Creatinine: 0.75 mg/dL (ref 0.44–1.00)
GFR, Estimated: >60 mL/min (ref >60)
Glucose, Bld: 101 mg/dL — ABNORMAL HIGH (ref 70–99)
Potassium: 4 mmol/L (ref 3.5–5.1)
Sodium: 131 mmol/L — ABNORMAL LOW (ref 135–145)
Total Bilirubin: 0.6 mg/dL (ref 0.0–1.2)
Total Protein: 7.6 g/dL (ref 6.5–8.1)

## 2023-05-29 LAB — LACTATE DEHYDROGENASE: LDH: 90 U/L — ABNORMAL LOW (ref 98–192)

## 2023-05-29 LAB — VITAMIN B12: Vitamin B-12: 2183 pg/mL — ABNORMAL HIGH (ref 180–914)

## 2023-05-29 LAB — SEDIMENTATION RATE: Sed Rate: 124 mm/h — ABNORMAL HIGH (ref 0–30)

## 2023-05-29 LAB — FOLATE: Folate: >40 ng/mL (ref >5.9)

## 2023-05-30 LAB — KAPPA/LAMBDA LIGHT CHAINS
Kappa free light chain: 8.8 mg/L (ref 3.3–19.4)
Kappa, lambda light chain ratio: 0.27 (ref 0.26–1.65)
Lambda free light chains: 33.1 mg/L — ABNORMAL HIGH (ref 5.7–26.3)

## 2023-06-01 LAB — MULTIPLE MYELOMA PANEL, SERUM
Albumin SerPl Elph-Mcnc: 2.1 g/dL — ABNORMAL LOW (ref 2.9–4.4)
Albumin/Glob SerPl: 0.4 — ABNORMAL LOW (ref 0.7–1.7)
Alpha 1: 0.5 g/dL — ABNORMAL HIGH (ref 0.0–0.4)
Alpha2 Glob SerPl Elph-Mcnc: 0.9 g/dL (ref 0.4–1.0)
B-Globulin SerPl Elph-Mcnc: 1.8 g/dL — ABNORMAL HIGH (ref 0.7–1.3)
Gamma Glob SerPl Elph-Mcnc: 2.1 g/dL — ABNORMAL HIGH (ref 0.4–1.8)
Globulin, Total: 5.4 g/dL — ABNORMAL HIGH (ref 2.2–3.9)
IgA: 22 mg/dL — ABNORMAL LOW (ref 64–422)
IgG (Immunoglobin G), Serum: 452 mg/dL — ABNORMAL LOW (ref 586–1602)
IgM (Immunoglobulin M), Srm: 2953 mg/dL — ABNORMAL HIGH (ref 26–217)
M Protein SerPl Elph-Mcnc: 1.5 g/dL — ABNORMAL HIGH
Total Protein ELP: 7.5 g/dL (ref 6.0–8.5)

## 2023-06-12 ENCOUNTER — Inpatient Hospital Stay: Payer: Medicare Other | Admitting: Internal Medicine

## 2023-06-12 ENCOUNTER — Encounter: Payer: Self-pay | Admitting: Internal Medicine

## 2023-06-12 VITALS — BP 107/53 | HR 65 | Ht 65.0 in | Wt 160.9 lb

## 2023-06-12 DIAGNOSIS — D472 Monoclonal gammopathy: Secondary | ICD-10-CM | POA: Diagnosis not present

## 2023-06-12 NOTE — Assessment & Plan Note (Addendum)
#   MGUS-IgM lambda- M protein- OCTOBER  2023-M protein- 1.5 gm/dl; kappa lambda light chain ratio- wnl.    # However repeat SEP 2024-  M protein-1.1; K= 31- stable.mild anemia hemoglobin 9.9- [see below] overall stable; no hypercalcemia.  ESR elevated likely secondary to MGUS.  Recommend monitoring for now.  Hold off any bone marrow biopsy/PET scan at this time.  # Mild intermittent anemia- mild anemia hemoglobin 9-10-  Slightly worse over time- E;[colo-many years ago]; AUG 2022 CT AP [chronic left quad pain]- NO lymphadenopathy.  Continue gentle iron [iron biglycinate; 28 mg ] 1 pill a day.   #Chronic neuropathy/mild dementia- nortryptline/ gabapentin-stable [Dr.Potter]- aricept-  stable.   #Chronically elevated ESR 80s to 90s; CRP normal. ? Related MGUS- stable.  # History of breast cancer- ER/PR positive high risk/stage III. Clinically no evidence of recurrence.  APRIL 2023- diagnostic mammogram normal- stable.   # Low B12 def- > 2000- recommend B12 every other day-   # DISPOSITION:  # follow up in 6 months- MD; 2 weeks prior- labs- cbc/cmp; LDH; ESR; MM panel; K/L light chain ratio; iron studies. Ferritin; b12; folic acid- - Dr.B  # 25 minutes face-to-face with the patient discussing the above plan of care; more than 50% of time spent on prognosis/ natural history; counseling and coordination.   Cc; Dr.Anderson.

## 2023-06-12 NOTE — Progress Notes (Signed)
 Larey Seat this morning([place]), not sure how, c/o right foot pain, pain is a 8/10. EMS was called out to get her up from the floor.  Had pneumonia in 12/24, had xrays. Had the flu 1/25.  Having trouble with itching and scratching, seeing [facility].

## 2023-06-12 NOTE — Progress Notes (Signed)
 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 degen changes; SEP 2018-M spike- 1.3 gm/dl  # [id number] A Bx-neg [headaches/diziness].   # Bil LE PN [Dr.Potter- on Neurontin]  # 1997-LEFT BREAST CA Stage III  ER-POS;s/p MASTEC;s/p chemo; s/p RT NSABP- 33; s/p Aromasin.   # ESR- Elevated ? From Monoclonal gammopathy ----------------------------------------------------   DIAGNOSIS: IgM- MGUS   CURRENT/MOST RECENT THERAPY: surveillaince    Macroglobulinemia of Waldenstrom   INTERVAL HISTORY: Patient ambulating with assistance.  Accompanied by daughter-  Ambulates with a walker.  89 year old female patient with above history of IgM-MGUS is here for follow-up.  Patient fell this morning [facility]), not sure how-mechanical. No syncope. EMS was called out to get her up from the floor.  c/o right foot pain, pain is a 8/10. Also- had pneumonia in 12/24, had xrays. Patient had the flu 1/25.  Patient also having trouble with itching and scratching, seeing [facility].  No worsening Chronic tingling numbness in extremities.  Appetite is good. Energy is fair. Patient denies any worsening shortness of breath or cough.  No fevers or chills.  No new back pain.    Review of Systems  Constitutional:  Positive for malaise/fatigue. Negative for chills, diaphoresis, fever and weight loss.  HENT:  Negative for nosebleeds and sore throat.   Eyes:  Negative for double vision.  Respiratory:  Positive for shortness of breath.  Negative for cough, hemoptysis, sputum production and wheezing.   Cardiovascular:  Negative for palpitations, orthopnea and leg swelling.  Gastrointestinal:  Negative for abdominal pain, blood in stool, constipation, diarrhea, heartburn, melena, nausea and vomiting.  Genitourinary:  Negative for dysuria, frequency and urgency.  Musculoskeletal:  Positive for back pain and joint pain.  Skin: Negative.  Negative for itching and rash.  Neurological:  Positive for tingling. Negative for dizziness, focal weakness, weakness and headaches.  Endo/Heme/Allergies:  Does not bruise/bleed easily.  Psychiatric/Behavioral:  Negative for depression. The patient is not nervous/anxious and does not have insomnia.    PAST MEDICAL HISTORY :  Past Medical History:  Diagnosis Date   Anxiety    Arthritis    Breast cancer (HCC) 1997   left breast cancer   Cancer (HCC)    Environmental and seasonal allergies    GERD (gastroesophageal reflux disease)    History of left breast cancer    Hyperlipidemia    Hypertension    Left bundle branch block (LBBB)    Neuropathy    Personal history of chemotherapy 1997   Left breast   Personal history of radiation therapy 1997   Left breast    PAST SURGICAL HISTORY :   Past Surgical History:  Procedure Laterality Date   BACK SURGERY     BREAST SURGERY     CARDIAC CATHETERIZATION     CYSTOSCOPY N/A 03/08/2022   Procedure: Foley placement;  Surgeon: Riki Altes, MD;  Location: [facility] ORS;  Service: Urology;  Laterality: N/A;   DILATION AND CURETTAGE OF UTERUS     EYE SURGERY Bilateral    Cataract Extraction with IOL   KNEE  ARTHROPLASTY Right 09/14/2015   Procedure: COMPUTER ASSISTED TOTAL KNEE ARTHROPLASTY;  Surgeon: Donato Heinz, MD;  Location: [facility] ORS;  Service: Orthopedics;  Laterality: Right;   KNEE ARTHROPLASTY Left 05/30/2016   Procedure: COMPUTER ASSISTED TOTAL KNEE ARTHROPLASTY;  Surgeon: Donato Heinz, MD;  Location: [facility] ORS;  Service: Orthopedics;   Laterality: Left;   KYPHOPLASTY  2016   Dr. Rosita Kea, [facility]   MASTECTOMY Left 1997   chemo rad mastectomy   TONSILLECTOMY      FAMILY HISTORY :   Family History  Problem Relation Age of Onset   Breast cancer Neg Hx     SOCIAL HISTORY:   Social History   Tobacco Use   Smoking status: Never   Smokeless tobacco: Never  Substance Use Topics   Alcohol use: Yes    Alcohol/week: 1.0 standard drink of alcohol    Types: 1 Glasses of wine per week    Comment: occ.   Drug use: No    ALLERGIES:  is allergic to adhesive [tape].  MEDICATIONS:  Current Outpatient Medications  Medication Sig Dispense Refill   Acetylcysteine (N-ACETYL CYSTEINE) 600 MG CAPS Take 1,200 mg by mouth in the morning and at bedtime.     aspirin EC 81 MG tablet Take 1 tablet (81 mg total) by mouth daily. Swallow whole. 30 tablet 0   atorvastatin (LIPITOR) 40 MG tablet Take 1 tablet (40 mg total) by mouth every evening. 30 tablet 0   carvedilol (COREG) 12.5 MG tablet Take 12.5 mg by mouth 2 (two) times daily with a meal.     cetirizine (ZYRTEC) 10 MG tablet Take 10 mg by mouth daily.     donepezil (ARICEPT) 5 MG tablet Take 5 mg by mouth daily.     DULoxetine (CYMBALTA) 30 MG capsule Take 30 mg by mouth daily.     Fe Bisgly-Vit C-Vit B12-FA (GENTLE IRON) 28-60-0.008-0.4 MG CAPS Take by mouth daily.     fluticasone (FLONASE) 50 MCG/ACT nasal spray Place 2 sprays into both nostrils 2 (two) times daily as needed for rhinitis.     fluticasone-salmeterol (ADVAIR) 100-50 MCG/ACT AEPB Inhale 1 puff into the lungs 2 (two) times daily.     Multiple Vitamin (MULTIVITAMIN) tablet Take 1 tablet by mouth daily.     Multiple Vitamins-Minerals (PRESERVISION AREDS PO) Take 1 capsule by mouth in the morning and at bedtime.     mupirocin ointment (BACTROBAN) 2 % Apply 1 Application topically 3 (three) times daily.     pantoprazole (PROTONIX) 40 MG tablet Take 1 tablet (40 mg total) by mouth daily. 30 tablet 0   potassium chloride  (KLOR-CON) 10 MEQ tablet Take 1 tablet by mouth daily.     sennosides-docusate sodium (SENOKOT-S) 8.6-50 MG tablet Take 4 tablets by mouth at bedtime.      acetaminophen (TYLENOL) 500 MG tablet Take 1,000 mg by mouth 2 (two) times daily as needed for mild pain or headache.  (Patient not taking: Reported on 06/12/2023)     Apoaequorin (PREVAGEN) 10 MG CAPS Take 1 tablet by mouth daily after breakfast. (Patient not taking: Reported on 06/12/2023)     Azelastine HCl 137 MCG/SPRAY SOLN SMARTSIG:1 Spray(s) Both Nares Every 12 Hours PRN (Patient not taking: Reported on 06/12/2023)     Cholecalciferol (VITAMIN D3) 2000 units TABS Take 2,000 Units by mouth daily.     clobetasol ointment (TEMOVATE) 0.05 % APPLY TO AFFECTED AREA DAILY AS NEEDED for rash on buttocks (Patient not taking: Reported on 11/29/2022)  3   clopidogrel (PLAVIX) 75 MG tablet Take 1 tablet (75 mg total) by mouth daily. (Patient not taking: Reported on 06/12/2023) 19 tablet 0   Cyanocobalamin (VITAMIN B-12) 1000 MCG SUBL Place 1 tablet under the tongue every other day.     gabapentin (NEURONTIN) 100 MG capsule Take 1 capsule (100 mg total) by mouth 3 (three) times daily. 90 capsule 0   lactulose (CHRONULAC) 10 GM/15ML solution Take 30 mLs (20 g total) by mouth 2 (two) times daily as needed for severe constipation. (Patient not taking: Reported on 06/12/2023) 946 mL 0   No current facility-administered medications for this visit.    PHYSICAL EXAMINATION:  BP (!) 107/53 (BP Location: Right Arm, Patient Position: Sitting, Cuff Size: Normal)   Pulse 65   Ht 5\' 5"  (1.651 m)   Wt 160 lb 14.4 oz (73 kg)   SpO2 97%   BMI 26.78 kg/m   Filed Weights   06/12/23 0954  Weight: 160 lb 14.4 oz (73 kg)     Physical Exam HENT:     Head: Normocephalic and atraumatic.     Mouth/Throat:     Pharynx: No oropharyngeal exudate.  Eyes:     Pupils: Pupils are equal, round, and reactive to light.  Cardiovascular:     Rate and Rhythm: Normal rate  and regular rhythm.  Pulmonary:     Effort: No respiratory distress.     Breath sounds: No wheezing.  Abdominal:     General: Bowel sounds are normal. There is no distension.     Palpations: Abdomen is soft. There is no mass.     Tenderness: There is no abdominal tenderness. There is no guarding or rebound.  Musculoskeletal:        General: No tenderness. Normal range of motion.     Cervical back: Normal range of motion and neck supple.  Skin:    General: Skin is warm.  Neurological:     Mental Status: She is alert and oriented to person, place, and time.  Psychiatric:        Mood and Affect: Affect normal.     LABORATORY DATA:  I have reviewed the data as listed    Component Value Date/Time   NA 131 (L) 05/29/2023 0959   NA 132 (L) 02/02/2014 1947   K 4.0 05/29/2023 0959   K 4.3 02/02/2014 1947   CL 99 05/29/2023 0959   CL 100 02/02/2014 1947   CO2 23 05/29/2023 0959   CO2 21 02/02/2014 1947   GLUCOSE 101 (H) 05/29/2023 0959   GLUCOSE 107 (H) 02/02/2014 1947   BUN 14 05/29/2023 0959   BUN 22 (H) 02/02/2014 1947   CREATININE 0.75 05/29/2023 0959   CREATININE 0.94 02/02/2014 1947   CALCIUM 9.6 05/29/2023 0959   CALCIUM 10.2 12/08/2014 1540   PROT 7.6 05/29/2023 0959   PROT 7.7 10/21/2013 1106   ALBUMIN 3.0 (L) 05/29/2023 0959   ALBUMIN 3.3 (L) 10/21/2013 1106   AST 18 05/29/2023 0959   ALT 18 05/29/2023 0959   ALT 25 10/21/2013 1106   ALKPHOS 108 05/29/2023 0959   ALKPHOS 83 10/21/2013 1106   BILITOT 0.6 05/29/2023 0959   GFRNONAA >60 05/29/2023 0959   GFRNONAA >60 02/02/2014 1947   GFRNONAA 57 (L) 10/21/2013 1106   GFRAA >60 09/25/2019 1041   GFRAA >60 02/02/2014 1947   GFRAA >60 10/21/2013 1106    No results found for: "SPEP", "UPEP"  Lab Results  Component Value Date  WBC 7.0 05/29/2023   NEUTROABS 4.5 05/29/2023   HGB 10.2 (L) 05/29/2023   HCT 32.2 (L) 05/29/2023   MCV 86.6 05/29/2023   PLT 281 05/29/2023      Chemistry      Component Value  Date/Time   NA 131 (L) 05/29/2023 0959   NA 132 (L) 02/02/2014 1947   K 4.0 05/29/2023 0959   K 4.3 02/02/2014 1947   CL 99 05/29/2023 0959   CL 100 02/02/2014 1947   CO2 23 05/29/2023 0959   CO2 21 02/02/2014 1947   BUN 14 05/29/2023 0959   BUN 22 (H) 02/02/2014 1947   CREATININE 0.75 05/29/2023 0959   CREATININE 0.94 02/02/2014 1947      Component Value Date/Time   CALCIUM 9.6 05/29/2023 0959   CALCIUM 10.2 12/08/2014 1540   ALKPHOS 108 05/29/2023 0959   ALKPHOS 83 10/21/2013 1106   AST 18 05/29/2023 0959   ALT 18 05/29/2023 0959   ALT 25 10/21/2013 1106   BILITOT 0.6 05/29/2023 0959       RADIOGRAPHIC STUDIES: I have personally reviewed the radiological images as listed and agreed with the findings in the report. No results found.   ASSESSMENT & PLAN:  MGUS (monoclonal gammopathy of unknown significance) # MGUS-IgM lambda- M protein- OCTOBER  2023-M protein- 1.5 gm/dl; kappa lambda light chain ratio- wnl.    # However repeat SEP 2024-  M protein-1.1; K= 31- stable.mild anemia hemoglobin 9.9- [see below] overall stable; no hypercalcemia.  ESR elevated likely secondary to MGUS.  Recommend monitoring for now.  Hold off any bone marrow biopsy/PET scan at this time.  # Mild intermittent anemia- mild anemia hemoglobin 9-10-  Slightly worse over time- E;[colo-many years ago]; AUG 2022 CT AP [chronic left quad pain]- NO lymphadenopathy.  Continue gentle iron [iron biglycinate; 28 mg ] 1 pill a day.   #Chronic neuropathy/mild dementia- nortryptline/ gabapentin-stable [Dr.Potter]- aricept-  stable.   #Chronically elevated ESR 80s to 90s; CRP normal. ? Related MGUS- stable.  # History of breast cancer- ER/PR positive high risk/stage III. Clinically no evidence of recurrence.  APRIL 2023- diagnostic mammogram normal- stable.   # Low B12 def- > 2000- recommend B12 every other day-   # DISPOSITION:  # follow up in 6 months- MD; 2 weeks prior- labs- cbc/cmp; LDH; ESR; MM panel;  K/L light chain ratio; iron studies. Ferritin; b12; folic acid- - Dr.B  # 25 minutes face-to-face with the patient discussing the above plan of care; more than 50% of time spent on prognosis/ natural history; counseling and coordination.   Cc; Dr.Anderson.        Earna Coder, MD 06/12/2023 11:11 AM

## 2023-06-21 ENCOUNTER — Other Ambulatory Visit: Payer: Self-pay | Admitting: Internal Medicine

## 2023-06-21 DIAGNOSIS — Z1231 Encounter for screening mammogram for malignant neoplasm of breast: Secondary | ICD-10-CM

## 2023-07-14 ENCOUNTER — Ambulatory Visit
Admission: RE | Admit: 2023-07-14 | Discharge: 2023-07-14 | Disposition: A | Discharge status: Home / Self Care | Source: Ambulatory Visit | Attending: Internal Medicine | Admitting: Internal Medicine

## 2023-07-14 DIAGNOSIS — Z1231 Encounter for screening mammogram for malignant neoplasm of breast: Secondary | ICD-10-CM | POA: Insufficient documentation

## 2023-11-29 ENCOUNTER — Inpatient Hospital Stay

## 2023-12-05 ENCOUNTER — Inpatient Hospital Stay: Attending: Internal Medicine

## 2023-12-05 DIAGNOSIS — F0390 Unspecified dementia without behavioral disturbance: Secondary | ICD-10-CM | POA: Insufficient documentation

## 2023-12-05 DIAGNOSIS — D649 Anemia, unspecified: Secondary | ICD-10-CM | POA: Diagnosis not present

## 2023-12-05 DIAGNOSIS — R700 Elevated erythrocyte sedimentation rate: Secondary | ICD-10-CM | POA: Diagnosis not present

## 2023-12-05 DIAGNOSIS — D472 Monoclonal gammopathy: Secondary | ICD-10-CM | POA: Diagnosis present

## 2023-12-05 DIAGNOSIS — F1090 Alcohol use, unspecified, uncomplicated: Secondary | ICD-10-CM | POA: Insufficient documentation

## 2023-12-05 DIAGNOSIS — E538 Deficiency of other specified B group vitamins: Secondary | ICD-10-CM | POA: Insufficient documentation

## 2023-12-05 DIAGNOSIS — Z853 Personal history of malignant neoplasm of breast: Secondary | ICD-10-CM | POA: Insufficient documentation

## 2023-12-05 DIAGNOSIS — Z9221 Personal history of antineoplastic chemotherapy: Secondary | ICD-10-CM | POA: Insufficient documentation

## 2023-12-05 DIAGNOSIS — Z923 Personal history of irradiation: Secondary | ICD-10-CM | POA: Insufficient documentation

## 2023-12-05 DIAGNOSIS — H9201 Otalgia, right ear: Secondary | ICD-10-CM | POA: Insufficient documentation

## 2023-12-05 DIAGNOSIS — G629 Polyneuropathy, unspecified: Secondary | ICD-10-CM | POA: Diagnosis not present

## 2023-12-05 DIAGNOSIS — Z79899 Other long term (current) drug therapy: Secondary | ICD-10-CM | POA: Insufficient documentation

## 2023-12-05 LAB — CBC WITH DIFFERENTIAL (CANCER CENTER ONLY)
Abs Immature Granulocytes: 0.06 K/uL (ref 0.00–0.07)
Basophils Absolute: 0.1 K/uL (ref 0.0–0.1)
Basophils Relative: 1 %
Eosinophils Absolute: 0.2 K/uL (ref 0.0–0.5)
Eosinophils Relative: 2 %
HCT: 33.7 % — ABNORMAL LOW (ref 36.0–46.0)
Hemoglobin: 11 g/dL — ABNORMAL LOW (ref 12.0–15.0)
Immature Granulocytes: 1 %
Lymphocytes Relative: 21 %
Lymphs Abs: 2.2 K/uL (ref 0.7–4.0)
MCH: 28.6 pg (ref 26.0–34.0)
MCHC: 32.6 g/dL (ref 30.0–36.0)
MCV: 87.5 fL (ref 80.0–100.0)
Monocytes Absolute: 1.4 K/uL — ABNORMAL HIGH (ref 0.1–1.0)
Monocytes Relative: 13 %
Neutro Abs: 6.9 K/uL (ref 1.7–7.7)
Neutrophils Relative %: 62 %
Platelet Count: 303 K/uL (ref 150–400)
RBC: 3.85 MIL/uL — ABNORMAL LOW (ref 3.87–5.11)
RDW: 15.3 % (ref 11.5–15.5)
WBC Count: 10.8 K/uL — ABNORMAL HIGH (ref 4.0–10.5)
nRBC: 0 % (ref 0.0–0.2)

## 2023-12-05 LAB — CMP (CANCER CENTER ONLY)
ALT: 17 U/L (ref 0–44)
AST: 16 U/L (ref 15–41)
Albumin: 3.1 g/dL — ABNORMAL LOW (ref 3.5–5.0)
Alkaline Phosphatase: 87 U/L (ref 38–126)
Anion gap: 8 (ref 5–15)
BUN: 18 mg/dL (ref 8–23)
CO2: 25 mmol/L (ref 22–32)
Calcium: 9.7 mg/dL (ref 8.9–10.3)
Chloride: 98 mmol/L (ref 98–111)
Creatinine: 0.81 mg/dL (ref 0.44–1.00)
GFR, Estimated: >60 mL/min (ref >60)
Glucose, Bld: 90 mg/dL (ref 70–99)
Potassium: 3.7 mmol/L (ref 3.5–5.1)
Sodium: 131 mmol/L — ABNORMAL LOW (ref 135–145)
Total Bilirubin: 0.7 mg/dL (ref 0.0–1.2)
Total Protein: 8.1 g/dL (ref 6.5–8.1)

## 2023-12-05 LAB — IRON AND TIBC
Iron: 104 ug/dL (ref 28–170)
Saturation Ratios: 34 % — ABNORMAL HIGH (ref 10.4–31.8)
TIBC: 302 ug/dL (ref 250–450)
UIBC: 198 ug/dL

## 2023-12-05 LAB — FERRITIN: Ferritin: 20 ng/mL (ref 11–307)

## 2023-12-05 LAB — SEDIMENTATION RATE: Sed Rate: 116 mm/h — ABNORMAL HIGH (ref 0–30)

## 2023-12-05 LAB — FOLATE: Folate: >20 ng/mL (ref >5.9)

## 2023-12-05 LAB — VITAMIN B12: Vitamin B-12: 1317 pg/mL — ABNORMAL HIGH (ref 180–914)

## 2023-12-05 LAB — LACTATE DEHYDROGENASE: LDH: 72 U/L — ABNORMAL LOW (ref 98–192)

## 2023-12-06 LAB — KAPPA/LAMBDA LIGHT CHAINS
Kappa free light chain: 8.2 mg/L (ref 3.3–19.4)
Kappa, lambda light chain ratio: 0.18 — ABNORMAL LOW (ref 0.26–1.65)
Lambda free light chains: 44.9 mg/L — ABNORMAL HIGH (ref 5.7–26.3)

## 2023-12-07 LAB — MULTIPLE MYELOMA PANEL, SERUM
Albumin SerPl Elph-Mcnc: 3.4 g/dL (ref 2.9–4.4)
Albumin/Glob SerPl: 0.8 (ref 0.7–1.7)
Alpha 1: 0.5 g/dL — ABNORMAL HIGH (ref 0.0–0.4)
Alpha2 Glob SerPl Elph-Mcnc: 1.1 g/dL — ABNORMAL HIGH (ref 0.4–1.0)
B-Globulin SerPl Elph-Mcnc: 2.7 g/dL — ABNORMAL HIGH (ref 0.7–1.3)
Gamma Glob SerPl Elph-Mcnc: 0.4 g/dL (ref 0.4–1.8)
Globulin, Total: 4.8 g/dL — ABNORMAL HIGH (ref 2.2–3.9)
IgA: 12 mg/dL — ABNORMAL LOW (ref 64–422)
IgG (Immunoglobin G), Serum: 410 mg/dL — ABNORMAL LOW (ref 586–1602)
IgM (Immunoglobulin M), Srm: 3817 mg/dL — ABNORMAL HIGH (ref 26–217)
M Protein SerPl Elph-Mcnc: 1.8 g/dL — ABNORMAL HIGH
Total Protein ELP: 8.2 g/dL (ref 6.0–8.5)

## 2023-12-13 ENCOUNTER — Encounter: Payer: Self-pay | Admitting: Internal Medicine

## 2023-12-13 ENCOUNTER — Inpatient Hospital Stay: Admitting: Internal Medicine

## 2023-12-13 VITALS — BP 117/59 | HR 74 | Resp 18 | Ht 65.0 in | Wt 171.5 lb

## 2023-12-13 DIAGNOSIS — D472 Monoclonal gammopathy: Secondary | ICD-10-CM

## 2023-12-13 NOTE — Progress Notes (Signed)
 C/o ear ache last night, no fever. Was treated for inflamed TMJ 3 weeks ago, finished 1 week ago.  Saw Dr. Lane 11/01/23, dx memory loss hx stroke.

## 2023-12-13 NOTE — Assessment & Plan Note (Addendum)
#    2016-MGUS-IgM lambda- M protein- # However repeat SEP 2025-  M protein-1.8; K= 31-  stable- overa ll stable; no hypercalcemia.  ESR elevated likely secondary to MGUS.  Recommend monitoring for now.  Hold off any bone marrow biopsy/PET scan at this time.  # Mild intermittent anemia- mild anemia hemoglobin 9-10-  Slightly worse over time- E;[colo-many years ago]; AUG 2022 CT AP [chronic left quad pain]- NO lymphadenopathy.  Continue gentle iron [iron biglycinate; 28 mg ] 1 pill a day.   # Right ear ache- Hx of TMJ- s/p prednisone - worse again- defer to PCP.   #Chronic neuropathy/mild dementia- nortryptline/ gabapentin -stable [Dr.Potter]- aricept-  stable.   #Chronically elevated ESR 80s to 90s; CRP normal. ? Related MGUS- stable.  # History of breast cancer- ER/PR positive high risk/stage III. Clinically no evidence of recurrence.  APRIL 2023- diagnostic mammogram normal- stable.   # Low B12 def- > 2000- recommend B12 every other day-   # DISPOSITION:  # follow up in 6 months- MD; 2 weeks prior- labs- cbc/cmp; LDH; ESR; MM panel; K/L light chain ratio; iron studies. Ferritin; b12; folic acid - - Dr.B  Cc; Dr.Anderson.

## 2023-12-13 NOTE — Progress Notes (Signed)
 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 degen changes; SEP 2018-M spike- 1.3 gm/dl  # [id number] A Bx-neg [headaches/diziness].   # Bil LE PN [Dr.Potter- on Neurontin ]  # 1997-LEFT BREAST CA Stage III  ER-POS;s/p MASTEC;s/p chemo; s/p RT NSABP- 33; s/p Aromasin.   # ESR- Elevated ? From Monoclonal gammopathy ----------------------------------------------------   DIAGNOSIS: IgM- MGUS   CURRENT/MOST RECENT THERAPY: surveillaince    Macroglobulinemia of Waldenstrom   INTERVAL HISTORY: Patient ambulating with assistance.  Accompanied by daughter-  Ambulates with a walker.  89 year old female patient with above history of IgM-MGUS- with mild to moderate dementia- Hx of stroke [Dr.Potter] is here for follow-up.  No worsening Chronic tingling numbness in extremities. Appetite is good. Energy is fair. Patient denies any worsening shortness of breath or cough.  No fevers or chills.  No new back pain.    Review of Systems  Constitutional:  Positive for malaise/fatigue. Negative for chills, diaphoresis, fever and weight loss.  HENT:  Negative for nosebleeds and sore throat.   Eyes:  Negative for double vision.  Respiratory:  Positive for shortness of breath. Negative for cough, hemoptysis, sputum production and wheezing.   Cardiovascular:  Negative for palpitations, orthopnea and leg swelling.  Gastrointestinal:  Negative for abdominal pain, blood in stool, constipation, diarrhea, heartburn, melena, nausea and vomiting.   Genitourinary:  Negative for dysuria, frequency and urgency.  Musculoskeletal:  Positive for back pain and joint pain.  Skin: Negative.  Negative for itching and rash.  Neurological:  Positive for tingling. Negative for dizziness, focal weakness, weakness and headaches.  Endo/Heme/Allergies:  Does not bruise/bleed easily.  Psychiatric/Behavioral:  Negative for depression. The patient is not nervous/anxious and does not have insomnia.    PAST MEDICAL HISTORY :  Past Medical History:  Diagnosis Date   Anxiety    Arthritis    Breast cancer (HCC) 1997   left breast cancer   Cancer (HCC)    Environmental and seasonal allergies    GERD (gastroesophageal reflux disease)    History of left breast cancer    Hyperlipidemia    Hypertension    Left bundle branch block (LBBB)    Neuropathy    Personal history of chemotherapy 1997   Left breast   Personal history of radiation therapy 1997   Left breast    PAST SURGICAL HISTORY :   Past Surgical History:  Procedure Laterality Date   BACK SURGERY     BREAST SURGERY     CARDIAC CATHETERIZATION     CYSTOSCOPY N/A 03/08/2022   Procedure: Foley placement;  Surgeon: Twylla Glendia BROCKS, MD;  Location: [facility] ORS;  Service: Urology;  Laterality: N/A;   DILATION AND CURETTAGE OF UTERUS     EYE SURGERY Bilateral    Cataract Extraction with IOL   KNEE ARTHROPLASTY Right 09/14/2015   Procedure: COMPUTER ASSISTED TOTAL KNEE ARTHROPLASTY;  Surgeon: Lynwood SHAUNNA Hue, MD;  Location: [facility] ORS;  Service: Orthopedics;  Laterality: Right;   KNEE ARTHROPLASTY Left 05/30/2016   Procedure: COMPUTER ASSISTED TOTAL KNEE ARTHROPLASTY;  Surgeon: Lynwood SHAUNNA Hue, MD;  Location: [facility] ORS;  Service: Orthopedics;  Laterality: Left;   KYPHOPLASTY  2016   Dr. Kathlynn, [facility]   MASTECTOMY Left 1997   chemo rad mastectomy   TONSILLECTOMY      FAMILY HISTORY :   Family History  Problem Relation Age of Onset   Breast cancer Neg Hx     SOCIAL HISTORY:   Social History    Tobacco Use   Smoking status: Never   Smokeless tobacco: Never  Substance Use Topics   Alcohol use: Yes    Alcohol/week: 1.0 standard drink of alcohol    Types: 1 Glasses of wine per week    Comment: occ.   Drug use: No    ALLERGIES:  is allergic to adhesive [tape].  MEDICATIONS:  Current Outpatient Medications  Medication Sig Dispense Refill   aspirin  EC 81 MG tablet Take 1 tablet (81 mg total) by mouth daily. Swallow whole. 30 tablet 0   atorvastatin  (LIPITOR) 40 MG tablet Take 1 tablet (40 mg total) by mouth every evening. 30 tablet 0   carvedilol  (COREG ) 12.5 MG tablet Take 12.5 mg by mouth 2 (two) times daily with a meal.     cetirizine (ZYRTEC) 10 MG tablet Take 10 mg by mouth daily.     Cholecalciferol  (VITAMIN D3) 2000 units TABS Take 2,000 Units by mouth daily.     Cyanocobalamin  (VITAMIN B-12) 1000 MCG SUBL Place 1 tablet under the tongue every other day.     DULoxetine  (CYMBALTA ) 30 MG capsule Take 30 mg by mouth daily.     Fe Bisgly-Vit C-Vit B12-FA (GENTLE IRON) 28-60-0.008-0.4 MG CAPS Take by mouth daily.     fluticasone  (FLONASE ) 50 MCG/ACT nasal spray Place 2 sprays into both nostrils 2 (two) times daily as needed for rhinitis.     fluticasone -salmeterol (ADVAIR) 100-50 MCG/ACT AEPB Inhale 1 puff into the lungs 2 (two) times daily.     mupirocin ointment (BACTROBAN) 2 % Apply 1 Application topically 3 (three) times daily.     No current facility-administered medications for this visit.    PHYSICAL EXAMINATION:  BP (!) 117/59 (BP Location: Right Arm, Patient Position: Sitting, Cuff Size: Normal)   Pulse 74   Resp 18   Ht 5' 5 (1.651 m)   Wt 171 lb 8 oz (77.8 kg)   SpO2 99%   BMI 28.54 kg/m   Filed Weights   12/13/23 0954  Weight: 171 lb 8 oz (77.8 kg)     Physical Exam HENT:     Head: Normocephalic and atraumatic.     Mouth/Throat:     Pharynx: No oropharyngeal exudate.  Eyes:     Pupils: Pupils are equal, round, and reactive to light.   Cardiovascular:     Rate and Rhythm: Normal rate and regular rhythm.  Pulmonary:     Effort: No respiratory distress.     Breath sounds: No wheezing.  Abdominal:     General: Bowel sounds are normal. There is no distension.     Palpations: Abdomen is soft. There is no mass.     Tenderness: There is no abdominal tenderness. There is no guarding or rebound.  Musculoskeletal:        General: No tenderness. Normal range of motion.     Cervical back: Normal range of motion and neck supple.  Skin:    General: Skin is warm.  Neurological:     Mental Status: She is alert and oriented to person, place, and time.  Psychiatric:  Mood and Affect: Affect normal.     LABORATORY DATA:  I have reviewed the data as listed    Component Value Date/Time   NA 131 (L) 12/05/2023 0951   NA 132 (L) 02/02/2014 1947   K 3.7 12/05/2023 0951   K 4.3 02/02/2014 1947   CL 98 12/05/2023 0951   CL 100 02/02/2014 1947   CO2 25 12/05/2023 0951   CO2 21 02/02/2014 1947   GLUCOSE 90 12/05/2023 0951   GLUCOSE 107 (H) 02/02/2014 1947   BUN 18 12/05/2023 0951   BUN 22 (H) 02/02/2014 1947   CREATININE 0.81 12/05/2023 0951   CREATININE 0.94 02/02/2014 1947   CALCIUM  9.7 12/05/2023 0951   CALCIUM  10.2 12/08/2014 1540   PROT 8.1 12/05/2023 0951   PROT 7.7 10/21/2013 1106   ALBUMIN 3.1 (L) 12/05/2023 0951   ALBUMIN 3.3 (L) 10/21/2013 1106   AST 16 12/05/2023 0951   ALT 17 12/05/2023 0951   ALT 25 10/21/2013 1106   ALKPHOS 87 12/05/2023 0951   ALKPHOS 83 10/21/2013 1106   BILITOT 0.7 12/05/2023 0951   GFRNONAA >60 12/05/2023 0951   GFRNONAA >60 02/02/2014 1947   GFRNONAA 57 (L) 10/21/2013 1106   GFRAA >60 09/25/2019 1041   GFRAA >60 02/02/2014 1947   GFRAA >60 10/21/2013 1106    No results found for: SPEP, UPEP  Lab Results  Component Value Date   WBC 10.8 (H) 12/05/2023   NEUTROABS 6.9 12/05/2023   HGB 11.0 (L) 12/05/2023   HCT 33.7 (L) 12/05/2023   MCV 87.5 12/05/2023   PLT 303  12/05/2023      Chemistry      Component Value Date/Time   NA 131 (L) 12/05/2023 0951   NA 132 (L) 02/02/2014 1947   K 3.7 12/05/2023 0951   K 4.3 02/02/2014 1947   CL 98 12/05/2023 0951   CL 100 02/02/2014 1947   CO2 25 12/05/2023 0951   CO2 21 02/02/2014 1947   BUN 18 12/05/2023 0951   BUN 22 (H) 02/02/2014 1947   CREATININE 0.81 12/05/2023 0951   CREATININE 0.94 02/02/2014 1947      Component Value Date/Time   CALCIUM  9.7 12/05/2023 0951   CALCIUM  10.2 12/08/2014 1540   ALKPHOS 87 12/05/2023 0951   ALKPHOS 83 10/21/2013 1106   AST 16 12/05/2023 0951   ALT 17 12/05/2023 0951   ALT 25 10/21/2013 1106   BILITOT 0.7 12/05/2023 0951       RADIOGRAPHIC STUDIES: I have personally reviewed the radiological images as listed and agreed with the findings in the report. No results found.   ASSESSMENT & PLAN:  MGUS (monoclonal gammopathy of unknown significance) #  2016-MGUS-IgM lambda- M protein- # However repeat SEP 2025-  M protein-1.8; K= 31-  stable- overa ll stable; no hypercalcemia.  ESR elevated likely secondary to MGUS.  Recommend monitoring for now.  Hold off any bone marrow biopsy/PET scan at this time.  # Mild intermittent anemia- mild anemia hemoglobin 9-10-  Slightly worse over time- E;[colo-many years ago]; AUG 2022 CT AP [chronic left quad pain]- NO lymphadenopathy.  Continue gentle iron [iron biglycinate; 28 mg ] 1 pill a day.   # Right ear ache- Hx of TMJ- s/p prednisone - worse again- defer to PCP.   #Chronic neuropathy/mild dementia- nortryptline/ gabapentin -stable [Dr.Potter]- aricept-  stable.   #Chronically elevated ESR 80s to 90s; CRP normal. ? Related MGUS- stable.  # History of breast cancer- ER/PR positive high risk/stage III. Clinically no evidence  of recurrence.  APRIL 2023- diagnostic mammogram normal- stable.   # Low B12 def- > 2000- recommend B12 every other day-   # DISPOSITION:  # follow up in 6 months- MD; 2 weeks prior- labs- cbc/cmp; LDH;  ESR; MM panel; K/L light chain ratio; iron studies. Ferritin; b12; folic acid - - Dr.B  Cc; Dr.Anderson.      Cindy JONELLE Joe, MD 12/13/2023 11:31 AM

## 2024-02-26 ENCOUNTER — Observation Stay
Admission: EM | Admit: 2024-02-26 | Discharge: 2024-02-29 | DRG: 291 | Disposition: A | Source: Skilled Nursing Facility | Attending: Hospitalist | Admitting: Hospitalist

## 2024-02-26 ENCOUNTER — Emergency Department

## 2024-02-26 ENCOUNTER — Other Ambulatory Visit: Payer: Self-pay

## 2024-02-26 DIAGNOSIS — I5031 Acute diastolic (congestive) heart failure: Secondary | ICD-10-CM

## 2024-02-26 DIAGNOSIS — D472 Monoclonal gammopathy: Secondary | ICD-10-CM | POA: Diagnosis present

## 2024-02-26 DIAGNOSIS — J441 Chronic obstructive pulmonary disease with (acute) exacerbation: Secondary | ICD-10-CM | POA: Diagnosis present

## 2024-02-26 DIAGNOSIS — Z8673 Personal history of transient ischemic attack (TIA), and cerebral infarction without residual deficits: Secondary | ICD-10-CM

## 2024-02-26 DIAGNOSIS — I10 Essential (primary) hypertension: Secondary | ICD-10-CM | POA: Diagnosis present

## 2024-02-26 DIAGNOSIS — F418 Other specified anxiety disorders: Secondary | ICD-10-CM | POA: Diagnosis present

## 2024-02-26 DIAGNOSIS — I509 Heart failure, unspecified: Principal | ICD-10-CM

## 2024-02-26 DIAGNOSIS — J40 Bronchitis, not specified as acute or chronic: Secondary | ICD-10-CM

## 2024-02-26 DIAGNOSIS — E785 Hyperlipidemia, unspecified: Secondary | ICD-10-CM | POA: Diagnosis present

## 2024-02-26 LAB — RESP PANEL BY RT-PCR (RSV, FLU A&B, COVID)  RVPGX2
Influenza A by PCR: NEGATIVE
Influenza B by PCR: NEGATIVE
Resp Syncytial Virus by PCR: NEGATIVE
SARS Coronavirus 2 by RT PCR: NEGATIVE

## 2024-02-26 LAB — PROCALCITONIN: Procalcitonin: <0.1 ng/mL

## 2024-02-26 LAB — BASIC METABOLIC PANEL WITH GFR
Anion gap: 12 (ref 5–15)
BUN: 14 mg/dL (ref 8–23)
CO2: 22 mmol/L (ref 22–32)
Calcium: 9.9 mg/dL (ref 8.9–10.3)
Chloride: 96 mmol/L — ABNORMAL LOW (ref 98–111)
Creatinine, Ser: 0.82 mg/dL (ref 0.44–1.00)
GFR, Estimated: >60 mL/min (ref >60)
Glucose, Bld: 110 mg/dL — ABNORMAL HIGH (ref 70–99)
Potassium: 4.2 mmol/L (ref 3.5–5.1)
Sodium: 131 mmol/L — ABNORMAL LOW (ref 135–145)

## 2024-02-26 LAB — CBC
HCT: 32.5 % — ABNORMAL LOW (ref 36.0–46.0)
Hemoglobin: 10.6 g/dL — ABNORMAL LOW (ref 12.0–15.0)
MCH: 28.3 pg (ref 26.0–34.0)
MCHC: 32.6 g/dL (ref 30.0–36.0)
MCV: 86.7 fL (ref 80.0–100.0)
Platelets: 225 K/uL (ref 150–400)
RBC: 3.75 MIL/uL — ABNORMAL LOW (ref 3.87–5.11)
RDW: 15.1 % (ref 11.5–15.5)
WBC: 7.2 K/uL (ref 4.0–10.5)
nRBC: 0 % (ref 0.0–0.2)

## 2024-02-26 LAB — TROPONIN T, HIGH SENSITIVITY
Troponin T High Sensitivity: 27 ng/L — ABNORMAL HIGH (ref 0–19)
Troponin T High Sensitivity: 26 ng/L — ABNORMAL HIGH (ref 0–19)

## 2024-02-26 LAB — PRO BRAIN NATRIURETIC PEPTIDE: Pro Brain Natriuretic Peptide: 1613 pg/mL — ABNORMAL HIGH (ref <300.0)

## 2024-02-26 MED ORDER — SODIUM CHLORIDE 0.9 % IV SOLN: 2.0000 g | Freq: Once | INTRAVENOUS | Status: DC

## 2024-02-26 MED ORDER — FUROSEMIDE 10 MG/ML IJ SOLN
20.0000 mg | Freq: Once | INTRAMUSCULAR | Status: AC
  Administered 2024-02-26: 20 mg via INTRAVENOUS
  Filled 2024-02-26: qty 4

## 2024-02-26 MED ORDER — FUROSEMIDE 10 MG/ML IJ SOLN
20.0000 mg | Freq: Two times a day (BID) | INTRAMUSCULAR | Status: DC
  Administered 2024-02-26 – 2024-02-27 (×3): 20 mg via INTRAVENOUS
  Filled 2024-02-26: qty 2
  Filled 2024-02-26: qty 4
  Filled 2024-02-26: qty 2

## 2024-02-26 MED ORDER — IPRATROPIUM-ALBUTEROL 0.5-2.5 (3) MG/3ML IN SOLN: 3.0000 mL | Freq: Four times a day (QID) | RESPIRATORY_TRACT | Status: DC | PRN

## 2024-02-26 MED ORDER — ENOXAPARIN SODIUM 40 MG/0.4ML IJ SOSY
40.0000 mg | PREFILLED_SYRINGE | INTRAMUSCULAR | Status: DC
  Administered 2024-02-26 – 2024-02-28 (×3): 40 mg via SUBCUTANEOUS
  Filled 2024-02-26 (×3): qty 0.4

## 2024-02-26 MED ORDER — ONDANSETRON HCL 4 MG PO TABS: 4.0000 mg | ORAL_TABLET | Freq: Four times a day (QID) | ORAL | Status: DC | PRN

## 2024-02-26 MED ORDER — ATORVASTATIN CALCIUM 20 MG PO TABS
40.0000 mg | ORAL_TABLET | Freq: Every evening | ORAL | Status: DC
  Administered 2024-02-26 – 2024-02-28 (×3): 40 mg via ORAL
  Filled 2024-02-26 (×3): qty 2

## 2024-02-26 MED ORDER — CARVEDILOL 12.5 MG PO TABS
12.5000 mg | ORAL_TABLET | Freq: Two times a day (BID) | ORAL | Status: DC
  Administered 2024-02-27 – 2024-02-29 (×5): 12.5 mg via ORAL
  Filled 2024-02-26 (×2): qty 1
  Filled 2024-02-26: qty 4
  Filled 2024-02-26 (×3): qty 1

## 2024-02-26 MED ORDER — METHYLPREDNISOLONE SODIUM SUCC 40 MG IJ SOLR
40.0000 mg | Freq: Two times a day (BID) | INTRAMUSCULAR | Status: DC
  Administered 2024-02-26 – 2024-02-27 (×3): 40 mg via INTRAVENOUS
  Filled 2024-02-26 (×3): qty 1

## 2024-02-26 MED ORDER — SODIUM CHLORIDE 0.9 % IV BOLUS: 500.0000 mL | Freq: Once | INTRAVENOUS | Status: DC

## 2024-02-26 MED ORDER — IPRATROPIUM-ALBUTEROL 0.5-2.5 (3) MG/3ML IN SOLN
3.0000 mL | Freq: Once | RESPIRATORY_TRACT | Status: AC
  Administered 2024-02-26: 3 mL via RESPIRATORY_TRACT
  Filled 2024-02-26: qty 3

## 2024-02-26 MED ORDER — IPRATROPIUM-ALBUTEROL 0.5-2.5 (3) MG/3ML IN SOLN
6.0000 mL | Freq: Once | RESPIRATORY_TRACT | Status: AC
  Administered 2024-02-26: 6 mL via RESPIRATORY_TRACT
  Filled 2024-02-26: qty 6

## 2024-02-26 MED ORDER — PANTOPRAZOLE SODIUM 40 MG IV SOLR
40.0000 mg | Freq: Two times a day (BID) | INTRAVENOUS | Status: DC
  Administered 2024-02-26 – 2024-02-28 (×6): 40 mg via INTRAVENOUS
  Filled 2024-02-26 (×6): qty 10

## 2024-02-26 MED ORDER — ONDANSETRON HCL 4 MG/2ML IJ SOLN: 4.0000 mg | Freq: Four times a day (QID) | INTRAMUSCULAR | Status: DC | PRN

## 2024-02-26 MED ORDER — ACETAMINOPHEN 650 MG RE SUPP: 650.0000 mg | Freq: Four times a day (QID) | RECTAL | Status: DC | PRN

## 2024-02-26 MED ORDER — DOXYCYCLINE HYCLATE 100 MG PO TABS: 100.0000 mg | ORAL_TABLET | Freq: Once | ORAL | Status: DC

## 2024-02-26 MED ORDER — POLYETHYLENE GLYCOL 3350 17 G PO PACK: 17.0000 g | PACK | Freq: Every day | ORAL | Status: DC | PRN

## 2024-02-26 MED ORDER — ACETAMINOPHEN 500 MG PO TABS
1000.0000 mg | ORAL_TABLET | Freq: Once | ORAL | Status: AC
  Administered 2024-02-26: 1000 mg via ORAL
  Filled 2024-02-26: qty 2

## 2024-02-26 MED ORDER — METHYLPREDNISOLONE SODIUM SUCC 125 MG IJ SOLR
125.0000 mg | Freq: Once | INTRAMUSCULAR | Status: AC
  Administered 2024-02-26: 125 mg via INTRAVENOUS
  Filled 2024-02-26: qty 2

## 2024-02-26 MED ORDER — ACETAMINOPHEN 325 MG PO TABS
650.0000 mg | ORAL_TABLET | Freq: Four times a day (QID) | ORAL | Status: DC | PRN
  Administered 2024-02-26: 650 mg via ORAL
  Filled 2024-02-26: qty 2

## 2024-02-26 MED ORDER — ASPIRIN 81 MG PO TBEC
81.0000 mg | DELAYED_RELEASE_TABLET | Freq: Every day | ORAL | Status: DC
  Administered 2024-02-26 – 2024-02-29 (×4): 81 mg via ORAL
  Filled 2024-02-26 (×4): qty 1

## 2024-02-26 MED ORDER — DULOXETINE HCL 30 MG PO CPEP
30.0000 mg | ORAL_CAPSULE | Freq: Every day | ORAL | Status: DC
  Administered 2024-02-26 – 2024-02-29 (×4): 30 mg via ORAL
  Filled 2024-02-26 (×4): qty 1

## 2024-02-26 MED ORDER — OXYCODONE HCL 5 MG PO TABS
5.0000 mg | ORAL_TABLET | ORAL | Status: DC | PRN
  Administered 2024-02-27: 5 mg via ORAL
  Filled 2024-02-26: qty 1

## 2024-02-26 NOTE — ED Provider Notes (Addendum)
 [facility] Provider Note    Event Date/Time   First MD Initiated Contact with Patient 02/26/24 [phone]     (approximate)   History   Shortness of Breath   HPI  Carly Peck is a 89 y.o. female with left middle branch block, prior stroke on aspirin  who comes in with concerns for shortness of breath.  Patient has had shortness of breath cough and wheezing.  Kept her up all night.  No history of COPD, asthma.  Did have pneumonia last year that was similar to this.  Patient does report some congestion and some pain in her right ear.  Family report that typically when she gets congestion she has discomfort in this ear.  Patient lives with her husband in independent living and he also has had some congestion and respiratory symptoms but not as bad.  They deny any known falls.  They do report that she just seems more weak and uncomfortable.  I reviewed a note where on 01/16/2024 patient was treated with prednisone  and doxycycline  for concerns for bronchitis. Reviewed a note on 03/08/2023 where patient was on amoxicillin and doxycycline  due to concerns for cough and malaise  Physical Exam   Triage Vital Signs: ED Triage Vitals  Encounter Vitals Group     BP 02/26/24 0841 132/65     Girls Systolic BP Percentile --      Girls Diastolic BP Percentile --      Boys Systolic BP Percentile --      Boys Diastolic BP Percentile --      Pulse Rate 02/26/24 0841 99     Resp 02/26/24 0841 20     Temp 02/26/24 0841 99.5 F (37.5 C)     Temp src --      SpO2 02/26/24 0841 97 %     Weight 02/26/24 0839 171 lb 8.3 oz (77.8 kg)     Height 02/26/24 0839 5' 5 (1.651 m)     Head Circumference --      Peak Flow --      Pain Score --      Pain Loc --      Pain Education --      Exclude from Growth Chart --     Most recent vital signs: Vitals:   02/26/24 0841  BP: 132/65  Pulse: 99  Resp: 20  Temp: 99.5 F (37.5 C)  SpO2: 97%     General: Awake, no distress.   CV:  Good peripheral perfusion.  Resp:  Normal effort.  Slight wheeze may be noted in the right lung left lung appears normal Abd:  No distention.  Soft and nontender Other:  Mild Edema noted bilaterally equal in size no calf tenderness   ED Results / Procedures / Treatments   Labs (all labs ordered are listed, but only abnormal results are displayed) Labs Reviewed  RESP PANEL BY RT-PCR (RSV, FLU A&B, COVID)  [id number]  BASIC METABOLIC PANEL WITH GFR  CBC  PRO BRAIN NATRIURETIC PEPTIDE  PROCALCITONIN  TROPONIN T, HIGH SENSITIVITY     EKG  My interpretation of EKG:  Sinus tachycardia rate of 100 without any ST elevation except for some minimal in V2 and V3 with left bundle branch block, reviewed patient's prior EKG and does look similar.  RADIOLOGY I have reviewed the xray personally and interpreted no focal pneumonia noted   PROCEDURES:  Critical Care performed: No  .1-3 Lead EKG Interpretation  Performed by: Ernest Ronal BRAVO,  MD Authorized by: Ernest Ronal BRAVO, MD     Interpretation: normal     ECG rate:  90   ECG rate assessment: normal     Rhythm: sinus rhythm     Ectopy: none     Conduction: normal      MEDICATIONS ORDERED IN ED: Medications  furosemide  (LASIX ) injection 20 mg (has no administration in time range)  ipratropium-albuterol  (DUONEB) 0.5-2.5 (3) MG/3ML nebulizer solution 6 mL (has no administration in time range)  acetaminophen  (TYLENOL ) tablet 1,000 mg (1,000 mg Oral Given 02/26/24 0952)  ipratropium-albuterol  (DUONEB) 0.5-2.5 (3) MG/3ML nebulizer solution 3 mL (3 mLs Nebulization Given 02/26/24 0953)  methylPREDNISolone  sodium succinate (SOLU-MEDROL ) 125 mg/2 mL injection 125 mg (125 mg Intravenous Given 02/26/24 0954)     IMPRESSION / MDM / ASSESSMENT AND PLAN / ED COURSE  I reviewed the triage vital signs and the nursing notes.   Patient's presentation is most consistent with acute presentation with potential threat to life or bodily function.     Patient comes in with concerns for shortness of breath, wheezing.  Initially patient did not have really significant wheezing and she looked pretty comfortable in bed low-grade temperature noted.  Workup was done to evaluate for ACS, CHF, pneumonia, COVID, flu.  No unilateral leg swelling and given her cough, congestion and wheezing this seems more consistent with infectious process then with pulmonary embolism.  9:44 AM when went to go reevaluate patient there was some pursed breathing and audible wheezing noted.  At my relisten the lung she had bilateral wheezing noted and slightly increased work of breathing will trial a DuoNeb and give a dose of steroids.  Procalcitonin was negative.  CBC is reassuring CMP shows daily low sodium.  Troponin slightly elevated BNP slightly elevated   IMPRESSION: 1. Low volume film with vascular congestion. 2. Moderate hiatal hernia.   Prior note in December 2023 did show some evidence of diastolic dysfunction grade 1.  10:33 AM family reports some improvement of coughing, wheezing with DuoNeb but upon repeat assessment she still has diffuse wheezing noted bilaterally although her work of breathing is improved.  Will give additional 2 DuoNebs.  We discussed admission to the hospital versus going home.  Given patient's dementia I do report that they would like to see if they could try to get her home but I am concerned about patient's breathing and now her x-ray showing some fluid I do feel like she does have some edema in her legs I will give her a dose of Lasix  to help with fluid overload.  Will see how she does with ambulatory sat and further discussion with family about admission.  11:07 AM patient's oxygen levels go to 93% with ambulation given concerns for new onset CHF in the setting of bronchitis I do feel like with patient's age that it be safer for admission given her increased respiratory rate with audible wheezing.  Her wheezing does seem to be  improved and is no longer audible but after discussion with family she lives with her husband and there would be no one to help get her to the bathroom and to help monitor her breathing and they also agreed with admission given concerns for the above.  Patient does not meet sepsis criteria but will start on some antibiotics in case there is concurrent pneumonia that is being missed on x-ray.  Hospitalist wanted to hold antibiotics therefore these were taken out.  The patient is on the cardiac monitor to evaluate  for evidence of arrhythmia and/or significant heart rate changes.      FINAL CLINICAL IMPRESSION(S) / ED DIAGNOSES   Final diagnoses:  Acute congestive heart failure, unspecified heart failure type (HCC)  Bronchitis     Rx / DC Orders   ED Discharge Orders     None        Note:  This document was prepared using Dragon voice recognition software and may include unintentional dictation errors.   Ernest Ronal BRAVO, MD 02/26/24 1108    Ernest Ronal BRAVO, MD 02/26/24 1114    Ernest Ronal BRAVO, MD 02/26/24 1126    Ernest Ronal BRAVO, MD 02/26/24 [phone]

## 2024-02-26 NOTE — ED Notes (Signed)
 Patient assisted from bed to bedside toilet with 1x assist. Pt assisted back to bed, bed in low locked position, call bell in reach.

## 2024-02-26 NOTE — Plan of Care (Signed)
   Problem: Clinical Measurements: Goal: Respiratory complications will improve Outcome: Progressing

## 2024-02-26 NOTE — Progress Notes (Signed)
 Heart Failure Navigator Progress Note  Assessed for Heart & Vascular TOC clinic readiness.  Does not meet criteria due to current [facility] consult.  Navigator will sign off at this time.  Charmaine Pines, RN, BSN [facility] Heart Failure Navigator Secure Chat Only

## 2024-02-26 NOTE — H&P (Signed)
 History and Physical    Carly Peck FMW:[id number] DOB: 1931-06-24 DOA: 02/26/2024  DOS: the patient was seen and examined on 02/26/2024  PCP: Lenon Layman ORN, MD   Patient coming from: ALF  I have personally briefly reviewed patient's old medical records in [facility]  Chief Complaint: Cough and shortness of breath  HPI: Carly Peck is a pleasant 89 y.o. female with medical history significant for HTN, HLD, anxiety/depression, left bundle branch block, breast cancer s/p radiation and chemotherapy, MGUS, tic [place], history of ischemic stroke without residual in December 2023 on aspirin , herpes zoster, dementia who was brought in from assisted living facility for shortness of breath, wheezing, cough that started yesterday.  Patient daughter was at bedside and she stated that that got worse overnight.  Patient also complains of congestion grabbing her right ear.  Patient stated that she was up all night.  She had a history of pneumonia last year.  She was recently treated with doxycycline  and prednisone  for possible bronchitis on January 16, 2024.  She also had a similar treatment for cough and shortness of breath in December 2024. Patient denies any fever, chills, nausea, vomiting, chest pain, palpitations.  ED Course: Upon arrival to the ED, patient is found to be coughing, EKG shows sinus tachycardia at 100 bpm without any ST elevation, chest x-ray showed congestion no focal pneumonia, virus panel was negative including COVID.  Patient had elevated proBNP at 1613, troponin was 27.  Patient was started on diuretics Lasix  20 mg IV, given nebulization, given a dose of methylprednisolone .  Hospitalist service was consulted for evaluation for admission for possible COPD/CHF exacerbation.  Review of Systems:  ROS  All other systems negative except as noted in the HPI.  Past Medical History:  Diagnosis Date   Anxiety    Arthritis    Breast cancer (HCC) 1997   left breast  cancer   Cancer (HCC)    Environmental and seasonal allergies    GERD (gastroesophageal reflux disease)    History of left breast cancer    Hyperlipidemia    Hypertension    Left bundle branch block (LBBB)    Neuropathy    Personal history of chemotherapy 1997   Left breast   Personal history of radiation therapy 1997   Left breast    Past Surgical History:  Procedure Laterality Date   BACK SURGERY     BREAST SURGERY     CARDIAC CATHETERIZATION     CYSTOSCOPY N/A 03/08/2022   Procedure: Foley placement;  Surgeon: Twylla Glendia BROCKS, MD;  Location: [facility] ORS;  Service: Urology;  Laterality: N/A;   DILATION AND CURETTAGE OF UTERUS     EYE SURGERY Bilateral    Cataract Extraction with IOL   KNEE ARTHROPLASTY Right 09/14/2015   Procedure: COMPUTER ASSISTED TOTAL KNEE ARTHROPLASTY;  Surgeon: Lynwood SHAUNNA Hue, MD;  Location: [facility] ORS;  Service: Orthopedics;  Laterality: Right;   KNEE ARTHROPLASTY Left 05/30/2016   Procedure: COMPUTER ASSISTED TOTAL KNEE ARTHROPLASTY;  Surgeon: Lynwood SHAUNNA Hue, MD;  Location: [facility] ORS;  Service: Orthopedics;  Laterality: Left;   KYPHOPLASTY  2016   Dr. Kathlynn, [facility]   MASTECTOMY Left 1997   chemo rad mastectomy   TONSILLECTOMY       reports that she has never smoked. She has never used smokeless tobacco. She reports current alcohol use of about 1.0 standard drink of alcohol per week. She reports that she does not use drugs.  Allergies  Allergen Reactions  Adhesive [Tape] Other (See Comments)    Red mark, Please use paper tape    Family History  Problem Relation Age of Onset   Breast cancer Neg Hx     Prior to Admission medications   Medication Sig Start Date End Date Taking? Authorizing Provider  aspirin  EC 81 MG tablet Take 1 tablet (81 mg total) by mouth daily. Swallow whole. 03/12/22   Josette Ade, MD  atorvastatin  (LIPITOR) 40 MG tablet Take 1 tablet (40 mg total) by mouth every evening. 03/12/22   Josette Ade, MD  carvedilol   (COREG ) 12.5 MG tablet Take 12.5 mg by mouth 2 (two) times daily with a meal.    [provider]  cetirizine (ZYRTEC) 10 MG tablet Take 10 mg by mouth daily.    [provider]  Cholecalciferol  (VITAMIN D3) 2000 units TABS Take 2,000 Units by mouth daily.    [provider]  Cyanocobalamin  (VITAMIN B-12) 1000 MCG SUBL Place 1 tablet under the tongue every other day.    [provider]  DULoxetine  (CYMBALTA ) 30 MG capsule Take 30 mg by mouth daily. 11/22/21   [provider]  Fe Bisgly-Vit C-Vit B12-FA (GENTLE IRON) 28-60-0.008-0.4 MG CAPS Take by mouth daily.    [provider]  fluticasone  (FLONASE ) 50 MCG/ACT nasal spray Place 2 sprays into both nostrils 2 (two) times daily as needed for rhinitis.    [provider]  fluticasone -salmeterol (ADVAIR) 100-50 MCG/ACT AEPB Inhale 1 puff into the lungs 2 (two) times daily.    [provider]  mupirocin ointment (BACTROBAN) 2 % Apply 1 Application topically 3 (three) times daily. 05/18/23   [provider]    Physical Exam: Vitals:   02/26/24 1130 02/26/24 1200 02/26/24 1230 02/26/24 1245  BP: 116/64 121/64 (!) 121/58   Pulse: 93 97 (!) 101 100  Resp: 20 20 15 17   Temp:      TempSrc:      SpO2: 95% 93% 93% 94%  Weight:      Height:        Physical Exam   Constitutional: Alert, awake, calm, comfortable HEENT: Neck supple Respiratory: Bilateral decreased air entry at the bases, fine basal rales and scattered wheezing. Cardiovascular: Regular rate and rhythm, no murmurs / rubs / gallops.  1+ bilateral pitting edema. 2+ pedal pulses. No carotid bruits.  Abdomen: Soft, no tenderness, Bowel sounds positive.  Musculoskeletal: no clubbing / cyanosis. Good ROM, no contractures. Normal muscle tone.  Skin: no rashes, lesions, ulcers. Neurologic: CN 2-12 grossly intact. Sensation intact, No focal deficit identified Psychiatric: Alert and oriented x 3. Normal mood.     Labs on Admission: I have personally reviewed following labs and imaging studies  CBC: Recent Labs  Lab 02/26/24 0858  WBC 7.2  HGB 10.6*  HCT 32.5*  MCV 86.7  PLT 225   Basic Metabolic Panel: Recent Labs  Lab 02/26/24 0858  NA 131*  K 4.2  CL 96*  CO2 22  GLUCOSE 110*  BUN 14  CREATININE 0.82  CALCIUM  9.9   GFR: Estimated Creatinine Clearance: 47 mL/min (by C-G formula based on SCr of 0.82 mg/dL). Liver Function Tests: No results for input(s): AST, ALT, ALKPHOS, BILITOT, PROT, ALBUMIN in the last 168 hours. No results for input(s): LIPASE, AMYLASE in the last 168 hours. No results for input(s): AMMONIA in the last 168 hours. Coagulation Profile: No results for input(s): INR, PROTIME in the last 168 hours. Cardiac Enzymes: No results for input(s): CKTOTAL, CKMB,  CKMBINDEX, TROPONINI, TROPONINIHS in the last 168 hours. BNP (last 3 results) No results for input(s): BNP in the last 8760 hours. HbA1C: No results for input(s): HGBA1C in the last 72 hours. CBG: No results for input(s): GLUCAP in the last 168 hours. Lipid Profile: No results for input(s): CHOL, HDL, LDLCALC, TRIG, CHOLHDL, LDLDIRECT in the last 72 hours. Thyroid  Function Tests: No results for input(s): TSH, T4TOTAL, FREET4, T3FREE, THYROIDAB in the last 72 hours. Anemia Panel: No results for input(s): VITAMINB12, FOLATE, FERRITIN, TIBC, IRON, RETICCTPCT in the last 72 hours. Urine analysis:    Component Value Date/Time   COLORURINE YELLOW (A) 03/08/2022 1918   APPEARANCEUR CLEAR (A) 03/08/2022 1918   APPEARANCEUR Clear 02/02/2014 2238   LABSPEC 1.029 03/08/2022 1918   LABSPEC 1.017 02/02/2014 2238   PHURINE 5.0 03/08/2022 1918   GLUCOSEU NEGATIVE 03/08/2022 1918   GLUCOSEU Negative 02/02/2014 2238   HGBUR SMALL (A) 03/08/2022 1918   BILIRUBINUR NEGATIVE 03/08/2022 1918   BILIRUBINUR Negative 02/02/2014 2238   KETONESUR  NEGATIVE 03/08/2022 1918   PROTEINUR NEGATIVE 03/08/2022 1918   NITRITE NEGATIVE 03/08/2022 1918   LEUKOCYTESUR SMALL (A) 03/08/2022 1918   LEUKOCYTESUR Trace 02/02/2014 2238    Radiological Exams on Admission: I have personally reviewed images DG Chest 2 View Result Date: 02/26/2024 CLINICAL DATA:  Shortness of breath. EXAM: CHEST - 2 VIEW COMPARISON:  03/17/2023 FINDINGS: Low volume film. The cardio pericardial silhouette is enlarged. Moderate hiatal hernia. There is pulmonary vascular congestion without overt pulmonary edema. Bandlike clustered nodularity overlies the upper chest, seen to be anterior to the patient on the lateral projection. IMPRESSION: 1. Low volume film with vascular congestion. 2. Moderate hiatal hernia. Electronically Signed   By: Camellia Candle M.D.   On: 02/26/2024 10:26    EKG: My personal interpretation of EKG shows: Sinus rhythm at 100 bpm with some nonspecific ST depression    Assessment/Plan Principal Problem:   CHF (congestive heart failure) (HCC) Active Problems:   Benign essential HTN   HLD (hyperlipidemia)   Depression with anxiety   MGUS (monoclonal gammopathy of unknown significance)   History of ischemic stroke   COPD with acute exacerbation (HCC)    Assessment and Plan: 89 year old female with multiple medical problems including but not limited to COPD not on home oxygen only on inhalers, HLD, anxiety/depression, MGUS, history of stroke, recurrent history of bronchitis/pneumonia who was brought in for cough and shortness of breath since yesterday.  She also has bilateral lower extremity swelling.  1.  Cough/shortness of breath/leg edema - Patient does not have formal diagnosis of congestive heart failure but previous echo showed diastolic dysfunction and patient is on Coreg  - Due to cough shortness of breath, orthopnea and leg edema pointing towards acute exacerbation of congestive heart failure. - Patient was given Lasix  in the emergency room  with some improvement. - Will continue Lasix  20 mg IV twice daily, echocardiogram, telemetry, trend troponin - Cardiology consult for assistance with acute congestive heart failure  2 patient has a history of COPD - Could be acute bronchitis even though chest x-ray did not show any pneumonia. - She was given steroid in the emergency room. - I will also give her some steroid, nebulization and oxygen to maintain saturation more than 90% - There is no fever and no leukocytosis, will hold off antibiotics at this point.  3.  Anxiety/depression/dementia - Supportive care - Continue Cymbalta  at home dose 20 mg daily - Will give her home medication once medication reconciliation  is done  4.  MGUS - Follow-up as outpatient  5.  HTN/HLD - Resume her home medications, she takes Coreg  we will continue with that - Resume Lipitor 40 mg  6.  History of stroke without any residual - Continue aspirin /statin  7.  Deconditioning - PT/OT      DVT prophylaxis: Lovenox  Code Status: Full Code Family Communication: Daughter was at bedside Disposition Plan: Back to ALF Consults called: Cardiology Admission status: Observation, Telemetry bed   Nena Rebel, MD [facility] 02/26/2024, 1:20 PM

## 2024-02-26 NOTE — Evaluation (Signed)
 Occupational Therapy Evaluation Patient Details Name: Carly Peck MRN: [id number] DOB: Sep 08, 1931 Today's Date: 02/26/2024   History of Present Illness   89 y.o. female who was brought in from ALF for shortness of breath, wheezing, cough that started yesterday. Current MD assessment: CHF. PMH of HTN, HLD, anxiety/depression, left bundle branch block, breast cancer s/p radiation and chemotherapy, MGUS, tic [place], history of ischemic stroke without residual in December 2023 on aspirin , herpes zoster, dementia     Clinical Impressions Pt was seen for OT evaluation this date. PTA, she resides at [facility] ALF with her spouse. Pt asleep and somewhat lethargic during session. Daughter provided history. She is typically able to perform toileting tasks and ambulate limited community distances with a rollator. She has an aide to assist with dressing daily and bathing 2x/wk. The facility provides her meals and medications and manages her laundry. Pt presents with deficits in strength, balance, and activity tolerance, affecting safe and optimal ADL completion. Pt currently requires MIN A for bed mobility with cues for sequencing/technique and trunkal elevation. Min A for STS, toilet transfer and CGA for ambulation ~40 ft to bathroom and back. UB dressing performed with Min A and clothing management with Max A standing at toilet. She is not far from her baseline and daughter present and would like pt to return to ALF with [facility] therapy services.  Will follow acutely to maximize return to PLOF.      If plan is discharge home, recommend the following:   A little help with walking and/or transfers;A lot of help with bathing/dressing/bathroom     Functional Status Assessment   Patient has had a recent decline in their functional status and demonstrates the ability to make significant improvements in function in a reasonable and predictable amount of time.     Equipment Recommendations   None  recommended by OT     Recommendations for Other Services         Precautions/Restrictions         Mobility Bed Mobility Overal bed mobility: Needs Assistance Bed Mobility: Supine to Sit     Supine to sit: Mod assist, Min assist     General bed mobility comments: cues for sequencing and trunkal elevation    Transfers Overall transfer level: Needs assistance Equipment used: Rollator (4 wheels) Transfers: Sit to/from Stand Sit to Stand: Min assist           General transfer comment: from EOB and toilet during session with cues for grab bar use      Balance Overall balance assessment: Needs assistance Sitting-balance support: Feet unsupported, Single extremity supported Sitting balance-Leahy Scale: Good     Standing balance support: Reliant on assistive device for balance, Bilateral upper extremity supported Standing balance-Leahy Scale: Fair Standing balance comment: RW and +1 external support                           ADL either performed or assessed with clinical judgement   ADL Overall ADL's : Needs assistance/impaired     Grooming: Wash/dry hands;Contact guard assist;Standing;Cueing for sequencing           Upper Body Dressing : Minimal assistance;Moderate assistance;Sitting Upper Body Dressing Details (indicate cue type and reason): on EOB     Toilet Transfer: Contact guard assist;Regular Toilet;Rolling walker (2 wheels)   Toileting- Clothing Manipulation and Hygiene: Contact guard assist;Sitting/lateral lean       Functional mobility during ADLs: Contact guard  assist;Rollator (4 wheels)       Vision         Perception         Praxis         Pertinent Vitals/Pain Pain Assessment Pain Assessment: No/denies pain     Extremity/Trunk Assessment Upper Extremity Assessment Upper Extremity Assessment: Overall WFL for tasks assessed;Generalized weakness   Lower Extremity Assessment Lower Extremity Assessment:  Generalized weakness       Communication     Cognition Arousal: Lethargic, Alert Behavior During Therapy: WFL for tasks assessed/performed, Flat affect Cognition: History of cognitive impairments, No family/caregiver present to determine baseline                               Following commands: Impaired Following commands impaired: Follows one step commands with increased time, Follows one step commands inconsistently     Cueing  General Comments   Cueing Techniques: Verbal cues;Gestural cues;Tactile cues      Exercises Other Exercises Other Exercises: Edu on role of OT in acute setting and DC recommendations with daughter.   Shoulder Instructions      Home Living Family/patient expects to be discharged to:: Assisted living [facility])                             Home Equipment: Rollator (4 wheels)   Additional Comments: live with her spouse who is almost 50 yo      Prior Functioning/Environment Prior Level of Function : Needs assist  Cognitive Assist : ADLs (cognitive)     Physical Assist : ADLs (physical)   ADLs (physical): Bathing;Dressing;IADLs Mobility Comments: denies falls, uses rollator household distances and limited community distances slowly with labored breathing; uses elevator to get to ground floor ADLs Comments: aide comes 2x/wk to assist with bathing and comes in to assist with dressing daily; can perform toileting on her own, increased incont-wearing depends now    OT Problem List: Decreased strength;Decreased activity tolerance   OT Treatment/Interventions: Self-care/ADL training;Therapeutic exercise;Therapeutic activities;DME and/or AE instruction;Patient/family education;Balance training;Energy conservation      OT Goals(Current goals can be found in the care plan section)   Acute Rehab OT Goals Patient Stated Goal: go home OT Goal Formulation: With family Time For Goal Achievement: 03/11/24 Potential to  Achieve Goals: Good ADL Goals Pt Will Perform Grooming: with supervision;standing Pt Will Transfer to Toilet: with supervision;with modified independence;ambulating Pt Will Perform Toileting - Clothing Manipulation and hygiene: with supervision;with modified independence;sitting/lateral leans;sit to/from stand   OT Frequency:  Min 2X/week    Co-evaluation              AM-PAC OT 6 Clicks Daily Activity     Outcome Measure Help from another person eating meals?: A Little Help from another person taking care of personal grooming?: A Little Help from another person toileting, which includes using toliet, bedpan, or urinal?: A Little Help from another person bathing (including washing, rinsing, drying)?: A Lot Help from another person to put on and taking off regular upper body clothing?: A Little Help from another person to put on and taking off regular lower body clothing?: A Lot 6 Click Score: 16   End of Session Equipment Utilized During Treatment: Rollator (4 wheels) Nurse Communication: Mobility status  Activity Tolerance: Patient tolerated treatment well Patient left: in bed;with call bell/phone within reach;with family/visitor present  OT Visit Diagnosis: Other  abnormalities of gait and mobility (R26.89);Muscle weakness (generalized) (M62.81)                Time: 8466-8396 OT Time Calculation (min): 30 min Charges:  OT General Charges $OT Visit: 1 Visit OT Evaluation $OT Eval Moderate Complexity: 1 Mod OT Treatments $Self Care/Home Management : 8-22 mins Polo Mcmartin Chrismon, OTR/L 02/26/24, 5:18 PM  Bani Gianfrancesco E Chrismon 02/26/2024, 5:15 PM

## 2024-02-26 NOTE — ED Notes (Signed)
 Pt assisted to bedside toilet at this time with minimal assistance. Per pt and daughter request, pt sat on side of bed for comfort. Daughter at bedside with pt. Daughter educated to use call light if pt wants to get back in bed or if daughter leaves bedside. Call light within reach and pt placed back on monitoring equipment.

## 2024-02-26 NOTE — Consult Note (Signed)
 [facility] CLINIC CARDIOLOGY CONSULT NOTE       Patient ID: Carly Peck MRN: [id number] DOB/AGE: 88-Jul-1933 88 y.o.  Admit date: 02/26/2024 Referring Physician Dr. Nena Rebel Primary Physician Lenon Layman ORN, MD  Primary Cardiologist None (saw Hester 2017) Reason for Consultation CHF  HPI: Carly Peck is a 89 y.o. female  with a past medical history of HTN, HLD, anxiety/depression, left bundle branch block, breast cancer s/p radiation and chemotherapy, MGUS, tic [place], history of ischemic stroke without residual in December 2023 on aspirin , herpes zoster, dementia who presented to the ED on 02/26/2024 for shortness of breath and cough. Cardiology was consulted for further evaluation.   Patient presented to the ED for evaluation of SOB and cough. Workup in the ED notable for creatinine 0.82, potassium 4.2, hemoglobin 10.6, WBC 7.2. Troponins 27, BNP 1613. EKG in the ED NSR rate 100 bpm. CXR with vascular congestion. Started on IV lasix  in the ED.   At the time of my evaluation this afternoon, she is resting in ED stretcher with daughter at bedside. Daughter provides majority of the history as patient has dementia. States that she has had gradually worsening SOB and functional decline over the last year. Endorses about 20 lb weight gain over the last year as well. States that her mother has not complained of CP aside from 1 episode which was associated with a coughing spell. No complaints of palpitations. Over the last few days has had episode of coughing and congestion which were new.   Review of systems complete and found to be negative unless listed above    Past Medical History:  Diagnosis Date   Anxiety    Arthritis    Breast cancer (HCC) 1997   left breast cancer   Cancer (HCC)    Environmental and seasonal allergies    GERD (gastroesophageal reflux disease)    History of left breast cancer    Hyperlipidemia    Hypertension    Left bundle branch block (LBBB)     Neuropathy    Personal history of chemotherapy 1997   Left breast   Personal history of radiation therapy 1997   Left breast    Past Surgical History:  Procedure Laterality Date   BACK SURGERY     BREAST SURGERY     CARDIAC CATHETERIZATION     CYSTOSCOPY N/A 03/08/2022   Procedure: Foley placement;  Surgeon: Twylla Glendia BROCKS, MD;  Location: [facility] ORS;  Service: Urology;  Laterality: N/A;   DILATION AND CURETTAGE OF UTERUS     EYE SURGERY Bilateral    Cataract Extraction with IOL   KNEE ARTHROPLASTY Right 09/14/2015   Procedure: COMPUTER ASSISTED TOTAL KNEE ARTHROPLASTY;  Surgeon: Lynwood SHAUNNA Hue, MD;  Location: [facility] ORS;  Service: Orthopedics;  Laterality: Right;   KNEE ARTHROPLASTY Left 05/30/2016   Procedure: COMPUTER ASSISTED TOTAL KNEE ARTHROPLASTY;  Surgeon: Lynwood SHAUNNA Hue, MD;  Location: [facility] ORS;  Service: Orthopedics;  Laterality: Left;   KYPHOPLASTY  2016   Dr. Kathlynn, [facility]   MASTECTOMY Left 1997   chemo rad mastectomy   TONSILLECTOMY      (Not in a hospital admission)  Social History   Socioeconomic History   Marital status: Married    Spouse name: Not on file   Number of children: Not on file   Years of education: Not on file   Highest education level: Not on file  Occupational History   Not on file  Tobacco Use   Smoking status:  Never   Smokeless tobacco: Never  Substance and Sexual Activity   Alcohol use: Yes    Alcohol/week: 1.0 standard drink of alcohol    Types: 1 Glasses of wine per week    Comment: occ.   Drug use: No   Sexual activity: Not on file  Other Topics Concern   Not on file  Social History Narrative   Not on file   Social Drivers of Health   Financial Resource Strain: Low Risk  (02/09/2023)   Received from [facility]   Overall Financial Resource Strain (CARDIA)    Difficulty of Paying Living Expenses: Not hard at all  Food Insecurity: No Food Insecurity (02/09/2023)   Received from [facility]    Hunger Vital Sign    Within the past 12 months, you worried that your food would run out before you got the money to buy more.: Never true    Within the past 12 months, the food you bought just didn't last and you didn't have money to get more.: Never true  Transportation Needs: No Transportation Needs (02/09/2023)   Received from [facility] - Transportation    In the past 12 months, has lack of transportation kept you from medical appointments or from getting medications?: No    Lack of Transportation (Non-Medical): No  Physical Activity: Not on file  Stress: Not on file  Social Connections: Not on file  Intimate Partner Violence: Not At Risk (03/11/2022)   Humiliation, Afraid, Rape, and Kick questionnaire    Fear of Current or Ex-Partner: No    Emotionally Abused: No    Physically Abused: No    Sexually Abused: No    Family History  Problem Relation Age of Onset   Breast cancer Neg Hx      Vitals:   02/26/24 1130 02/26/24 1200 02/26/24 1230 02/26/24 1245  BP: 116/64 121/64 (!) 121/58   Pulse: 93 97 (!) 101 100  Resp: 20 20 15 17   Temp:      TempSrc:      SpO2: 95% 93% 93% 94%  Weight:      Height:        PHYSICAL EXAM General: Well appearing elderly female, well nourished, in no acute distress. HEENT: Normocephalic and atraumatic. Neck: No JVD.  Lungs: Normal respiratory effort on room air. Bibasilar crackles. Heart: HRRR. Normal S1 and S2 without gallops or murmurs.  Abdomen: Non-distended appearing.  Msk: Normal strength and tone for age. Extremities: Warm and well perfused. No clubbing, cyanosis. 1-2+ edema.  Neuro: Alert and oriented X 3. Psych: Answers questions appropriately.   Labs: Basic Metabolic Panel: Recent Labs    02/26/24 0858  NA 131*  K 4.2  CL 96*  CO2 22  GLUCOSE 110*  BUN 14  CREATININE 0.82  CALCIUM  9.9   Liver Function Tests: No results for input(s): AST, ALT, ALKPHOS, BILITOT, PROT, ALBUMIN  in the last 72 hours. No results for input(s): LIPASE, AMYLASE in the last 72 hours. CBC: Recent Labs    02/26/24 0858  WBC 7.2  HGB 10.6*  HCT 32.5*  MCV 86.7  PLT 225   Cardiac Enzymes: No results for input(s): CKTOTAL, CKMB, CKMBINDEX, TROPONINIHS in the last 72 hours. BNP: No results for input(s): BNP in the last 72 hours. D-Dimer: No results for input(s): DDIMER in the last 72 hours. Hemoglobin A1C: No results for input(s): HGBA1C in the last 72 hours. Fasting Lipid Panel: No  results for input(s): CHOL, HDL, LDLCALC, TRIG, CHOLHDL, LDLDIRECT in the last 72 hours. Thyroid  Function Tests: No results for input(s): TSH, T4TOTAL, T3FREE, THYROIDAB in the last 72 hours.  Invalid input(s): FREET3 Anemia Panel: No results for input(s): VITAMINB12, FOLATE, FERRITIN, TIBC, IRON, RETICCTPCT in the last 72 hours.   Radiology: DG Chest 2 View Result Date: 02/26/2024 CLINICAL DATA:  Shortness of breath. EXAM: CHEST - 2 VIEW COMPARISON:  03/17/2023 FINDINGS: Low volume film. The cardio pericardial silhouette is enlarged. Moderate hiatal hernia. There is pulmonary vascular congestion without overt pulmonary edema. Bandlike clustered nodularity overlies the upper chest, seen to be anterior to the patient on the lateral projection. IMPRESSION: 1. Low volume film with vascular congestion. 2. Moderate hiatal hernia. Electronically Signed   By: Camellia Candle M.D.   On: 02/26/2024 10:26    ECHO ordered  TELEMETRY (personally reviewed): sinus rhythm rate 90 s  EKG (personally reviewed): NSR rate 100 bpm  Data reviewed by me 02/26/2024: last 24h vitals tele labs imaging I/O ED provider note, admission H&P  Principal Problem:   CHF (congestive heart failure) (HCC) Active Problems:   HLD (hyperlipidemia)   Benign essential HTN   MGUS (monoclonal gammopathy of unknown significance)   Depression with anxiety   History of ischemic stroke    COPD with acute exacerbation (HCC)    ASSESSMENT AND PLAN:  Carly Peck is a 89 y.o. female  with a past medical history of HTN, HLD, anxiety/depression, left bundle branch block, breast cancer s/p radiation and chemotherapy, MGUS, tic [place], history of ischemic stroke without residual in December 2023 on aspirin , herpes zoster, dementia who presented to the ED on 02/26/2024 for shortness of breath and cough. Cardiology was consulted for further evaluation.   # Shortness of breath # Elevated BNP # Chronic LBBB # Hx CVA Patient presented with SOB, found to have elevated BNP at 1400 and vascular congestion on CXR. Started on IV lasix  in the ED.  -Echo ordered for evaluation, further recommendations pending these results.  -Continue IV lasix  20 mg twice daily. -Continue home carvedilol  12.5 mg twice daily. Consider addition of spironolactone tomorrow. Likely a poor candidate for SGLT2i.  -Continue atorvastatin  40 mg daily and aspirin  81 mg daily.  -Minimal troponin most consistent with demand/supply mismatch and not ACS.   This patient's plan of care was discussed and created with Dr. Ammon and he is in agreement.  Signed: Danita Bloch, PA-C  02/26/2024, 1:25 PM [facility] Cardiology

## 2024-02-26 NOTE — ED Triage Notes (Signed)
 Pt comes in via pov with SOB, cough and wheezing that started yesterday, but got worse over night. Pt with no history of respiratory issues. Pt did have pneumonia this time last year.  Pt congested on presentation, and grabbing at her right ear. Pt with a history of dementia.

## 2024-02-27 ENCOUNTER — Observation Stay: Admit: 2024-02-27

## 2024-02-27 ENCOUNTER — Observation Stay: Admit: 2024-02-27 | Discharge: 2024-02-27 | Disposition: A | Attending: Student

## 2024-02-27 DIAGNOSIS — Z853 Personal history of malignant neoplasm of breast: Secondary | ICD-10-CM | POA: Diagnosis not present

## 2024-02-27 DIAGNOSIS — G309 Alzheimer's disease, unspecified: Secondary | ICD-10-CM | POA: Diagnosis present

## 2024-02-27 DIAGNOSIS — I5031 Acute diastolic (congestive) heart failure: Secondary | ICD-10-CM | POA: Diagnosis present

## 2024-02-27 DIAGNOSIS — I509 Heart failure, unspecified: Secondary | ICD-10-CM | POA: Diagnosis present

## 2024-02-27 DIAGNOSIS — Z9012 Acquired absence of left breast and nipple: Secondary | ICD-10-CM | POA: Diagnosis not present

## 2024-02-27 DIAGNOSIS — Z8673 Personal history of transient ischemic attack (TIA), and cerebral infarction without residual deficits: Secondary | ICD-10-CM | POA: Diagnosis not present

## 2024-02-27 DIAGNOSIS — I110 Hypertensive heart disease with heart failure: Secondary | ICD-10-CM | POA: Diagnosis present

## 2024-02-27 DIAGNOSIS — F0284 Dementia in other diseases classified elsewhere, unspecified severity, with anxiety: Secondary | ICD-10-CM | POA: Diagnosis present

## 2024-02-27 DIAGNOSIS — Z9221 Personal history of antineoplastic chemotherapy: Secondary | ICD-10-CM | POA: Diagnosis not present

## 2024-02-27 DIAGNOSIS — Z7951 Long term (current) use of inhaled steroids: Secondary | ICD-10-CM | POA: Diagnosis not present

## 2024-02-27 DIAGNOSIS — Z923 Personal history of irradiation: Secondary | ICD-10-CM | POA: Diagnosis not present

## 2024-02-27 DIAGNOSIS — K449 Diaphragmatic hernia without obstruction or gangrene: Secondary | ICD-10-CM | POA: Diagnosis present

## 2024-02-27 DIAGNOSIS — F0283 Dementia in other diseases classified elsewhere, unspecified severity, with mood disturbance: Secondary | ICD-10-CM | POA: Diagnosis present

## 2024-02-27 DIAGNOSIS — Z1152 Encounter for screening for COVID-19: Secondary | ICD-10-CM | POA: Diagnosis not present

## 2024-02-27 DIAGNOSIS — R5381 Other malaise: Secondary | ICD-10-CM | POA: Diagnosis present

## 2024-02-27 DIAGNOSIS — J441 Chronic obstructive pulmonary disease with (acute) exacerbation: Secondary | ICD-10-CM | POA: Diagnosis present

## 2024-02-27 DIAGNOSIS — D472 Monoclonal gammopathy: Secondary | ICD-10-CM | POA: Diagnosis present

## 2024-02-27 DIAGNOSIS — Z91048 Other nonmedicinal substance allergy status: Secondary | ICD-10-CM | POA: Diagnosis not present

## 2024-02-27 DIAGNOSIS — E785 Hyperlipidemia, unspecified: Secondary | ICD-10-CM | POA: Diagnosis present

## 2024-02-27 DIAGNOSIS — I447 Left bundle-branch block, unspecified: Secondary | ICD-10-CM | POA: Diagnosis present

## 2024-02-27 DIAGNOSIS — Z96653 Presence of artificial knee joint, bilateral: Secondary | ICD-10-CM | POA: Diagnosis present

## 2024-02-27 DIAGNOSIS — Z7982 Long term (current) use of aspirin: Secondary | ICD-10-CM | POA: Diagnosis not present

## 2024-02-27 DIAGNOSIS — Z79899 Other long term (current) drug therapy: Secondary | ICD-10-CM | POA: Diagnosis not present

## 2024-02-27 LAB — CBC
HCT: 31 % — ABNORMAL LOW (ref 36.0–46.0)
Hemoglobin: 10.1 g/dL — ABNORMAL LOW (ref 12.0–15.0)
MCH: 28.3 pg (ref 26.0–34.0)
MCHC: 32.6 g/dL (ref 30.0–36.0)
MCV: 86.8 fL (ref 80.0–100.0)
Platelets: 228 K/uL (ref 150–400)
RBC: 3.57 MIL/uL — ABNORMAL LOW (ref 3.87–5.11)
RDW: 14.9 % (ref 11.5–15.5)
WBC: 6.8 K/uL (ref 4.0–10.5)
nRBC: 0 % (ref 0.0–0.2)

## 2024-02-27 LAB — ECHOCARDIOGRAM COMPLETE
AR max vel: 1.87 cm2
AV Area VTI: 1.89 cm2
AV Area mean vel: 2.01 cm2
AV Mean grad: 4.7 mmHg
AV Peak grad: 8.1 mmHg
Ao pk vel: 1.43 m/s
Area-P 1/2: 4.46 cm2
Height: 65 in
MV VTI: 2.32 cm2
S' Lateral: 2.3 cm
Weight: 2807.78 [oz_av]

## 2024-02-27 LAB — COMPREHENSIVE METABOLIC PANEL WITH GFR
ALT: 18 U/L (ref 0–44)
AST: 23 U/L (ref 15–41)
Albumin: 3.7 g/dL (ref 3.5–5.0)
Alkaline Phosphatase: 105 U/L (ref 38–126)
Anion gap: 15 (ref 5–15)
BUN: 22 mg/dL (ref 8–23)
CO2: 23 mmol/L (ref 22–32)
Calcium: 9.4 mg/dL (ref 8.9–10.3)
Chloride: 95 mmol/L — ABNORMAL LOW (ref 98–111)
Creatinine, Ser: 0.81 mg/dL (ref 0.44–1.00)
GFR, Estimated: >60 mL/min (ref >60)
Glucose, Bld: 143 mg/dL — ABNORMAL HIGH (ref 70–99)
Potassium: 3.3 mmol/L — ABNORMAL LOW (ref 3.5–5.1)
Sodium: 133 mmol/L — ABNORMAL LOW (ref 135–145)
Total Bilirubin: <0.2 mg/dL (ref 0.0–1.2)
Total Protein: 8.1 g/dL (ref 6.5–8.1)

## 2024-02-27 LAB — PROTIME-INR
INR: 1.3 — ABNORMAL HIGH (ref 0.8–1.2)
Prothrombin Time: 16.6 s — ABNORMAL HIGH (ref 11.4–15.2)

## 2024-02-27 MED ORDER — HALOPERIDOL LACTATE 5 MG/ML IJ SOLN
1.0000 mg | Freq: Four times a day (QID) | INTRAMUSCULAR | Status: DC | PRN
  Administered 2024-02-27: 1 mg via INTRAVENOUS
  Filled 2024-02-27: qty 1

## 2024-02-27 MED ORDER — ADULT MULTIVITAMIN W/MINERALS CH
1.0000 | ORAL_TABLET | Freq: Every day | ORAL | Status: DC
  Administered 2024-02-28 – 2024-02-29 (×2): 1 via ORAL
  Filled 2024-02-27 (×2): qty 1

## 2024-02-27 MED ORDER — PREDNISONE 20 MG PO TABS
40.0000 mg | ORAL_TABLET | Freq: Every day | ORAL | Status: DC
  Administered 2024-02-28: 40 mg via ORAL
  Filled 2024-02-27: qty 2

## 2024-02-27 MED ORDER — POTASSIUM CHLORIDE 20 MEQ PO PACK
40.0000 meq | PACK | Freq: Once | ORAL | Status: AC
  Administered 2024-02-27: 40 meq via ORAL
  Filled 2024-02-27: qty 2

## 2024-02-27 MED ORDER — BOOST / RESOURCE BREEZE PO LIQD CUSTOM
1.0000 | Freq: Three times a day (TID) | ORAL | Status: DC
  Administered 2024-02-27 – 2024-02-29 (×2): 1 via ORAL

## 2024-02-27 NOTE — Progress Notes (Signed)
 Occupational Therapy Treatment Patient Details Name: Carly Peck MRN: [id number] DOB: Mar 26, 1931 Today's Date: 02/27/2024   History of present illness Pt is a 89 y.o. female with medical history significant for HTN, HLD, anxiety/depression, left bundle branch block, breast cancer s/p radiation and chemotherapy, MGUS, tic [place], history of ischemic stroke without residual in December 2023 on aspirin , herpes zoster, dementia. MD assessment includes: shortness of breath, wheezing, cough, CHF   OT comments  Pt is supine in bed on arrival. Pleasantly confused from baseline dementia and agreeable to OT session. She denies pain. Pt performed bed mobility with supervision and increased time. Performed transfers and mobility during session using rollator with CGA and cues for technique and hand placement. Toileting hygiene performed with CGA seated on toilet and Mod A for clothing management and pull-up change/LB dressing tasks. Min/mod A for UB dressing to change out gowns and donn housecoat. Pt performed standing hand hygiene with CGA prior to ambulating to recliner. Pt left with all needs in place and will cont to require skilled acute OT services to maximize her safety and IND to return to PLOF.       If plan is discharge home, recommend the following:  A little help with walking and/or transfers;A lot of help with bathing/dressing/bathroom   Equipment Recommendations  None recommended by OT    Recommendations for Other Services      Precautions / Restrictions Precautions Precautions: Fall Recall of Precautions/Restrictions: Impaired Restrictions Weight Bearing Restrictions Per Provider Order: No       Mobility Bed Mobility Overal bed mobility: Needs Assistance Bed Mobility: Supine to Sit     Supine to sit: Supervision, HOB elevated, Used rails     General bed mobility comments: no physical assist required, increased time/effort    Transfers Overall transfer level: Needs  assistance Equipment used: Rollator (4 wheels) Transfers: Sit to/from Stand Sit to Stand: Contact guard assist           General transfer comment: cues for hand placement to stand from EOB and toilet, but no physical assist required     Balance Overall balance assessment: Needs assistance Sitting-balance support: Feet unsupported Sitting balance-Leahy Scale: Good     Standing balance support: Reliant on assistive device for balance, Bilateral upper extremity supported Standing balance-Leahy Scale: Fair Standing balance comment: UE use on rollator, able to release for UB dressing and hand hygiene with CGA                           ADL either performed or assessed with clinical judgement   ADL Overall ADL's : Needs assistance/impaired     Grooming: Wash/dry hands;Contact guard assist;Standing           Upper Body Dressing : Minimal assistance;Moderate assistance;Sitting Upper Body Dressing Details (indicate cue type and reason): seated on toilet to change out of gown and don personal gown and housecoat Lower Body Dressing: Moderate assistance;Sit to/from stand Lower Body Dressing Details (indicate cue type and reason): change out pullups Toilet Transfer: Contact guard assist;Regular Toilet;Rolling walker (2 wheels);Grab bars   Toileting- Clothing Manipulation and Hygiene: Contact guard assist;Sitting/lateral lean Toileting - Clothing Manipulation Details (indicate cue type and reason): after cont urination on toilet     Functional mobility during ADLs: Contact guard assist;Rollator (4 wheels)      Extremity/Trunk Assessment              Vision       Perception  Praxis     Communication Communication Communication: Impaired Factors Affecting Communication: Hearing impaired   Cognition Arousal: Alert Behavior During Therapy: WFL for tasks assessed/performed, Flat affect Cognition: History of cognitive impairments                                Following commands: Impaired Following commands impaired: Follows one step commands with increased time, Follows one step commands inconsistently      Cueing   Cueing Techniques: Verbal cues, Gestural cues, Tactile cues  Exercises      Shoulder Instructions       General Comments mild SOB with ambulation, sp02 stable at 93% on RA and HR stable    Pertinent Vitals/ Pain       Pain Assessment Pain Assessment: No/denies pain Pain Intervention(s): Monitored during session  Home Living                                          Prior Functioning/Environment              Frequency  Min 2X/week        Progress Toward Goals  OT Goals(current goals can now be found in the care plan section)  Progress towards OT goals: Progressing toward goals  Acute Rehab OT Goals Patient Stated Goal: go home OT Goal Formulation: With family Time For Goal Achievement: 03/11/24 Potential to Achieve Goals: Good  Plan      Co-evaluation                 AM-PAC OT 6 Clicks Daily Activity     Outcome Measure   Help from another person eating meals?: A Little Help from another person taking care of personal grooming?: A Little Help from another person toileting, which includes using toliet, bedpan, or urinal?: A Little Help from another person bathing (including washing, rinsing, drying)?: A Lot Help from another person to put on and taking off regular upper body clothing?: A Little Help from another person to put on and taking off regular lower body clothing?: A Lot 6 Click Score: 16    End of Session Equipment Utilized During Treatment: Rollator (4 wheels)  OT Visit Diagnosis: Other abnormalities of gait and mobility (R26.89);Muscle weakness (generalized) (M62.81)   Activity Tolerance Patient tolerated treatment well   Patient Left with call bell/phone within reach;with family/visitor present;in chair   Nurse Communication Mobility  status        Time: 1636-1700 OT Time Calculation (min): 24 min  Charges: OT General Charges $OT Visit: 1 Visit OT Treatments $Self Care/Home Management : 23-37 mins  [name], OTR/L  02/27/24, 5:19 PM   [name] 02/27/2024, 5:17 PM

## 2024-02-27 NOTE — Progress Notes (Signed)
*  PRELIMINARY RESULTS* Echocardiogram 2D Echocardiogram has been performed.  Carly Peck 02/27/2024, 1:45 PM

## 2024-02-27 NOTE — Progress Notes (Signed)
  PROGRESS NOTE    Carly Peck  FMW:[id number] DOB: 1931/06/06 DOA: 02/26/2024 PCP: Carly Peck ORN, MD  [id number]  LOS: 0 days   Brief hospital course:   Assessment & Plan: Carly Peck is a pleasant 89 y.o. female with medical history significant for HTN, breast cancer s/p radiation and chemotherapy, MGUS, tic douloureux, history of ischemic stroke without residual in December 2023, herpes zoster, dementia who was brought in from assisted living facility for shortness of breath, wheezing, cough that started the day prior to presentation.   1.  Cough/shortness of breath/leg edema - Patient does not have formal diagnosis of congestive heart failure  --elevated BNP at 1400 and vascular congestion on CXR. Started on IV lasix  in the ED.  --cardio consulted --cont IV lasix  20 mg BID --cont home coreg    history of COPD - Could be acute bronchitis even though chest x-ray did not show any pneumonia. - She was given high-dose IV solumedrol in emergency room. --transition to prednisone  40 mg daily   3.  dementia - Supportive care  Anxiety/depression --cont Cymbalta    4.  MGUS - Follow-up as outpatient   5.  HTN --cont coreg  and IV lasix   HLD --cont statin   6.  History of stroke  - Continue aspirin /statin   7.  Deconditioning - PT/OT   DVT prophylaxis: Lovenox  SQ Code Status: Full code  Family Communication:  Level of care: Telemetry Dispo:   The patient is from: ALF Anticipated d/c is to: SNF rehab Anticipated d/c date is: 1-2 days   Subjective and Interval History:  Pt with no specific complaints.   Objective: Vitals:   02/27/24 0835 02/27/24 1145 02/27/24 1541 02/27/24 1947  BP: 126/62 121/73 115/62 125/63  Pulse: 90 83 86 80  Resp: 17 16 16 20   Temp: 97.8 F (36.6 C) 97.8 F (36.6 C) 98 F (36.7 C) 98 F (36.7 C)  TempSrc:    Oral  SpO2: 95% 95% 92% 97%  Weight:      Height:        Intake/Output Summary (Last 24 hours) at 02/27/2024  2040 Last data filed at 02/27/2024 0955 Gross per 24 hour  Intake 480 ml  Output 200 ml  Net 280 ml   Filed Weights   02/26/24 0839 02/27/24 0500 02/27/24 0700  Weight: 77.8 kg 76.9 kg 79.6 kg    Examination:   Constitutional: NAD, alert HEENT: conjunctivae and lids normal, EOMI CV: No cyanosis.   RESP: normal respiratory effort, no wheezes, on RA Neuro: II - XII grossly intact.     Data Reviewed: I have personally reviewed labs and imaging studies  Time spent: 50 minutes  Carly Haber, MD [facility] If 7PM-7AM, please contact night-coverage 02/27/2024, 8:40 PM

## 2024-02-27 NOTE — Progress Notes (Signed)
 [facility] CLINIC CARDIOLOGY PROGRESS NOTE       Patient ID: Carly Peck MRN: [id number] DOB/AGE: Dec 27, 1931 89 y.o.  Admit date: 02/26/2024 Referring Physician Dr. Nena Rebel Primary Physician Lenon Layman ORN, MD  Primary Cardiologist None (saw Hester 2017) Reason for Consultation CHF  HPI: Carly Peck is a 89 y.o. female  with a past medical history of HTN, HLD, anxiety/depression, left bundle branch block, breast cancer s/p radiation and chemotherapy, MGUS, tic douloureux, history of ischemic stroke without residual in December 2023 on aspirin , herpes zoster, dementia who presented to the ED on 02/26/2024 for shortness of breath and cough. Cardiology was consulted for further evaluation.   Interval history: -Patient seen and examined this AM, resting in hospital bed. Daughter reports she thinks breathing and cough have improved.  -BP and HR stable. Tolerating diuresis with good UOP and stable renal function.  -Echo to be done today.  Review of systems complete and found to be negative unless listed above    Past Medical History:  Diagnosis Date   Anxiety    Arthritis    Breast cancer (HCC) 1997   left breast cancer   Cancer (HCC)    Environmental and seasonal allergies    GERD (gastroesophageal reflux disease)    History of left breast cancer    Hyperlipidemia    Hypertension    Left bundle branch block (LBBB)    Neuropathy    Personal history of chemotherapy 1997   Left breast   Personal history of radiation therapy 1997   Left breast    Past Surgical History:  Procedure Laterality Date   BACK SURGERY     BREAST SURGERY     CARDIAC CATHETERIZATION     CYSTOSCOPY N/A 03/08/2022   Procedure: Foley placement;  Surgeon: Twylla Glendia BROCKS, MD;  Location: [facility] ORS;  Service: Urology;  Laterality: N/A;   DILATION AND CURETTAGE OF UTERUS     EYE SURGERY Bilateral    Cataract Extraction with IOL   KNEE ARTHROPLASTY Right 09/14/2015   Procedure: COMPUTER  ASSISTED TOTAL KNEE ARTHROPLASTY;  Surgeon: Lynwood SHAUNNA Hue, MD;  Location: [facility] ORS;  Service: Orthopedics;  Laterality: Right;   KNEE ARTHROPLASTY Left 05/30/2016   Procedure: COMPUTER ASSISTED TOTAL KNEE ARTHROPLASTY;  Surgeon: Lynwood SHAUNNA Hue, MD;  Location: [facility] ORS;  Service: Orthopedics;  Laterality: Left;   KYPHOPLASTY  2016   Dr. Kathlynn, [facility]   MASTECTOMY Left 1997   chemo rad mastectomy   TONSILLECTOMY      Medications Prior to Admission  Medication Sig Dispense Refill Last Dose/Taking   aspirin  EC 81 MG tablet Take 1 tablet (81 mg total) by mouth daily. Swallow whole. 30 tablet 0 02/25/2024   atorvastatin  (LIPITOR) 40 MG tablet Take 1 tablet (40 mg total) by mouth every evening. 30 tablet 0 02/25/2024   carvedilol  (COREG ) 12.5 MG tablet Take 12.5 mg by mouth 2 (two) times daily with a meal.   02/25/2024   cetirizine (ZYRTEC) 10 MG tablet Take 10 mg by mouth daily.   02/25/2024   Cholecalciferol  (VITAMIN D3) 2000 units TABS Take 2,000 Units by mouth daily.   02/25/2024   Cyanocobalamin  (VITAMIN B-12) 1000 MCG SUBL Place 1 tablet under the tongue every other day.   Past Week   DULoxetine  (CYMBALTA ) 30 MG capsule Take 30 mg by mouth daily.   02/25/2024   Fe Bisgly-Vit C-Vit B12-FA (GENTLE IRON) 28-60-0.008-0.4 MG CAPS Take by mouth daily.   02/25/2024   fluticasone  (FLONASE )  50 MCG/ACT nasal spray Place 2 sprays into both nostrils 2 (two) times daily as needed for rhinitis.   02/25/2024   fluticasone -salmeterol (ADVAIR) 100-50 MCG/ACT AEPB Inhale 1 puff into the lungs 2 (two) times daily.   02/25/2024   memantine (NAMENDA) 5 MG tablet Take 5 mg by mouth 2 (two) times daily.   02/25/2024   Multiple Vitamins-Minerals (PRESERVISION AREDS 2) CAPS Take 1 capsule by mouth 2 (two) times daily.   02/25/2024   mupirocin ointment (BACTROBAN) 2 % Apply 1 Application topically 3 (three) times daily.   Past Week   pantoprazole  (PROTONIX ) 40 MG tablet Take 40 mg by mouth daily.   02/25/2024   potassium chloride   (KLOR-CON  M) 10 MEQ tablet Take 10 mEq by mouth daily.   02/25/2024   pregabalin (LYRICA) 25 MG capsule Take 25 mg by mouth 2 (two) times daily.   02/25/2024   senna-docusate (SENOKOT-S) 8.6-50 MG tablet Take 3 tablets by mouth at bedtime.   02/25/2024   Social History   Socioeconomic History   Marital status: Married    Spouse name: Not on file   Number of children: Not on file   Years of education: Not on file   Highest education level: Not on file  Occupational History   Not on file  Tobacco Use   Smoking status: Never   Smokeless tobacco: Never  Substance and Sexual Activity   Alcohol use: Yes    Alcohol/week: 1.0 standard drink of alcohol    Types: 1 Glasses of wine per week    Comment: occ.   Drug use: No   Sexual activity: Not on file  Other Topics Concern   Not on file  Social History Narrative   Not on file   Social Drivers of Health   Financial Resource Strain: Low Risk  (02/09/2023)   Received from [facility]   Overall Financial Resource Strain (CARDIA)    Difficulty of Paying Living Expenses: Not hard at all  Food Insecurity: No Food Insecurity (02/09/2023)   Received from [facility]   Hunger Vital Sign    Within the past 12 months, you worried that your food would run out before you got the money to buy more.: Never true    Within the past 12 months, the food you bought just didn't last and you didn't have money to get more.: Never true  Transportation Needs: No Transportation Needs (02/09/2023)   Received from [facility] - Transportation    In the past 12 months, has lack of transportation kept you from medical appointments or from getting medications?: No    Lack of Transportation (Non-Medical): No  Physical Activity: Not on file  Stress: Not on file  Social Connections: Not on file  Intimate Partner Violence: Not At Risk (03/11/2022)   Humiliation, Afraid, Rape, and Kick questionnaire     Fear of Current or Ex-Partner: No    Emotionally Abused: No    Physically Abused: No    Sexually Abused: No    Family History  Problem Relation Age of Onset   Breast cancer Neg Hx      Vitals:   02/27/24 0400 02/27/24 0500 02/27/24 0700 02/27/24 0835  BP: (!) 118/50   126/62  Pulse: 89   90  Resp: 20   17  Temp: 98.4 F (36.9 C)   97.8 F (36.6 C)  TempSrc: Oral     SpO2: 100%   95%  Weight:  76.9 kg 79.6 kg   Height:        PHYSICAL EXAM General: Well appearing elderly female, well nourished, in no acute distress. HEENT: Normocephalic and atraumatic. Neck: No JVD.  Lungs: Normal respiratory effort on room air. Bibasilar crackles. Heart: HRRR. Normal S1 and S2 without gallops or murmurs.  Abdomen: Non-distended appearing.  Msk: Normal strength and tone for age. Extremities: Warm and well perfused. No clubbing, cyanosis. 1+ edema.  Neuro: Alert and oriented X 3. Psych: Answers questions appropriately.   Labs: Basic Metabolic Panel: Recent Labs    02/26/24 0858 02/27/24 0358  NA 131* 133*  K 4.2 3.3*  CL 96* 95*  CO2 22 23  GLUCOSE 110* 143*  BUN 14 22  CREATININE 0.82 0.81  CALCIUM  9.9 9.4   Liver Function Tests: Recent Labs    02/27/24 0358  AST 23  ALT 18  ALKPHOS 105  BILITOT <0.2  PROT 8.1  ALBUMIN 3.7   No results for input(s): LIPASE, AMYLASE in the last 72 hours. CBC: Recent Labs    02/26/24 0858 02/27/24 0358  WBC 7.2 6.8  HGB 10.6* 10.1*  HCT 32.5* 31.0*  MCV 86.7 86.8  PLT 225 228   Cardiac Enzymes: No results for input(s): CKTOTAL, CKMB, CKMBINDEX, TROPONINIHS in the last 72 hours. BNP: No results for input(s): BNP in the last 72 hours. D-Dimer: No results for input(s): DDIMER in the last 72 hours. Hemoglobin A1C: No results for input(s): HGBA1C in the last 72 hours. Fasting Lipid Panel: No results for input(s): CHOL, HDL, LDLCALC, TRIG, CHOLHDL, LDLDIRECT in the last 72 hours. Thyroid  Function  Tests: No results for input(s): TSH, T4TOTAL, T3FREE, THYROIDAB in the last 72 hours.  Invalid input(s): FREET3 Anemia Panel: No results for input(s): VITAMINB12, FOLATE, FERRITIN, TIBC, IRON, RETICCTPCT in the last 72 hours.   Radiology: DG Chest 2 View Result Date: 02/26/2024 CLINICAL DATA:  Shortness of breath. EXAM: CHEST - 2 VIEW COMPARISON:  03/17/2023 FINDINGS: Low volume film. The cardio pericardial silhouette is enlarged. Moderate hiatal hernia. There is pulmonary vascular congestion without overt pulmonary edema. Bandlike clustered nodularity overlies the upper chest, seen to be anterior to the patient on the lateral projection. IMPRESSION: 1. Low volume film with vascular congestion. 2. Moderate hiatal hernia. Electronically Signed   By: Camellia Candle M.D.   On: 02/26/2024 10:26    ECHO ordered  TELEMETRY (personally reviewed): sinus rhythm rate 80s   EKG (personally reviewed): NSR rate 100 bpm  Data reviewed by me 02/27/2024: last 24h vitals tele labs imaging I/O ED provider note, admission H&P, hospitalist progress note  Principal Problem:   CHF (congestive heart failure) (HCC) Active Problems:   HLD (hyperlipidemia)   Benign essential HTN   MGUS (monoclonal gammopathy of unknown significance)   Depression with anxiety   History of ischemic stroke   COPD with acute exacerbation (HCC)    ASSESSMENT AND PLAN:  IANTHA TITSWORTH is a 89 y.o. female  with a past medical history of HTN, HLD, anxiety/depression, left bundle branch block, breast cancer s/p radiation and chemotherapy, MGUS, tic douloureux, history of ischemic stroke without residual in December 2023 on aspirin , herpes zoster, dementia who presented to the ED on 02/26/2024 for shortness of breath and cough. Cardiology was consulted for further evaluation.   # Shortness of breath # Elevated BNP # Chronic LBBB # Hx CVA Patient presented with SOB, found to have elevated BNP at 1400 and vascular  congestion on CXR. Started  on IV lasix  in the ED.  -Echo ordered for evaluation, further recommendations pending these results.  -Continue IV lasix  20 mg twice daily. -Continue home carvedilol  12.5 mg twice daily. Consider addition of spironolactone tomorrow. Likely a poor candidate for SGLT2i.  -Continue atorvastatin  40 mg daily and aspirin  81 mg daily.  -Minimal troponin most consistent with demand/supply mismatch and not ACS.   This patient's plan of care was discussed and created with Dr. Ammon and he is in agreement.  Signed: Danita Bloch, PA-C  02/27/2024, 9:33 AM [facility] Cardiology

## 2024-02-27 NOTE — Progress Notes (Signed)
 Initial Nutrition Assessment  DOCUMENTATION CODES:   Not applicable  INTERVENTION:   Boost Breeze po TID, each supplement provides 250 kcal and 9 grams of protein  MVI po daily   Pt at high refeed risk; recommend monitor potassium, magnesium  and phosphorus labs daily until stable  Daily weights   NUTRITION DIAGNOSIS:   Inadequate oral intake related to acute illness as evidenced by per patient/family report.  GOAL:   Patient will meet greater than or equal to 90% of their needs  MONITOR:   Skin, PO intake, Supplement acceptance, Labs, Weight trends, I & O's  REASON FOR ASSESSMENT:   Consult Assessment of nutrition requirement/status  ASSESSMENT:   89 y/o female with h/o CHF, HLD, MGUS, COPD, depression, anxiety, CVA, HTN, GERD, breast cancer s/p radiation/chemotherapy and dementia who is admitted with CHF.  Met with pt and pt's daughter in room today. History provided by both pt and daughter. Daughter reports pt with fairly good appetite and oral intake at baseline but reports that her oral intake had decreased lately. Pt lives at [facility] and is able to go to [employer] three times daily for meals. Daughter reports pt with weight gain pta. Per chart, pt is up ~15lbs since March. Pt is documented to have eaten 75% of her breakfast (omelet). Pt ate the middle out of her grilled cheese sandwich for lunch today. RD discussed with pt the importance of adequate nutrition needed to preserve lean muscle. Pt would like to try Boost Breeze in hospital. Pt is likely at refeed risk.   Medications reviewed and include: aspirin , lovenox , lasix , protonix , KCl  Labs reviewed: Na 133(L), K 3.3(L) Pro BNP 1613(H)- 12/8 Hgb 10.1(L), Hct 31.0(L)  UOP-   NUTRITION - FOCUSED PHYSICAL EXAM:  Flowsheet Row Most Recent Value  Orbital Region No depletion  Upper Arm Region No depletion  Thoracic and Lumbar Region No depletion  Buccal Region No depletion  Temple Region Mild  depletion  Clavicle Bone Region Mild depletion  Clavicle and Acromion Bone Region Mild depletion  Scapular Bone Region No depletion  Dorsal Hand Mild depletion  Patellar Region No depletion  Anterior Thigh Region No depletion  Posterior Calf Region No depletion  Edema (RD Assessment) None  Hair Reviewed  Eyes Reviewed  Mouth Reviewed  Skin Reviewed  Nails Reviewed   Diet Order:   Diet Order             Diet regular Room service appropriate? Yes; Fluid consistency: Thin  Diet effective now                  EDUCATION NEEDS:   Education needs have been addressed  Skin:  Skin Assessment: Reviewed RN Assessment  Last BM:  12/8- TYPE 3  Height:   Ht Readings from Last 1 Encounters:  02/26/24 5' 5 (1.651 m)    Weight:   Wt Readings from Last 1 Encounters:  02/27/24 79.6 kg    Ideal Body Weight:  56.8 kg  BMI:  Body mass index is 29.2 kg/m.  Estimated Nutritional Needs:   Kcal:  1500-1700kcal/day  Protein:  75-85g/day  Fluid:  1.4-1.6L/day  Augustin Shams MS, RD, LDN If unable to be reached, please send secure chat to RD inpatient available from 8:00a-4:00p daily

## 2024-02-27 NOTE — Care Management Obs Status (Signed)
 MEDICARE OBSERVATION STATUS NOTIFICATION   Patient Details  Name: Carly Peck MRN: [id number] Date of Birth: 19-Jun-1931   Medicare Observation Status Notification Given:  No Patient was admitted.   Rojelio SHAUNNA Rattler 02/27/2024, 2:42 PM

## 2024-02-27 NOTE — TOC Initial Note (Addendum)
 Transition of Care [facility]) - Initial/Assessment Note    Patient Details  Name: Carly Peck MRN: [id number] Date of Birth: 03/05/1932  [facility]) CM/SW Contact:    Lauraine JAYSON Carpen, LCSW Phone Number: 02/27/2024, 3:52 PM  Clinical Narrative:    Patient only oriented to self. Per RN, no family at bedside. CSW called husband, introduced role, and explained that PT recommendations would be discussed. Patient lives at [facility]. CSW asked husband about going to the SNF side. He asked if she could just do therapy in her room at the ALF side. CSW left voicemail for the admissions coordinator to ask and have someone follow up with him. No further concerns. CSW will continue to follow patient and her husband for support and facilitate return to [facility] when stable.        4:08 pm: Received call back from [facility] admissions coordinator. She said patient will have to come to the SNF side if it is recommended. She will have the ALF staff contact husband.  Expected Discharge Plan:  (TBD) Barriers to Discharge: Continued Medical Work up   Patient Goals and CMS Choice            Expected Discharge Plan and Services     Post Acute Care Choice:  (TBD) Living arrangements for the past 2 months: Assisted Living Facility                                      Prior Living Arrangements/Services Living arrangements for the past 2 months: Assisted Living Facility Lives with:: Facility Resident Patient language and need for interpreter reviewed:: Yes Do you feel safe going back to the place where you live?: Yes      Need for Family Participation in Patient Care: Yes (Comment) Care giver support system in place?: Yes (comment)   Criminal Activity/Legal Involvement Pertinent to Current Situation/Hospitalization: No - Comment as needed  Activities of Daily Living      Permission Sought/Granted Permission sought to share information with : Facility Social Worker, Family Supports    Share Information with NAME: Mikyah Alamo  Permission granted to share info w AGENCY: [place]  Permission granted to share info w Relationship: Husband  Permission granted to share info w Contact Information: [phone]  Emotional Assessment Appearance:: Appears stated age Attitude/Demeanor/Rapport: Unable to Assess Affect (typically observed): Unable to Assess Orientation: : Oriented to Self Alcohol / Substance Use: Not Applicable Psych Involvement: No (comment)  Admission diagnosis:  CHF (congestive heart failure) (HCC) [I50.9] Bronchitis [J40] Acute congestive heart failure, unspecified heart failure type [facility]) [I50.9] Patient Active Problem List   Diagnosis Date Noted   CHF (congestive heart failure) (HCC) 02/26/2024   History of ischemic stroke 02/26/2024   COPD with acute exacerbation (HCC) 02/26/2024   Hyponatremia 03/11/2022   Altered mental status 03/10/2022   Acute stroke due to ischemia [facility]) 03/09/2022   Constipation 03/09/2022   Acute metabolic encephalopathy 03/08/2022   Herpes zoster 03/08/2022   Depression with anxiety 03/08/2022   Hypotension 03/08/2022   Abdominal pain 03/08/2022   Hypokalemia 03/08/2022   Elevated lactic acid level 03/08/2022   Acute urinary retention 03/08/2022   Syncope 03/08/2022   Hypomagnesemia 03/08/2022   Tic douloureux 08/15/2019   MGUS (monoclonal gammopathy of unknown significance) 12/15/2016   History of SCC (squamous cell carcinoma) of skin 09/15/2016   SCC (squamous  cell carcinoma), face 09/15/2016   Health care maintenance 10/07/2015   S/P total knee arthroplasty 09/14/2015   Leg pain 04/07/2015   Numbness and tingling 04/06/2015   Benign essential HTN 02/24/2015   Combined fat and carbohydrate induced hyperlipemia 02/24/2015   Edema leg 02/09/2015   Block, bundle branch, left 02/09/2015   Bilateral leg edema 02/09/2015   Age related osteoporosis 06/17/2014   Macroglobulinemia of  [place] (HCC) 06/17/2014   [place]'s macroglobulinemia (HCC) 06/17/2014   Generalized OA 11/30/2013   HLD (hyperlipidemia) 11/30/2013   Benign hypertension 11/30/2013   Headache, migraine 11/30/2013   Arthritis, degenerative 08/27/2013   PCP:  Lenon Layman ORN, MD Pharmacy:   [facility] [place], [place] - [address] [place] [address] Phone: [phone] Fax: [phone]     Social Drivers of Health (SDOH) Social History: SDOH Screenings   Food Insecurity: No Food Insecurity (02/09/2023)   Received from [facility]  Housing: Low Risk  (02/07/2024)   Received from [facility]  Transportation Needs: No Transportation Needs (02/09/2023)   Received from [facility]  Utilities: Not At Risk (02/09/2023)   Received from [facility]  Depression [phone]): Low Risk  (12/13/2023)  Financial Resource Strain: Low Risk  (02/09/2023)   Received from [facility]  Tobacco Use: Low Risk  (02/26/2024)   SDOH Interventions:     Readmission Risk Interventions     No data to display

## 2024-02-27 NOTE — Evaluation (Signed)
 Physical Therapy Evaluation Patient Details Name: Carly Peck MRN: [id number] DOB: May 01, 1931 Today's Date: 02/27/2024  History of Present Illness  Pt is a 89 y.o. female with medical history significant for HTN, HLD, anxiety/depression, left bundle branch block, breast cancer s/p radiation and chemotherapy, MGUS, tic [place], history of ischemic stroke without residual in December 2023 on aspirin , herpes zoster, dementia. MD assessment includes: shortness of breath, wheezing, cough, CHF  Clinical Impression  Pt was found in bed on arrival with daughter at beside, was lethargic, but otherwise motivated to participate during the session and put forth good effort throughout. Pts reported feeling achy all over with a sore throat. Her SpO2 was monitored and remained WNL throghout, with only mild shortness of breath during activity. Pt was able to perform bed mobility with min-mod assist for trunk support, needed max cueing for sequencing, and hand placement/reaching for bed rails. Pt was able to scoot to EOB with max cueing and max effort, though did not require physical assist. She was able to transfer to standing with rollator and CGA -and ambulated 87ft with CGA. Pt was a little unsteady initially, but did not experience knee buckling or overt LOB. Pt will benefit from continued PT services upon discharge to safely address deficits listed in patient problem list for decreased caregiver assistance and eventual return to PLOF.       If plan is discharge home, recommend the following: A little help with walking and/or transfers;A little help with bathing/dressing/bathroom;Assistance with cooking/housework;Assist for transportation;Help with stairs or ramp for entrance   Can travel by private vehicle   Yes    Equipment Recommendations    Recommendations for Other Services       Functional Status Assessment Patient has had a recent decline in their functional status and demonstrates the ability  to make significant improvements in function in a reasonable and predictable amount of time.     Precautions / Restrictions Precautions Precautions: Fall Recall of Precautions/Restrictions: Impaired Restrictions Weight Bearing Restrictions Per Provider Order: No      Mobility  Bed Mobility Overal bed mobility: Needs Assistance Bed Mobility: Supine to Sit     Supine to sit: Mod assist, Min assist     General bed mobility comments: cues for hand placement and trunk elevation    Transfers Overall transfer level: Needs assistance Equipment used: Rollator (4 wheels) Transfers: Sit to/from Stand Sit to Stand: Contact guard assist           General transfer comment: from EOB pt was able to stand with CGA using rollator    Ambulation/Gait Ambulation/Gait assistance: Contact guard assist Gait Distance (Feet): 75 Feet Assistive device: Rollator (4 wheels) Gait Pattern/deviations: Step-through pattern Gait velocity: decreased     General Gait Details: pt ambualted approx 75 ft with rollator, a little insteady initially, slow cadence, no overt LOB, no buckling, cues for sequencing  Stairs            Wheelchair Mobility     Tilt Bed    Modified Rankin (Stroke Patients Only)       Balance Overall balance assessment: Needs assistance Sitting-balance support: Feet unsupported Sitting balance-Leahy Scale: Good Sitting balance - Comments: sits EOB with supervision, supports upright pos with UE   Standing balance support: Reliant on assistive device for balance, Bilateral upper extremity supported Standing balance-Leahy Scale: Fair Standing balance comment: rollator, cueing for hand placement, CGA for satefy  Pertinent Vitals/Pain Pain Assessment Pain Assessment: Faces Faces Pain Scale: Hurts a little bit Breathing: occasional labored breathing, short period of hyperventilation Negative Vocalization: none Facial  Expression: smiling or inexpressive Body Language: relaxed Consolability: no need to console PAINAD Score: 1 Pain Descriptors / Indicators: Aching, Sore Pain Intervention(s): Monitored during session, Repositioned    Home Living Family/patient expects to be discharged to:: Assisted living                 Home Equipment: Rollator (4 wheels) Additional Comments: live with her spouse who is almost 49 yo    Prior Function Prior Level of Function : Needs assist             Mobility Comments: denies falls, uses rollator household distances and limited community distances slowly with labored breathing; uses elevator to get to ground floor ADLs Comments: aide helps with ADLs     Extremity/Trunk Assessment   Upper Extremity Assessment Upper Extremity Assessment: Defer to OT evaluation    Lower Extremity Assessment Lower Extremity Assessment: Generalized weakness       Communication   Communication Communication: Impaired Factors Affecting Communication: Hearing impaired    Cognition Arousal: Lethargic, Alert Behavior During Therapy: WFL for tasks assessed/performed, Flat affect   PT - Cognitive impairments: Memory, Awareness, Orientation, History of cognitive impairments, Problem solving, Sequencing, Initiation, Attention   Orientation impairments: Person, Place, Situation                     Following commands: Impaired Following commands impaired: Follows one step commands with increased time, Follows one step commands inconsistently     Cueing Cueing Techniques: Verbal cues, Gestural cues, Tactile cues     General Comments General comments (skin integrity, edema, etc.): pt reports feeling achy all over, with a sore throat, mobilized well, with occassional SOB -SpO2 WNL throughout    Exercises Other Exercises Other Exercises: edu on role of PT and dc recs with daughter   Assessment/Plan    PT Assessment Patient needs continued PT services  PT  Problem List Decreased strength;Decreased range of motion;Decreased activity tolerance;Decreased balance;Decreased mobility;Decreased coordination;Decreased cognition;Decreased safety awareness;Decreased knowledge of precautions;Pain       PT Treatment Interventions DME instruction;Gait training;Stair training;Functional mobility training;Therapeutic activities;Therapeutic exercise;Balance training;Cognitive remediation;Patient/family education    PT Goals (Current goals can be found in the Care Plan section)  Acute Rehab PT Goals Patient Stated Goal: get back home, feel better PT Goal Formulation: With patient/family Time For Goal Achievement: 03/11/24 Potential to Achieve Goals: Good    Frequency Min 2X/week     Co-evaluation               AM-PAC PT 6 Clicks Mobility  Outcome Measure Help needed turning from your back to your side while in a flat bed without using bedrails?: A Little Help needed moving from lying on your back to sitting on the side of a flat bed without using bedrails?: A Little Help needed moving to and from a bed to a chair (including a wheelchair)?: A Little Help needed standing up from a chair using your arms (e.g., wheelchair or bedside chair)?: A Little Help needed to walk in hospital room?: A Little Help needed climbing 3-5 steps with a railing? : A Lot 6 Click Score: 17    End of Session Equipment Utilized During Treatment: Gait belt Activity Tolerance: Patient limited by fatigue;Patient limited by lethargy Patient left: in chair;with family/visitor present;with call bell/phone within reach Nurse Communication: Mobility  status PT Visit Diagnosis: Muscle weakness (generalized) (M62.81);Difficulty in walking, not elsewhere classified (R26.2);Unsteadiness on feet (R26.81)    Time: 8876-8858 PT Time Calculation (min) (ACUTE ONLY): 18 min   Charges:                 Corean Newport, SPT 02/27/24, 12:53 PM

## 2024-02-28 LAB — BASIC METABOLIC PANEL WITH GFR
Anion gap: 15 (ref 5–15)
BUN: 33 mg/dL — ABNORMAL HIGH (ref 8–23)
CO2: 23 mmol/L (ref 22–32)
Calcium: 9.5 mg/dL (ref 8.9–10.3)
Chloride: 96 mmol/L — ABNORMAL LOW (ref 98–111)
Creatinine, Ser: 1.02 mg/dL — ABNORMAL HIGH (ref 0.44–1.00)
GFR, Estimated: 52 mL/min — ABNORMAL LOW (ref >60)
Glucose, Bld: 164 mg/dL — ABNORMAL HIGH (ref 70–99)
Potassium: 3.7 mmol/L (ref 3.5–5.1)
Sodium: 133 mmol/L — ABNORMAL LOW (ref 135–145)

## 2024-02-28 LAB — MAGNESIUM: Magnesium: 2.1 mg/dL (ref 1.7–2.4)

## 2024-02-28 LAB — PHOSPHORUS: Phosphorus: 4.6 mg/dL (ref 2.5–4.6)

## 2024-02-28 MED ORDER — FUROSEMIDE 20 MG PO TABS
20.0000 mg | ORAL_TABLET | Freq: Every day | ORAL | Status: DC
  Administered 2024-02-29: 20 mg via ORAL
  Filled 2024-02-28: qty 1

## 2024-02-28 NOTE — Progress Notes (Signed)
 [facility] CLINIC CARDIOLOGY PROGRESS NOTE       Patient ID: Carly Peck MRN: [id number] DOB/AGE: 07-13-1931 89 y.o.  Admit date: 02/26/2024 Referring Physician Dr. Nena Rebel Primary Physician Carly Peck ORN, MD  Primary Cardiologist None (saw Carly Peck 2017) Reason for Consultation CHF  HPI: Carly Peck is a 89 y.o. female  with a past medical history of HTN, HLD, anxiety/depression, left bundle branch block, breast cancer s/p radiation and chemotherapy, MGUS, tic [place], history of ischemic stroke without residual in December 2023 on aspirin , herpes zoster, dementia who presented to the ED on 02/26/2024 for shortness of breath and cough. Cardiology was consulted for further evaluation.   Interval history: -Patient seen and examined this AM, resting in hospital bed. She denies any SOB today.  -BP and HR stable. Cr slightly up today.  -Reviewed echo results with patient, daughter not present today and attempted to call with update but no answer.  Review of systems complete and found to be negative unless listed above    Past Medical History:  Diagnosis Date   Anxiety    Arthritis    Breast cancer (HCC) 1997   left breast cancer   Cancer (HCC)    Environmental and seasonal allergies    GERD (gastroesophageal reflux disease)    History of left breast cancer    Hyperlipidemia    Hypertension    Left bundle branch block (LBBB)    Neuropathy    Personal history of chemotherapy 1997   Left breast   Personal history of radiation therapy 1997   Left breast    Past Surgical History:  Procedure Laterality Date   BACK SURGERY     BREAST SURGERY     CARDIAC CATHETERIZATION     CYSTOSCOPY N/A 03/08/2022   Procedure: Foley placement;  Surgeon: Twylla Glendia BROCKS, MD;  Location: [facility] ORS;  Service: Urology;  Laterality: N/A;   DILATION AND CURETTAGE OF UTERUS     EYE SURGERY Bilateral    Cataract Extraction with IOL   KNEE ARTHROPLASTY Right 09/14/2015   Procedure:  COMPUTER ASSISTED TOTAL KNEE ARTHROPLASTY;  Surgeon: Lynwood SHAUNNA Hue, MD;  Location: [facility] ORS;  Service: Orthopedics;  Laterality: Right;   KNEE ARTHROPLASTY Left 05/30/2016   Procedure: COMPUTER ASSISTED TOTAL KNEE ARTHROPLASTY;  Surgeon: Lynwood SHAUNNA Hue, MD;  Location: [facility] ORS;  Service: Orthopedics;  Laterality: Left;   KYPHOPLASTY  2016   Dr. Kathlynn, [facility]   MASTECTOMY Left 1997   chemo rad mastectomy   TONSILLECTOMY      Medications Prior to Admission  Medication Sig Dispense Refill Last Dose/Taking   aspirin  EC 81 MG tablet Take 1 tablet (81 mg total) by mouth daily. Swallow whole. 30 tablet 0 02/25/2024   atorvastatin  (LIPITOR) 40 MG tablet Take 1 tablet (40 mg total) by mouth every evening. 30 tablet 0 02/25/2024   carvedilol  (COREG ) 12.5 MG tablet Take 12.5 mg by mouth 2 (two) times daily with a meal.   02/25/2024   cetirizine (ZYRTEC) 10 MG tablet Take 10 mg by mouth daily.   02/25/2024   Cholecalciferol  (VITAMIN D3) 2000 units TABS Take 2,000 Units by mouth daily.   02/25/2024   Cyanocobalamin  (VITAMIN B-12) 1000 MCG SUBL Place 1 tablet under the tongue every other day.   Past Week   DULoxetine  (CYMBALTA ) 30 MG capsule Take 30 mg by mouth daily.   02/25/2024   Fe Bisgly-Vit C-Vit B12-FA (GENTLE IRON) 28-60-0.008-0.4 MG CAPS Take by mouth daily.   02/25/2024  fluticasone  (FLONASE ) 50 MCG/ACT nasal spray Place 2 sprays into both nostrils 2 (two) times daily as needed for rhinitis.   02/25/2024   fluticasone -salmeterol (ADVAIR) 100-50 MCG/ACT AEPB Inhale 1 puff into the lungs 2 (two) times daily.   02/25/2024   memantine (NAMENDA) 5 MG tablet Take 5 mg by mouth 2 (two) times daily.   02/25/2024   Multiple Vitamins-Minerals (PRESERVISION AREDS 2) CAPS Take 1 capsule by mouth 2 (two) times daily.   02/25/2024   mupirocin ointment (BACTROBAN) 2 % Apply 1 Application topically 3 (three) times daily.   Past Week   pantoprazole  (PROTONIX ) 40 MG tablet Take 40 mg by mouth daily.   02/25/2024   potassium  chloride (KLOR-CON  M) 10 MEQ tablet Take 10 mEq by mouth daily.   02/25/2024   pregabalin (LYRICA) 25 MG capsule Take 25 mg by mouth 2 (two) times daily.   02/25/2024   senna-docusate (SENOKOT-S) 8.6-50 MG tablet Take 3 tablets by mouth at bedtime.   02/25/2024   Social History   Socioeconomic History   Marital status: Married    Spouse name: Not on file   Number of children: Not on file   Years of education: Not on file   Highest education level: Not on file  Occupational History   Not on file  Tobacco Use   Smoking status: Never   Smokeless tobacco: Never  Substance and Sexual Activity   Alcohol use: Yes    Alcohol/week: 1.0 standard drink of alcohol    Types: 1 Glasses of wine per week    Comment: occ.   Drug use: No   Sexual activity: Not on file  Other Topics Concern   Not on file  Social History Narrative   Not on file   Social Drivers of Health   Financial Resource Strain: Low Risk  (02/09/2023)   Received from [facility]   Overall Financial Resource Strain (CARDIA)    Difficulty of Paying Living Expenses: Not hard at all  Food Insecurity: No Food Insecurity (02/09/2023)   Received from [facility]   Hunger Vital Sign    Within the past 12 months, you worried that your food would run out before you got the money to buy more.: Never true    Within the past 12 months, the food you bought just didn't last and you didn't have money to get more.: Never true  Transportation Needs: No Transportation Needs (02/09/2023)   Received from [facility] - Transportation    In the past 12 months, has lack of transportation kept you from medical appointments or from getting medications?: No    Lack of Transportation (Non-Medical): No  Physical Activity: Not on file  Stress: Not on file  Social Connections: Not on file  Intimate Partner Violence: Not At Risk (03/11/2022)   Humiliation, Afraid, Rape, and Kick  questionnaire    Fear of Current or Ex-Partner: No    Emotionally Abused: No    Physically Abused: No    Sexually Abused: No    Family History  Problem Relation Age of Onset   Breast cancer Neg Hx      Vitals:   02/27/24 2314 02/28/24 0418 02/28/24 0500 02/28/24 0804  BP: (!) 120/58 136/80  131/69  Pulse: 74 72  78  Resp: 18 19  17   Temp: (!) 97.5 F (36.4 C) 98.3 F (36.8 C)  97.7 F (36.5 C)  TempSrc: Oral Oral  SpO2: 93% 95%  96%  Weight:   79.4 kg   Height:        PHYSICAL EXAM General: Well appearing elderly female, well nourished, in no acute distress. HEENT: Normocephalic and atraumatic. Neck: No JVD.  Lungs: Normal respiratory effort on room air. Bibasilar crackles. Heart: HRRR. Normal S1 and S2 without gallops or murmurs.  Abdomen: Non-distended appearing.  Msk: Normal strength and tone for age. Extremities: Warm and well perfused. No clubbing, cyanosis. Trace edema.  Neuro: Alert and oriented X 3. Psych: Answers questions appropriately.   Labs: Basic Metabolic Panel: Recent Labs    02/27/24 0358 02/28/24 0412  NA 133* 133*  K 3.3* 3.7  CL 95* 96*  CO2 23 23  GLUCOSE 143* 164*  BUN 22 33*  CREATININE 0.81 1.02*  CALCIUM  9.4 9.5  MG  --  2.1  PHOS  --  4.6   Liver Function Tests: Recent Labs    02/27/24 0358  AST 23  ALT 18  ALKPHOS 105  BILITOT <0.2  PROT 8.1  ALBUMIN 3.7   No results for input(s): LIPASE, AMYLASE in the last 72 hours. CBC: Recent Labs    02/26/24 0858 02/27/24 0358  WBC 7.2 6.8  HGB 10.6* 10.1*  HCT 32.5* 31.0*  MCV 86.7 86.8  PLT 225 228   Cardiac Enzymes: No results for input(s): CKTOTAL, CKMB, CKMBINDEX, TROPONINIHS in the last 72 hours. BNP: No results for input(s): BNP in the last 72 hours. D-Dimer: No results for input(s): DDIMER in the last 72 hours. Hemoglobin A1C: No results for input(s): HGBA1C in the last 72 hours. Fasting Lipid Panel: No results for input(s): CHOL,  HDL, LDLCALC, TRIG, CHOLHDL, LDLDIRECT in the last 72 hours. Thyroid  Function Tests: No results for input(s): TSH, T4TOTAL, T3FREE, THYROIDAB in the last 72 hours.  Invalid input(s): FREET3 Anemia Panel: No results for input(s): VITAMINB12, FOLATE, FERRITIN, TIBC, IRON, RETICCTPCT in the last 72 hours.   Radiology: ECHOCARDIOGRAM COMPLETE Result Date: 02/27/2024    ECHOCARDIOGRAM REPORT   Patient Name:   Carly Peck Date of Exam: 02/27/2024 Medical Rec #:  [id number]     Height:       65.0 in Accession #:    [id number]    Weight:       175.5 lb Date of Birth:  1932-01-13      BSA:          1.871 m Patient Age:    89 years      BP:           121/73 mmHg Patient Gender: F             HR:           83 bpm. Exam Location:  [facility] Procedure: 2D Echo, Cardiac Doppler and Color Doppler (Both Spectral and Color            Flow Doppler were utilized during procedure). Indications:     CHF-acute diastolic I50.31  History:         Patient has prior history of Echocardiogram examinations, most                  recent 03/10/2022. Arrythmias:LBBB; Risk Factors:Dyslipidemia                  and Hypertension. Breast cancer.  Sonographer:     Christopher Furnace Referring Phys:  [id number] Mari Battaglia Diagnosing Phys: Marsa Dooms MD  Sonographer Comments: Suboptimal apical window. IMPRESSIONS  1.  Left ventricular ejection fraction, by estimation, is 55 to 60%. The left ventricle has normal function. The left ventricle has no regional wall motion abnormalities. Left ventricular diastolic parameters are consistent with Grade I diastolic dysfunction (impaired relaxation).  2. Right ventricular systolic function is normal. The right ventricular size is normal.  3. The mitral valve is normal in structure. No evidence of mitral valve regurgitation. No evidence of mitral stenosis.  4. The aortic valve is normal in structure. Aortic valve regurgitation is not visualized. No aortic stenosis is present.   5. The inferior vena cava is normal in size with greater than 50% respiratory variability, suggesting right atrial pressure of 3 mmHg. FINDINGS  Left Ventricle: Left ventricular ejection fraction, by estimation, is 55 to 60%. The left ventricle has normal function. The left ventricle has no regional wall motion abnormalities. Strain was performed and the global longitudinal strain is indeterminate. The left ventricular internal cavity size was normal in size. There is no left ventricular hypertrophy. Left ventricular diastolic parameters are consistent with Grade I diastolic dysfunction (impaired relaxation). Right Ventricle: The right ventricular size is normal. No increase in right ventricular wall thickness. Right ventricular systolic function is normal. Left Atrium: Left atrial size was normal in size. Right Atrium: Right atrial size was normal in size. Pericardium: There is no evidence of pericardial effusion. Mitral Valve: The mitral valve is normal in structure. No evidence of mitral valve regurgitation. No evidence of mitral valve stenosis. MV peak gradient, 4.0 mmHg. The mean mitral valve gradient is 3.0 mmHg. Tricuspid Valve: The tricuspid valve is normal in structure. Tricuspid valve regurgitation is not demonstrated. No evidence of tricuspid stenosis. Aortic Valve: The aortic valve is normal in structure. Aortic valve regurgitation is not visualized. No aortic stenosis is present. Aortic valve mean gradient measures 4.7 mmHg. Aortic valve peak gradient measures 8.1 mmHg. Aortic valve area, by VTI measures 1.89 cm. Pulmonic Valve: The pulmonic valve was normal in structure. Pulmonic valve regurgitation is not visualized. No evidence of pulmonic stenosis. Aorta: The aortic root is normal in size and structure. Venous: The inferior vena cava is normal in size with greater than 50% respiratory variability, suggesting right atrial pressure of 3 mmHg. IAS/Shunts: No atrial level shunt detected by color flow  Doppler. Additional Comments: 3D was performed not requiring image post processing on an independent workstation and was indeterminate.  LEFT VENTRICLE PLAX 2D LVIDd:         3.20 cm   Diastology LVIDs:         2.30 cm   LV e' medial:    5.22 cm/s LV PW:         1.30 cm   LV E/e' medial:  16.0 LV IVS:        1.10 cm   LV e' lateral:   6.20 cm/s LVOT diam:     2.00 cm   LV E/e' lateral: 13.5 LV SV:         52 LV SV Index:   28 LVOT Area:     3.14 cm LV IVRT:       92 msec  RIGHT VENTRICLE RV Basal diam:  3.20 cm RV Mid diam:    2.40 cm LEFT ATRIUM             Index        RIGHT ATRIUM           Index LA diam:        3.90 cm 2.08 cm/m  RA Area:     15.80 cm LA Vol (A2C):   58.9 ml 31.48 ml/m  RA Volume:   36.40 ml  19.45 ml/m LA Vol (A4C):   38.1 ml 20.36 ml/m LA Biplane Vol: 49.0 ml 26.19 ml/m  AORTIC VALVE AV Area (Vmax):    1.87 cm AV Area (Vmean):   2.01 cm AV Area (VTI):     1.89 cm AV Vmax:           142.67 cm/s AV Vmean:          99.333 cm/s AV VTI:            0.276 m AV Peak Grad:      8.1 mmHg AV Mean Grad:      4.7 mmHg LVOT Vmax:         84.90 cm/s LVOT Vmean:        63.500 cm/s LVOT VTI:          0.166 m LVOT/AV VTI ratio: 0.60  AORTA Ao Root diam: 3.00 cm MITRAL VALVE               TRICUSPID VALVE MV Area (PHT): 4.46 cm    TR Peak grad:   11.7 mmHg MV Area VTI:   2.32 cm    TR Vmax:        171.00 cm/s MV Peak grad:  4.0 mmHg MV Mean grad:  3.0 mmHg    SHUNTS MV Vmax:       1.00 m/s    Systemic VTI:  0.17 m MV Vmean:      77.0 cm/s   Systemic Diam: 2.00 cm MV Decel Time: 170 msec MV E velocity: 83.60 cm/s MV A velocity: 85.70 cm/s MV E/A ratio:  0.98 Marsa Dooms MD Electronically signed by Marsa Dooms MD Signature Date/Time: 02/27/2024/4:53:31 PM    Final    DG Chest 2 View Result Date: 02/26/2024 CLINICAL DATA:  Shortness of breath. EXAM: CHEST - 2 VIEW COMPARISON:  03/17/2023 FINDINGS: Low volume film. The cardio pericardial silhouette is enlarged. Moderate hiatal hernia.  There is pulmonary vascular congestion without overt pulmonary edema. Bandlike clustered nodularity overlies the upper chest, seen to be anterior to the patient on the lateral projection. IMPRESSION: 1. Low volume film with vascular congestion. 2. Moderate hiatal hernia. Electronically Signed   By: Camellia Candle M.D.   On: 02/26/2024 10:26    ECHO as above  TELEMETRY (personally reviewed): not on tele   EKG (personally reviewed): NSR rate 100 bpm  Data reviewed by me 02/28/2024: last 24h vitals tele labs imaging I/O ED provider note, admission H&P, hospitalist progress note  Principal Problem:   CHF (congestive heart failure) (HCC) Active Problems:   HLD (hyperlipidemia)   Benign essential HTN   MGUS (monoclonal gammopathy of unknown significance)   Depression with anxiety   History of ischemic stroke   COPD with acute exacerbation (HCC)    ASSESSMENT AND PLAN:  Carly Peck is a 89 y.o. female  with a past medical history of HTN, HLD, anxiety/depression, left bundle branch block, breast cancer s/p radiation and chemotherapy, MGUS, tic [place], history of ischemic stroke without residual in December 2023 on aspirin , herpes zoster, dementia who presented to the ED on 02/26/2024 for shortness of breath and cough. Cardiology was consulted for further evaluation.   # Shortness of breath # Elevated BNP # Chronic LBBB # Hx CVA Patient presented with SOB, found to have elevated BNP at 1400 and vascular congestion  on CXR. Started on IV lasix  in the ED. Echo this admission with EF 55-60%, no WMAs, grade I diastolic dysfunction. -Transition to PO lasix  20 mg daily. -Continue home carvedilol  12.5 mg twice daily. Consider addition of spironolactone tomorrow. Likely a poor candidate for SGLT2i.  -Continue atorvastatin  40 mg daily and aspirin  81 mg daily.  -Minimal troponin most consistent with demand/supply mismatch and not ACS.   This patient's plan of care was discussed and created with  Dr. Ammon and he is in agreement.  Signed: Danita Bloch, PA-C  02/28/2024, 8:59 AM [facility]

## 2024-02-28 NOTE — Progress Notes (Signed)
 PROGRESS NOTE    Carly Peck  FMW:[id number] DOB: 06-07-1931 DOA: 02/26/2024 PCP: Lenon Layman ORN, MD   Brief Narrative:    89 y.o. female with medical history significant for HTN, breast cancer s/p radiation and chemotherapy, MGUS, tic [place], history of ischemic stroke without residual in December 2023, herpes zoster, dementia who was brought in from assisted living facility for shortness of breath, wheezing, cough that started the day prior to presentation.  Being treated for new onset acute diastolic CHF Cardiology on board.  Assessment & Plan:  Principal Problem:   CHF (congestive heart failure) (HCC) Active Problems:   Benign essential HTN   HLD (hyperlipidemia)   Depression with anxiety   MGUS (monoclonal gammopathy of unknown significance)   History of ischemic stroke   COPD with acute exacerbation (HCC)   New onset acute diastolic CHF,POA: Chronic LBBB -Received IV lasix . Switched to po lasix  20 mg daily with close monitoring of renal function and electrolytes. -Cardiology on board -ECHO done showed normal EF but Grade I diastolic dysfunction. -Fluid restriction of 1.5 L per day, daily weight and heart healthy diet. -continue with coreg  12.5 mg PO BID  Alzheimer's dementia: No acute issues  Anxiety/Major moderate depression,POA: -Continue with cymbalta   Hypertension: Continue with lasix  and coreg   Troponinemia, likely demand supply mismatch in the setting of CHF Cardiology on board.  Hyperlipidemia: continue with statin  H/o Stroke: continue with aspirin  and statin  Physical deconditioning: continue with PT  Disposition: She is [facility] she lives with her husband.    DVT prophylaxis: enoxaparin  (LOVENOX ) injection 40 mg Start: 02/26/24 2200 SCDs Start: 02/26/24 1129     Code Status: Full Code Family Communication:  daughter is at the bedside Status is: Inpatient Remains inpatient appropriate because: acute diastolic  CHF    Subjective:  She appears calm. She has dementia. Denies any chest pain or shortness of breath. I spoke to her daughter about the diagnosis of Acute diastolic CHF. Patient never smoked and has never been diagnosed with COPD.  Examination:  General exam: Appears calm and comfortable  Respiratory system: Clear to auscultation. Respiratory effort normal. Cardiovascular system: S1 & S2 heard, RRR. No JVD, murmurs, rubs, gallops or clicks. No pedal edema. Gastrointestinal system: Abdomen is nondistended, soft and nontender. No organomegaly or masses felt. Normal bowel sounds heard. Central nervous system: Alert and oriented x 1. No focal neurological deficits. Extremities: Symmetric 5 x 5 power. Skin: No rashes, lesions or ulcers      Diet Orders (From admission, onward)     Start     Ordered   02/26/24 1604  Diet regular Room service appropriate? Yes; Fluid consistency: Thin  Diet effective now       Question Answer Comment  Room service appropriate? Yes   Fluid consistency: Thin      02/26/24 1603            Objective: Vitals:   02/27/24 2314 02/28/24 0418 02/28/24 0500 02/28/24 0804  BP: (!) 120/58 136/80  131/69  Pulse: 74 72  78  Resp: 18 19  17   Temp: (!) 97.5 F (36.4 C) 98.3 F (36.8 C)  97.7 F (36.5 C)  TempSrc: Oral Oral    SpO2: 93% 95%  96%  Weight:   79.4 kg   Height:       No intake or output data in the 24 hours ending 02/28/24 1036 Filed Weights   02/27/24 0500 02/27/24 0700 02/28/24 0500  Weight: 76.9 kg 79.6 kg 79.4 kg    Scheduled Meds:  aspirin  EC  81 mg Oral Daily   atorvastatin   40 mg Oral QPM   carvedilol   12.5 mg Oral BID WC   DULoxetine   30 mg Oral Daily   enoxaparin  (LOVENOX ) injection  40 mg Subcutaneous Q24H   feeding supplement  1 Container Oral TID BM   [START ON 02/29/2024] furosemide   20 mg Oral Daily   multivitamin with minerals  1 tablet Oral Daily   pantoprazole  (PROTONIX ) IV  40 mg Intravenous Q12H   predniSONE    40 mg Oral Q breakfast   Continuous Infusions:  Nutritional status Signs/Symptoms: per patient/family report   Body mass index is 29.13 kg/m.  Data Reviewed:   CBC: Recent Labs  Lab 02/26/24 0858 02/27/24 0358  WBC 7.2 6.8  HGB 10.6* 10.1*  HCT 32.5* 31.0*  MCV 86.7 86.8  PLT 225 228   Basic Metabolic Panel: Recent Labs  Lab 02/26/24 0858 02/27/24 0358 02/28/24 0412  NA 131* 133* 133*  K 4.2 3.3* 3.7  CL 96* 95* 96*  CO2 22 23 23   GLUCOSE 110* 143* 164*  BUN 14 22 33*  CREATININE 0.82 0.81 1.02*  CALCIUM  9.9 9.4 9.5  MG  --   --  2.1  PHOS  --   --  4.6   GFR: Estimated Creatinine Clearance: 38.2 mL/min (A) (by C-G formula based on SCr of 1.02 mg/dL (H)). Liver Function Tests: Recent Labs  Lab 02/27/24 0358  AST 23  ALT 18  ALKPHOS 105  BILITOT <0.2  PROT 8.1  ALBUMIN 3.7   No results for input(s): LIPASE, AMYLASE in the last 168 hours. No results for input(s): AMMONIA in the last 168 hours. Coagulation Profile: Recent Labs  Lab 02/27/24 0358  INR 1.3*   Cardiac Enzymes: No results for input(s): CKTOTAL, CKMB, CKMBINDEX, TROPONINI in the last 168 hours. BNP (last 3 results) Recent Labs    02/26/24 0858  PROBNP 1,613.0*   HbA1C: No results for input(s): HGBA1C in the last 72 hours. CBG: No results for input(s): GLUCAP in the last 168 hours. Lipid Profile: No results for input(s): CHOL, HDL, LDLCALC, TRIG, CHOLHDL, LDLDIRECT in the last 72 hours. Thyroid  Function Tests: No results for input(s): TSH, T4TOTAL, FREET4, T3FREE, THYROIDAB in the last 72 hours. Anemia Panel: No results for input(s): VITAMINB12, FOLATE, FERRITIN, TIBC, IRON, RETICCTPCT in the last 72 hours. Sepsis Labs: Recent Labs  Lab 02/26/24 0858  PROCALCITON <0.10    Recent Results (from the past 240 hours)  Resp panel by RT-PCR (RSV, Flu A&B, Covid) Anterior Nasal Swab     Status: None   Collection Time: 02/26/24   8:58 AM   Specimen: Anterior Nasal Swab  Result Value Ref Range Status   SARS Coronavirus 2 by RT PCR NEGATIVE NEGATIVE Final    Comment: (NOTE) SARS-CoV-2 target nucleic acids are NOT DETECTED.  The SARS-CoV-2 RNA is generally detectable in upper respiratory specimens during the acute phase of infection. The lowest concentration of SARS-CoV-2 viral copies this assay can detect is 138 copies/mL. A negative result does not preclude SARS-Cov-2 infection and should not be used as the sole basis for treatment or other patient management decisions. A negative result may occur with  improper specimen collection/handling, submission of specimen other than nasopharyngeal swab, presence of viral mutation(s) within the areas targeted by this assay, and inadequate number of viral copies(<138 copies/mL). A negative result must be combined with clinical observations,  patient history, and epidemiological information. The expected result is Negative.  Fact Sheet for Patients:  bloggercourse.com  Fact Sheet for Healthcare Providers:  seriousbroker.it  This test is no t yet approved or cleared by the [place]  FDA and  has been authorized for detection and/or diagnosis of SARS-CoV-2 by FDA under an Emergency Use Authorization (EUA). This EUA will remain  in effect (meaning this test can be used) for the duration of the COVID-19 declaration under Section 564(b)(1) of the Act, 21 U.S.C.section 360bbb-3(b)(1), unless the authorization is terminated  or revoked sooner.       Influenza A by PCR NEGATIVE NEGATIVE Final   Influenza B by PCR NEGATIVE NEGATIVE Final    Comment: (NOTE) The Xpert Xpress SARS-CoV-2/FLU/RSV plus assay is intended as an aid in the diagnosis of influenza from Nasopharyngeal swab specimens and should not be used as a sole basis for treatment. Nasal washings and aspirates are unacceptable for Xpert Xpress  SARS-CoV-2/FLU/RSV testing.  Fact Sheet for Patients: bloggercourse.com  Fact Sheet for Healthcare Providers: seriousbroker.it  This test is not yet approved or cleared by the [place]  FDA and has been authorized for detection and/or diagnosis of SARS-CoV-2 by FDA under an Emergency Use Authorization (EUA). This EUA will remain in effect (meaning this test can be used) for the duration of the COVID-19 declaration under Section 564(b)(1) of the Act, 21 U.S.C. section 360bbb-3(b)(1), unless the authorization is terminated or revoked.     Resp Syncytial Virus by PCR NEGATIVE NEGATIVE Final    Comment: (NOTE) Fact Sheet for Patients: bloggercourse.com  Fact Sheet for Healthcare Providers: seriousbroker.it  This test is not yet approved or cleared by the [place]  FDA and has been authorized for detection and/or diagnosis of SARS-CoV-2 by FDA under an Emergency Use Authorization (EUA). This EUA will remain in effect (meaning this test can be used) for the duration of the COVID-19 declaration under Section 564(b)(1) of the Act, 21 U.S.C. section 360bbb-3(b)(1), unless the authorization is terminated or revoked.  Performed at [facility], [address] [place], [place] [address]          Radiology Studies: ECHOCARDIOGRAM COMPLETE Result Date: 02/27/2024    ECHOCARDIOGRAM REPORT   Patient Name:   Carly Peck Date of Exam: 02/27/2024 Medical Rec #:  [id number]     Height:       65.0 in Accession #:    [id number]    Weight:       175.5 lb Date of Birth:  [birth year]      BSA:          1.871 m Patient Age:    89 years      BP:           121/73 mmHg Patient Gender: F             HR:           83 bpm. Exam Location:  [facility] Procedure: 2D Echo, Cardiac Doppler and Color Doppler (Both Spectral and Color            Flow Doppler were utilized during procedure). Indications:      CHF-acute diastolic I50.31  History:         Patient has prior history of Echocardiogram examinations, most                  recent 03/10/2022. Arrythmias:LBBB; Risk Factors:Dyslipidemia  and Hypertension. Breast cancer.  Sonographer:     Christopher Furnace Referring Phys:  [id number] CARALYN HUDSON Diagnosing Phys: Marsa Dooms MD  Sonographer Comments: Suboptimal apical window. IMPRESSIONS  1. Left ventricular ejection fraction, by estimation, is 55 to 60%. The left ventricle has normal function. The left ventricle has no regional wall motion abnormalities. Left ventricular diastolic parameters are consistent with Grade I diastolic dysfunction (impaired relaxation).  2. Right ventricular systolic function is normal. The right ventricular size is normal.  3. The mitral valve is normal in structure. No evidence of mitral valve regurgitation. No evidence of mitral stenosis.  4. The aortic valve is normal in structure. Aortic valve regurgitation is not visualized. No aortic stenosis is present.  5. The inferior vena cava is normal in size with greater than 50% respiratory variability, suggesting right atrial pressure of 3 mmHg. FINDINGS  Left Ventricle: Left ventricular ejection fraction, by estimation, is 55 to 60%. The left ventricle has normal function. The left ventricle has no regional wall motion abnormalities. Strain was performed and the global longitudinal strain is indeterminate. The left ventricular internal cavity size was normal in size. There is no left ventricular hypertrophy. Left ventricular diastolic parameters are consistent with Grade I diastolic dysfunction (impaired relaxation). Right Ventricle: The right ventricular size is normal. No increase in right ventricular wall thickness. Right ventricular systolic function is normal. Left Atrium: Left atrial size was normal in size. Right Atrium: Right atrial size was normal in size. Pericardium: There is no evidence of pericardial  effusion. Mitral Valve: The mitral valve is normal in structure. No evidence of mitral valve regurgitation. No evidence of mitral valve stenosis. MV peak gradient, 4.0 mmHg. The mean mitral valve gradient is 3.0 mmHg. Tricuspid Valve: The tricuspid valve is normal in structure. Tricuspid valve regurgitation is not demonstrated. No evidence of tricuspid stenosis. Aortic Valve: The aortic valve is normal in structure. Aortic valve regurgitation is not visualized. No aortic stenosis is present. Aortic valve mean gradient measures 4.7 mmHg. Aortic valve peak gradient measures 8.1 mmHg. Aortic valve area, by VTI measures 1.89 cm. Pulmonic Valve: The pulmonic valve was normal in structure. Pulmonic valve regurgitation is not visualized. No evidence of pulmonic stenosis. Aorta: The aortic root is normal in size and structure. Venous: The inferior vena cava is normal in size with greater than 50% respiratory variability, suggesting right atrial pressure of 3 mmHg. IAS/Shunts: No atrial level shunt detected by color flow Doppler. Additional Comments: 3D was performed not requiring image post processing on an independent workstation and was indeterminate.  LEFT VENTRICLE PLAX 2D LVIDd:         3.20 cm   Diastology LVIDs:         2.30 cm   LV e' medial:    5.22 cm/s LV PW:         1.30 cm   LV E/e' medial:  16.0 LV IVS:        1.10 cm   LV e' lateral:   6.20 cm/s LVOT diam:     2.00 cm   LV E/e' lateral: 13.5 LV SV:         52 LV SV Index:   28 LVOT Area:     3.14 cm LV IVRT:       92 msec  RIGHT VENTRICLE RV Basal diam:  3.20 cm RV Mid diam:    2.40 cm LEFT ATRIUM             Index  RIGHT ATRIUM           Index LA diam:        3.90 cm 2.08 cm/m   RA Area:     15.80 cm LA Vol (A2C):   58.9 ml 31.48 ml/m  RA Volume:   36.40 ml  19.45 ml/m LA Vol (A4C):   38.1 ml 20.36 ml/m LA Biplane Vol: 49.0 ml 26.19 ml/m  AORTIC VALVE AV Area (Vmax):    1.87 cm AV Area (Vmean):   2.01 cm AV Area (VTI):     1.89 cm AV Vmax:            142.67 cm/s AV Vmean:          99.333 cm/s AV VTI:            0.276 m AV Peak Grad:      8.1 mmHg AV Mean Grad:      4.7 mmHg LVOT Vmax:         84.90 cm/s LVOT Vmean:        63.500 cm/s LVOT VTI:          0.166 m LVOT/AV VTI ratio: 0.60  AORTA Ao Root diam: 3.00 cm MITRAL VALVE               TRICUSPID VALVE MV Area (PHT): 4.46 cm    TR Peak grad:   11.7 mmHg MV Area VTI:   2.32 cm    TR Vmax:        171.00 cm/s MV Peak grad:  4.0 mmHg MV Mean grad:  3.0 mmHg    SHUNTS MV Vmax:       1.00 m/s    Systemic VTI:  0.17 m MV Vmean:      77.0 cm/s   Systemic Diam: 2.00 cm MV Decel Time: 170 msec MV E velocity: 83.60 cm/s MV A velocity: 85.70 cm/s MV E/A ratio:  0.98 Marsa Dooms MD Electronically signed by Marsa Dooms MD Signature Date/Time: 02/27/2024/4:53:31 PM    Final            LOS: 1 day   Time spent= 35 mins    Deliliah Room, MD [facility]  If 7PM-7AM, please contact night-coverage  02/28/2024, 10:36 AM

## 2024-02-28 NOTE — Progress Notes (Signed)
 Mobility Specialist Progress Note:    02/28/24 1116  Mobility  Activity Ambulated with assistance  Level of Assistance Contact guard assist, steadying assist  Assistive Device Four wheel walker  Distance Ambulated (ft) 160 ft  Range of Motion/Exercises Active;All extremities  Activity Response Tolerated well  Mobility visit 1 Mobility  Mobility Specialist Start Time (ACUTE ONLY) 1058  Mobility Specialist Stop Time (ACUTE ONLY) 1116  Mobility Specialist Time Calculation (min) (ACUTE ONLY) 18 min   Pt received in chair, daughter in room. Agreeable to mobility, required CGA to stand and ambulate with rollator. Tolerated well, asx throughout. Returned to chair, alarm on and belongings in reach. All needs met.  Sherrilee Ditty Mobility Specialist Please contact via Special Educational Needs Teacher or  Rehab office at [phone]

## 2024-02-28 NOTE — Plan of Care (Signed)

## 2024-02-29 LAB — BASIC METABOLIC PANEL WITH GFR
Anion gap: 12 (ref 5–15)
BUN: 33 mg/dL — ABNORMAL HIGH (ref 8–23)
CO2: 25 mmol/L (ref 22–32)
Calcium: 9.2 mg/dL (ref 8.9–10.3)
Chloride: 96 mmol/L — ABNORMAL LOW (ref 98–111)
Creatinine, Ser: 0.91 mg/dL (ref 0.44–1.00)
GFR, Estimated: 60 mL/min — ABNORMAL LOW (ref >60)
Glucose, Bld: 100 mg/dL — ABNORMAL HIGH (ref 70–99)
Potassium: 3.4 mmol/L — ABNORMAL LOW (ref 3.5–5.1)
Sodium: 133 mmol/L — ABNORMAL LOW (ref 135–145)

## 2024-02-29 LAB — CBC
HCT: 29.9 % — ABNORMAL LOW (ref 36.0–46.0)
Hemoglobin: 9.9 g/dL — ABNORMAL LOW (ref 12.0–15.0)
MCH: 28.3 pg (ref 26.0–34.0)
MCHC: 33.1 g/dL (ref 30.0–36.0)
MCV: 85.4 fL (ref 80.0–100.0)
Platelets: 269 K/uL (ref 150–400)
RBC: 3.5 MIL/uL — ABNORMAL LOW (ref 3.87–5.11)
RDW: 15.1 % (ref 11.5–15.5)
WBC: 11.5 K/uL — ABNORMAL HIGH (ref 4.0–10.5)
nRBC: 0 % (ref 0.0–0.2)

## 2024-02-29 MED ORDER — ALBUTEROL SULFATE HFA 108 (90 BASE) MCG/ACT IN AERS: 2.0000 | INHALATION_SPRAY | Freq: Four times a day (QID) | RESPIRATORY_TRACT | 0 refills | Status: AC | PRN

## 2024-02-29 MED ORDER — PANTOPRAZOLE SODIUM 40 MG PO TBEC
40.0000 mg | DELAYED_RELEASE_TABLET | Freq: Every day | ORAL | Status: DC
  Administered 2024-02-29: 40 mg via ORAL
  Filled 2024-02-29: qty 1

## 2024-02-29 MED ORDER — POTASSIUM CHLORIDE CRYS ER 20 MEQ PO TBCR
40.0000 meq | EXTENDED_RELEASE_TABLET | Freq: Once | ORAL | Status: AC
  Administered 2024-02-29: 40 meq via ORAL
  Filled 2024-02-29: qty 2

## 2024-02-29 MED ORDER — SPIRONOLACTONE 12.5 MG HALF TABLET
12.5000 mg | ORAL_TABLET | Freq: Every day | ORAL | Status: DC
  Administered 2024-02-29: 12.5 mg via ORAL
  Filled 2024-02-29: qty 1

## 2024-02-29 NOTE — Plan of Care (Signed)
  Problem: Clinical Measurements: Goal: Ability to maintain clinical measurements within normal limits will improve Outcome: Progressing   Problem: Activity: Goal: Risk for activity intolerance will decrease Outcome: Progressing   Problem: Pain Managment: Goal: General experience of comfort will improve and/or be controlled Outcome: Progressing   Problem: Safety: Goal: Ability to remain free from injury will improve Outcome: Progressing   Problem: Skin Integrity: Goal: Risk for impaired skin integrity will decrease Outcome: Progressing

## 2024-02-29 NOTE — Discharge Summary (Addendum)
 Physician Discharge Summary   Patient: Carly Peck MRN: [id number] DOB: [birth year]  Admit date:     02/26/2024  Discharge date: 02/29/2024  Discharge Physician: Nena Rebel   PCP: Lenon Layman ORN, MD   Recommendations at discharge:   Continue Lasix  and spironolactone as prescribed. Follow-up with cardiology as outpatient, check BMP at PCPs office within 2 weeks Continue to use albuterol  inhaler as needed  Discharge Diagnoses: Principal Problem:   CHF (congestive heart failure) (HCC) Active Problems:   Benign essential HTN   HLD (hyperlipidemia)   Depression with anxiety   MGUS (monoclonal gammopathy of unknown significance)   History of ischemic stroke   COPD with acute exacerbation (HCC)  Resolved Problems:   * No resolved hospital problems. [facility] Course: 89 y.o. female with medical history significant for HTN, breast cancer s/p radiation and chemotherapy, MGUS, tic [place], history of ischemic stroke without residual in December 2023, herpes zoster, dementia who was brought in from assisted living facility for shortness of breath, wheezing, cough that started the day prior to presentation.  Being treated for new onset acute diastolic CHF  Assessment and Plan: She was admitted with a diagnosis of acute diastolic congestive heart failure.  Evaluated by cardiology.  Received echocardiogram which showed normal ejection fraction but there was some diastolic dysfunction.  Patient has history of HLD, HTN, COPD, history of stroke, MGUS, depression and anxiety.  She will continue her home medications.  She will be continued on Lasix  20 mg and spironolactone 12.5 mg daily.  She will follow-up with cardiology as well as primary care provider.  She will also be taking albuterol  inhaler as needed.  She has mildly low potassium but she is on spironolactone.  Her BMP needs to be monitored as outpatient.  She will be discharged to [facility] assisted living facility today.      Pain control - [place]  Controlled Substance Reporting System database was reviewed. and patient was instructed, not to drive, operate heavy machinery, perform activities at heights, swimming or participation in water activities or provide baby-sitting services while on Pain, Sleep and Anxiety Medications; until their outpatient Physician has advised to do so again. Also recommended to not to take more than prescribed Pain, Sleep and Anxiety Medications.  Consultants: Cardiology Procedures performed: Echocardiogram Disposition: Home Diet recommendation:  Cardiac diet DISCHARGE MEDICATION: Allergies as of 02/29/2024       Reactions   Adhesive [tape] Other (See Comments)   Red mark, Please use paper tape        Medication List     TAKE these medications    albuterol  108 (90 Base) MCG/ACT inhaler Commonly known as: VENTOLIN  HFA Inhale 2 puffs into the lungs every 6 (six) hours as needed for wheezing or shortness of breath.   aspirin  EC 81 MG tablet Take 1 tablet (81 mg total) by mouth daily. Swallow whole.   atorvastatin  40 MG tablet Commonly known as: LIPITOR Take 1 tablet (40 mg total) by mouth every evening.   carvedilol  12.5 MG tablet Commonly known as: COREG  Take 12.5 mg by mouth 2 (two) times daily with a meal.   cetirizine 10 MG tablet Commonly known as: ZYRTEC Take 10 mg by mouth daily.   DULoxetine  30 MG capsule Commonly known as: CYMBALTA  Take 30 mg by mouth daily.   fluticasone  50 MCG/ACT nasal spray Commonly known as: FLONASE  Place 2 sprays into both nostrils 2 (two) times daily as needed for rhinitis.   fluticasone -salmeterol 100-50 MCG/ACT  Aepb Commonly known as: ADVAIR Inhale 1 puff into the lungs 2 (two) times daily.   furosemide  20 MG tablet Commonly known as: LASIX  Take 1 tablet (20 mg total) by mouth daily. Start taking on: March 01, 2024   Gentle Iron 28-60-0.008-0.4 MG Caps Generic drug: Fe Bisgly-Vit C-Vit B12-FA Take by  mouth daily.   memantine 5 MG tablet Commonly known as: NAMENDA Take 5 mg by mouth 2 (two) times daily.   mupirocin ointment 2 % Commonly known as: BACTROBAN Apply 1 Application topically 3 (three) times daily.   pantoprazole  40 MG tablet Commonly known as: PROTONIX  Take 40 mg by mouth daily.   potassium chloride  10 MEQ tablet Commonly known as: KLOR-CON  M Take 10 mEq by mouth daily.   pregabalin 25 MG capsule Commonly known as: LYRICA Take 25 mg by mouth 2 (two) times daily.   PreserVision AREDS 2 Caps Take 1 capsule by mouth 2 (two) times daily.   senna-docusate 8.6-50 MG tablet Commonly known as: Senokot-S Take 3 tablets by mouth at bedtime.   spironolactone 25 MG tablet Commonly known as: ALDACTONE Take 0.5 tablets (12.5 mg total) by mouth daily. Start taking on: March 01, 2024   Vitamin B-12 1000 MCG Subl Place 1 tablet under the tongue every other day.   Vitamin D3 50 MCG (2000 UT) Tabs Take 2,000 Units by mouth daily.        Follow-up Information     Alluri, Keller BROCKS, MD. Go in 1 week(s).   Specialty: Cardiology Contact information: [address] [place] [address] [phone]                Discharge Exam: Fredricka Weights   02/27/24 0700 02/28/24 0500 02/29/24 0454  Weight: 79.6 kg 79.4 kg 77.6 kg   Seen and examined at bedside. Alert awake and oriented Lungs are clear to auscultation Heart regular S1-S2 Abdomen soft bowel sounds are present Extremities no edema  Condition at discharge: stable  The results of significant diagnostics from this hospitalization (including imaging, microbiology, ancillary and laboratory) are listed below for reference.   Imaging Studies: ECHOCARDIOGRAM COMPLETE Result Date: 02/27/2024    ECHOCARDIOGRAM REPORT   Patient Name:   Carly Peck Date of Exam: 02/27/2024 Medical Rec #:  [id number]     Height:       65.0 in Accession #:    [id number]    Weight:       175.5 lb Date of Birth:   04-08-1931      BSA:          1.871 m Patient Age:    89 years      BP:           121/73 mmHg Patient Gender: F             HR:           83 bpm. Exam Location:  [facility] Procedure: 2D Echo, Cardiac Doppler and Color Doppler (Both Spectral and Color            Flow Doppler were utilized during procedure). Indications:     CHF-acute diastolic I50.31  History:         Patient has prior history of Echocardiogram examinations, most                  recent 03/10/2022. Arrythmias:LBBB; Risk Factors:Dyslipidemia                  and Hypertension. Breast cancer.  Sonographer:     Christopher Furnace Referring Phys:  [id number] CARALYN HUDSON Diagnosing Phys: Marsa Dooms MD  Sonographer Comments: Suboptimal apical window. IMPRESSIONS  1. Left ventricular ejection fraction, by estimation, is 55 to 60%. The left ventricle has normal function. The left ventricle has no regional wall motion abnormalities. Left ventricular diastolic parameters are consistent with Grade I diastolic dysfunction (impaired relaxation).  2. Right ventricular systolic function is normal. The right ventricular size is normal.  3. The mitral valve is normal in structure. No evidence of mitral valve regurgitation. No evidence of mitral stenosis.  4. The aortic valve is normal in structure. Aortic valve regurgitation is not visualized. No aortic stenosis is present.  5. The inferior vena cava is normal in size with greater than 50% respiratory variability, suggesting right atrial pressure of 3 mmHg. FINDINGS  Left Ventricle: Left ventricular ejection fraction, by estimation, is 55 to 60%. The left ventricle has normal function. The left ventricle has no regional wall motion abnormalities. Strain was performed and the global longitudinal strain is indeterminate. The left ventricular internal cavity size was normal in size. There is no left ventricular hypertrophy. Left ventricular diastolic parameters are consistent with Grade I diastolic dysfunction (impaired  relaxation). Right Ventricle: The right ventricular size is normal. No increase in right ventricular wall thickness. Right ventricular systolic function is normal. Left Atrium: Left atrial size was normal in size. Right Atrium: Right atrial size was normal in size. Pericardium: There is no evidence of pericardial effusion. Mitral Valve: The mitral valve is normal in structure. No evidence of mitral valve regurgitation. No evidence of mitral valve stenosis. MV peak gradient, 4.0 mmHg. The mean mitral valve gradient is 3.0 mmHg. Tricuspid Valve: The tricuspid valve is normal in structure. Tricuspid valve regurgitation is not demonstrated. No evidence of tricuspid stenosis. Aortic Valve: The aortic valve is normal in structure. Aortic valve regurgitation is not visualized. No aortic stenosis is present. Aortic valve mean gradient measures 4.7 mmHg. Aortic valve peak gradient measures 8.1 mmHg. Aortic valve area, by VTI measures 1.89 cm. Pulmonic Valve: The pulmonic valve was normal in structure. Pulmonic valve regurgitation is not visualized. No evidence of pulmonic stenosis. Aorta: The aortic root is normal in size and structure. Venous: The inferior vena cava is normal in size with greater than 50% respiratory variability, suggesting right atrial pressure of 3 mmHg. IAS/Shunts: No atrial level shunt detected by color flow Doppler. Additional Comments: 3D was performed not requiring image post processing on an independent workstation and was indeterminate.  LEFT VENTRICLE PLAX 2D LVIDd:         3.20 cm   Diastology LVIDs:         2.30 cm   LV e' medial:    5.22 cm/s LV PW:         1.30 cm   LV E/e' medial:  16.0 LV IVS:        1.10 cm   LV e' lateral:   6.20 cm/s LVOT diam:     2.00 cm   LV E/e' lateral: 13.5 LV SV:         52 LV SV Index:   28 LVOT Area:     3.14 cm LV IVRT:       92 msec  RIGHT VENTRICLE RV Basal diam:  3.20 cm RV Mid diam:    2.40 cm LEFT ATRIUM             Index  RIGHT ATRIUM            Index LA diam:        3.90 cm 2.08 cm/m   RA Area:     15.80 cm LA Vol (A2C):   58.9 ml 31.48 ml/m  RA Volume:   36.40 ml  19.45 ml/m LA Vol (A4C):   38.1 ml 20.36 ml/m LA Biplane Vol: 49.0 ml 26.19 ml/m  AORTIC VALVE AV Area (Vmax):    1.87 cm AV Area (Vmean):   2.01 cm AV Area (VTI):     1.89 cm AV Vmax:           142.67 cm/s AV Vmean:          99.333 cm/s AV VTI:            0.276 m AV Peak Grad:      8.1 mmHg AV Mean Grad:      4.7 mmHg LVOT Vmax:         84.90 cm/s LVOT Vmean:        63.500 cm/s LVOT VTI:          0.166 m LVOT/AV VTI ratio: 0.60  AORTA Ao Root diam: 3.00 cm MITRAL VALVE               TRICUSPID VALVE MV Area (PHT): 4.46 cm    TR Peak grad:   11.7 mmHg MV Area VTI:   2.32 cm    TR Vmax:        171.00 cm/s MV Peak grad:  4.0 mmHg MV Mean grad:  3.0 mmHg    SHUNTS MV Vmax:       1.00 m/s    Systemic VTI:  0.17 m MV Vmean:      77.0 cm/s   Systemic Diam: 2.00 cm MV Decel Time: 170 msec MV E velocity: 83.60 cm/s MV A velocity: 85.70 cm/s MV E/A ratio:  0.98 Marsa Dooms MD Electronically signed by Marsa Dooms MD Signature Date/Time: 02/27/2024/4:53:31 PM    Final    DG Chest 2 View Result Date: 02/26/2024 CLINICAL DATA:  Shortness of breath. EXAM: CHEST - 2 VIEW COMPARISON:  03/17/2023 FINDINGS: Low volume film. The cardio pericardial silhouette is enlarged. Moderate hiatal hernia. There is pulmonary vascular congestion without overt pulmonary edema. Bandlike clustered nodularity overlies the upper chest, seen to be anterior to the patient on the lateral projection. IMPRESSION: 1. Low volume film with vascular congestion. 2. Moderate hiatal hernia. Electronically Signed   By: Camellia Candle M.D.   On: 02/26/2024 10:26    Microbiology: Results for orders placed or performed during the hospital encounter of 02/26/24  Resp panel by RT-PCR (RSV, Flu A&B, Covid) Anterior Nasal Swab     Status: None   Collection Time: 02/26/24  8:58 AM   Specimen: Anterior Nasal Swab   Result Value Ref Range Status   SARS Coronavirus 2 by RT PCR NEGATIVE NEGATIVE Final    Comment: (NOTE) SARS-CoV-2 target nucleic acids are NOT DETECTED.  The SARS-CoV-2 RNA is generally detectable in upper respiratory specimens during the acute phase of infection. The lowest concentration of SARS-CoV-2 viral copies this assay can detect is 138 copies/mL. A negative result does not preclude SARS-Cov-2 infection and should not be used as the sole basis for treatment or other patient management decisions. A negative result may occur with  improper specimen collection/handling, submission of specimen other than nasopharyngeal swab, presence of viral mutation(s) within the areas targeted by this assay, and  inadequate number of viral copies(<138 copies/mL). A negative result must be combined with clinical observations, patient history, and epidemiological information. The expected result is Negative.  Fact Sheet for Patients:  bloggercourse.com  Fact Sheet for Healthcare Providers:  seriousbroker.it  This test is no t yet approved or cleared by the [place]  FDA and  has been authorized for detection and/or diagnosis of SARS-CoV-2 by FDA under an Emergency Use Authorization (EUA). This EUA will remain  in effect (meaning this test can be used) for the duration of the COVID-19 declaration under Section 564(b)(1) of the Act, 21 U.S.C.section 360bbb-3(b)(1), unless the authorization is terminated  or revoked sooner.       Influenza A by PCR NEGATIVE NEGATIVE Final   Influenza B by PCR NEGATIVE NEGATIVE Final    Comment: (NOTE) The Xpert Xpress SARS-CoV-2/FLU/RSV plus assay is intended as an aid in the diagnosis of influenza from Nasopharyngeal swab specimens and should not be used as a sole basis for treatment. Nasal washings and aspirates are unacceptable for Xpert Xpress SARS-CoV-2/FLU/RSV testing.  Fact Sheet for  Patients: bloggercourse.com  Fact Sheet for Healthcare Providers: seriousbroker.it  This test is not yet approved or cleared by the [place]  FDA and has been authorized for detection and/or diagnosis of SARS-CoV-2 by FDA under an Emergency Use Authorization (EUA). This EUA will remain in effect (meaning this test can be used) for the duration of the COVID-19 declaration under Section 564(b)(1) of the Act, 21 U.S.C. section 360bbb-3(b)(1), unless the authorization is terminated or revoked.     Resp Syncytial Virus by PCR NEGATIVE NEGATIVE Final    Comment: (NOTE) Fact Sheet for Patients: bloggercourse.com  Fact Sheet for Healthcare Providers: seriousbroker.it  This test is not yet approved or cleared by the [place]  FDA and has been authorized for detection and/or diagnosis of SARS-CoV-2 by FDA under an Emergency Use Authorization (EUA). This EUA will remain in effect (meaning this test can be used) for the duration of the COVID-19 declaration under Section 564(b)(1) of the Act, 21 U.S.C. section 360bbb-3(b)(1), unless the authorization is terminated or revoked.  Performed at [facility], [address] [place], [place] [address]     Labs: CBC: Recent Labs  Lab 02/26/24 [phone] 02/27/24 0358 02/29/24 0519  WBC 7.2 6.8 11.5*  HGB 10.6* 10.1* 9.9*  HCT 32.5* 31.0* 29.9*  MCV 86.7 86.8 85.4  PLT 225 228 269   Basic Metabolic Panel: Recent Labs  Lab 02/26/24 0858 02/27/24 0358 02/28/24 0412 02/29/24 0519  NA 131* 133* 133* 133*  K 4.2 3.3* 3.7 3.4*  CL 96* 95* 96* 96*  CO2 22 23 23 25   GLUCOSE 110* 143* 164* 100*  BUN 14 22 33* 33*  CREATININE 0.82 0.81 1.02* 0.91  CALCIUM  9.9 9.4 9.5 9.2  MG  --   --  2.1  --   PHOS  --   --  4.6  --    Liver Function Tests: Recent Labs  Lab 02/27/24 0358  AST 23  ALT 18  ALKPHOS 105  BILITOT <0.2   PROT 8.1  ALBUMIN 3.7   CBG: No results for input(s): GLUCAP in the last 168 hours.  Discharge time spent: greater than 30 minutes.  Signed: Nena Rebel, MD [facility] 02/29/2024

## 2024-02-29 NOTE — Hospital Course (Signed)
 89 y.o. female with medical history significant for HTN, breast cancer s/p radiation and chemotherapy, MGUS, tic [place], history of ischemic stroke without residual in December 2023, herpes zoster, dementia who was brought in from assisted living facility for shortness of breath, wheezing, cough that started the day prior to presentation.  Being treated for new onset acute diastolic CHF

## 2024-02-29 NOTE — Progress Notes (Signed)
 [facility] CLINIC CARDIOLOGY PROGRESS NOTE       Patient ID: Carly Peck MRN: [id number] DOB/AGE: [birth year] 89 y.o.  Admit date: 02/26/2024 Referring Physician Dr. Nena Rebel Primary Physician Lenon Layman ORN, MD  Primary Cardiologist None (saw Hester 2017) Reason for Consultation CHF  HPI: Carly Peck is a 89 y.o. female  with a past medical history of HTN, HLD, anxiety/depression, left bundle branch block, breast cancer s/p radiation and chemotherapy, MGUS, tic [place], history of ischemic stroke without residual in December 2023 on aspirin , herpes zoster, dementia who presented to the ED on 02/26/2024 for shortness of breath and cough. Cardiology was consulted for further evaluation.   Interval history: -Patient seen and examined this AM, sitting up in bedside chair eating breakfast with daughter at bedside.  -Denies SOB or CP. -BP and HR stable. Cr improved this AM.  Review of systems complete and found to be negative unless listed above    Past Medical History:  Diagnosis Date   Anxiety    Arthritis    Breast cancer (HCC) 1997   left breast cancer   Cancer (HCC)    Environmental and seasonal allergies    GERD (gastroesophageal reflux disease)    History of left breast cancer    Hyperlipidemia    Hypertension    Left bundle branch block (LBBB)    Neuropathy    Personal history of chemotherapy 1997   Left breast   Personal history of radiation therapy 1997   Left breast    Past Surgical History:  Procedure Laterality Date   BACK SURGERY     BREAST SURGERY     CARDIAC CATHETERIZATION     CYSTOSCOPY N/A 03/08/2022   Procedure: Foley placement;  Surgeon: Twylla Glendia BROCKS, MD;  Location: [facility] ORS;  Service: Urology;  Laterality: N/A;   DILATION AND CURETTAGE OF UTERUS     EYE SURGERY Bilateral    Cataract Extraction with IOL   KNEE ARTHROPLASTY Right 09/14/2015   Procedure: COMPUTER ASSISTED TOTAL KNEE ARTHROPLASTY;  Surgeon: Lynwood SHAUNNA Hue, MD;   Location: [facility] ORS;  Service: Orthopedics;  Laterality: Right;   KNEE ARTHROPLASTY Left 05/30/2016   Procedure: COMPUTER ASSISTED TOTAL KNEE ARTHROPLASTY;  Surgeon: Lynwood SHAUNNA Hue, MD;  Location: [facility] ORS;  Service: Orthopedics;  Laterality: Left;   KYPHOPLASTY  2016   Dr. Kathlynn, [facility]   MASTECTOMY Left 1997   chemo rad mastectomy   TONSILLECTOMY      Medications Prior to Admission  Medication Sig Dispense Refill Last Dose/Taking   aspirin  EC 81 MG tablet Take 1 tablet (81 mg total) by mouth daily. Swallow whole. 30 tablet 0 02/25/2024   atorvastatin  (LIPITOR) 40 MG tablet Take 1 tablet (40 mg total) by mouth every evening. 30 tablet 0 02/25/2024   carvedilol  (COREG ) 12.5 MG tablet Take 12.5 mg by mouth 2 (two) times daily with a meal.   02/25/2024   cetirizine (ZYRTEC) 10 MG tablet Take 10 mg by mouth daily.   02/25/2024   Cholecalciferol  (VITAMIN D3) 2000 units TABS Take 2,000 Units by mouth daily.   02/25/2024   Cyanocobalamin  (VITAMIN B-12) 1000 MCG SUBL Place 1 tablet under the tongue every other day.   Past Week   DULoxetine  (CYMBALTA ) 30 MG capsule Take 30 mg by mouth daily.   02/25/2024   Fe Bisgly-Vit C-Vit B12-FA (GENTLE IRON) 28-60-0.008-0.4 MG CAPS Take by mouth daily.   02/25/2024   fluticasone  (FLONASE ) 50 MCG/ACT nasal spray Place 2 sprays into both  nostrils 2 (two) times daily as needed for rhinitis.   02/25/2024   fluticasone -salmeterol (ADVAIR) 100-50 MCG/ACT AEPB Inhale 1 puff into the lungs 2 (two) times daily.   02/25/2024   memantine (NAMENDA) 5 MG tablet Take 5 mg by mouth 2 (two) times daily.   02/25/2024   Multiple Vitamins-Minerals (PRESERVISION AREDS 2) CAPS Take 1 capsule by mouth 2 (two) times daily.   02/25/2024   mupirocin ointment (BACTROBAN) 2 % Apply 1 Application topically 3 (three) times daily.   Past Week   pantoprazole  (PROTONIX ) 40 MG tablet Take 40 mg by mouth daily.   02/25/2024   potassium chloride  (KLOR-CON  M) 10 MEQ tablet Take 10 mEq by mouth daily.   02/25/2024    pregabalin (LYRICA) 25 MG capsule Take 25 mg by mouth 2 (two) times daily.   02/25/2024   senna-docusate (SENOKOT-S) 8.6-50 MG tablet Take 3 tablets by mouth at bedtime.   02/25/2024   Social History   Socioeconomic History   Marital status: Married    Spouse name: Not on file   Number of children: Not on file   Years of education: Not on file   Highest education level: Not on file  Occupational History   Not on file  Tobacco Use   Smoking status: Never   Smokeless tobacco: Never  Substance and Sexual Activity   Alcohol use: Yes    Alcohol/week: 1.0 standard drink of alcohol    Types: 1 Glasses of wine per week    Comment: occ.   Drug use: No   Sexual activity: Not on file  Other Topics Concern   Not on file  Social History Narrative   Not on file   Social Drivers of Health   Tobacco Use: Low Risk (02/26/2024)   Patient History    Smoking Tobacco Use: Never    Smokeless Tobacco Use: Never    Passive Exposure: Not on file  Financial Resource Strain: Low Risk  (02/09/2023)   Received from [facility]   Overall Financial Resource Strain (CARDIA)    Difficulty of Paying Living Expenses: Not hard at all  Food Insecurity: No Food Insecurity (02/28/2024)   Epic    Worried About Running Out of Food in the Last Year: Never true    Ran Out of Food in the Last Year: Never true  Transportation Needs: No Transportation Needs (02/28/2024)   Epic    Lack of Transportation (Medical): No    Lack of Transportation (Non-Medical): No  Physical Activity: Not on file  Stress: Not on file  Social Connections: Socially Integrated (02/28/2024)   Social Connection and Isolation Panel    Frequency of Communication with Friends and Family: More than three times a week    Frequency of Social Gatherings with Friends and Family: More than three times a week    Attends Religious Services: More than 4 times per year    Active Member of Clubs or Organizations: Yes    Attends  Banker Meetings: More than 4 times per year    Marital Status: Married  Catering Manager Violence: Not At Risk (02/28/2024)   Epic    Fear of Current or Ex-Partner: No    Emotionally Abused: No    Physically Abused: No    Sexually Abused: No  Depression (PHQ2-9): Low Risk (12/13/2023)   Depression (PHQ2-9)    PHQ-2 Score: 1  Alcohol Screen: Not on file  Housing: Low Risk (02/28/2024)   Epic  Unable to Pay for Housing in the Last Year: No    Number of Times Moved in the Last Year: 0    Homeless in the Last Year: No  Utilities: Not At Risk (02/28/2024)   Epic    Threatened with loss of utilities: No  Health Literacy: Not on file    Family History  Problem Relation Age of Onset   Breast cancer Neg Hx      Vitals:   02/28/24 2359 02/29/24 0416 02/29/24 0454 02/29/24 0726  BP: (!) 102/57 (!) 119/58  113/62  Pulse: 72 67  (!) 59  Resp: 20 20  17   Temp: 98.1 F (36.7 C) 97.8 F (36.6 C)  97.9 F (36.6 C)  TempSrc: Oral     SpO2: 92% 96%  92%  Weight:   77.6 kg   Height:        PHYSICAL EXAM General: Well appearing elderly female, well nourished, in no acute distress. HEENT: Normocephalic and atraumatic. Neck: No JVD.  Lungs: Normal respiratory effort on room air. Bibasilar crackles. Heart: HRRR. Normal S1 and S2 without gallops or murmurs.  Abdomen: Non-distended appearing.  Msk: Normal strength and tone for age. Extremities: Warm and well perfused. No clubbing, cyanosis. No edema.  Neuro: Alert and oriented X 3. Psych: Answers questions appropriately.   Labs: Basic Metabolic Panel: Recent Labs    02/28/24 0412 02/29/24 0519  NA 133* 133*  K 3.7 3.4*  CL 96* 96*  CO2 23 25  GLUCOSE 164* 100*  BUN 33* 33*  CREATININE 1.02* 0.91  CALCIUM  9.5 9.2  MG 2.1  --   PHOS 4.6  --    Liver Function Tests: Recent Labs    02/27/24 0358  AST 23  ALT 18  ALKPHOS 105  BILITOT <0.2  PROT 8.1  ALBUMIN 3.7   No results for input(s): LIPASE,  AMYLASE in the last 72 hours. CBC: Recent Labs    02/27/24 0358 02/29/24 0519  WBC 6.8 11.5*  HGB 10.1* 9.9*  HCT 31.0* 29.9*  MCV 86.8 85.4  PLT 228 269   Cardiac Enzymes: No results for input(s): CKTOTAL, CKMB, CKMBINDEX, TROPONINIHS in the last 72 hours. BNP: No results for input(s): BNP in the last 72 hours. D-Dimer: No results for input(s): DDIMER in the last 72 hours. Hemoglobin A1C: No results for input(s): HGBA1C in the last 72 hours. Fasting Lipid Panel: No results for input(s): CHOL, HDL, LDLCALC, TRIG, CHOLHDL, LDLDIRECT in the last 72 hours. Thyroid  Function Tests: No results for input(s): TSH, T4TOTAL, T3FREE, THYROIDAB in the last 72 hours.  Invalid input(s): FREET3 Anemia Panel: No results for input(s): VITAMINB12, FOLATE, FERRITIN, TIBC, IRON, RETICCTPCT in the last 72 hours.   Radiology: ECHOCARDIOGRAM COMPLETE Result Date: 02/27/2024    ECHOCARDIOGRAM REPORT   Patient Name:   Carly Peck Date of Exam: 02/27/2024 Medical Rec #:  [id number]     Height:       65.0 in Accession #:    [id number]    Weight:       175.5 lb Date of Birth:  03/06/1932      BSA:          1.871 m Patient Age:    89 years      BP:           121/73 mmHg Patient Gender: F             HR:           83  bpm. Exam Location:  [facility] Procedure: 2D Echo, Cardiac Doppler and Color Doppler (Both Spectral and Color            Flow Doppler were utilized during procedure). Indications:     CHF-acute diastolic I50.31  History:         Patient has prior history of Echocardiogram examinations, most                  recent 03/10/2022. Arrythmias:LBBB; Risk Factors:Dyslipidemia                  and Hypertension. Breast cancer.  Sonographer:     Christopher Furnace Referring Phys:  [id number] Yamile Roedl Diagnosing Phys: Marsa Dooms MD  Sonographer Comments: Suboptimal apical window. IMPRESSIONS  1. Left ventricular ejection fraction, by estimation, is 55 to 60%. The left  ventricle has normal function. The left ventricle has no regional wall motion abnormalities. Left ventricular diastolic parameters are consistent with Grade I diastolic dysfunction (impaired relaxation).  2. Right ventricular systolic function is normal. The right ventricular size is normal.  3. The mitral valve is normal in structure. No evidence of mitral valve regurgitation. No evidence of mitral stenosis.  4. The aortic valve is normal in structure. Aortic valve regurgitation is not visualized. No aortic stenosis is present.  5. The inferior vena cava is normal in size with greater than 50% respiratory variability, suggesting right atrial pressure of 3 mmHg. FINDINGS  Left Ventricle: Left ventricular ejection fraction, by estimation, is 55 to 60%. The left ventricle has normal function. The left ventricle has no regional wall motion abnormalities. Strain was performed and the global longitudinal strain is indeterminate. The left ventricular internal cavity size was normal in size. There is no left ventricular hypertrophy. Left ventricular diastolic parameters are consistent with Grade I diastolic dysfunction (impaired relaxation). Right Ventricle: The right ventricular size is normal. No increase in right ventricular wall thickness. Right ventricular systolic function is normal. Left Atrium: Left atrial size was normal in size. Right Atrium: Right atrial size was normal in size. Pericardium: There is no evidence of pericardial effusion. Mitral Valve: The mitral valve is normal in structure. No evidence of mitral valve regurgitation. No evidence of mitral valve stenosis. MV peak gradient, 4.0 mmHg. The mean mitral valve gradient is 3.0 mmHg. Tricuspid Valve: The tricuspid valve is normal in structure. Tricuspid valve regurgitation is not demonstrated. No evidence of tricuspid stenosis. Aortic Valve: The aortic valve is normal in structure. Aortic valve regurgitation is not visualized. No aortic stenosis is  present. Aortic valve mean gradient measures 4.7 mmHg. Aortic valve peak gradient measures 8.1 mmHg. Aortic valve area, by VTI measures 1.89 cm. Pulmonic Valve: The pulmonic valve was normal in structure. Pulmonic valve regurgitation is not visualized. No evidence of pulmonic stenosis. Aorta: The aortic root is normal in size and structure. Venous: The inferior vena cava is normal in size with greater than 50% respiratory variability, suggesting right atrial pressure of 3 mmHg. IAS/Shunts: No atrial level shunt detected by color flow Doppler. Additional Comments: 3D was performed not requiring image post processing on an independent workstation and was indeterminate.  LEFT VENTRICLE PLAX 2D LVIDd:         3.20 cm   Diastology LVIDs:         2.30 cm   LV e' medial:    5.22 cm/s LV PW:         1.30 cm   LV E/e' medial:  16.0 LV IVS:  1.10 cm   LV e' lateral:   6.20 cm/s LVOT diam:     2.00 cm   LV E/e' lateral: 13.5 LV SV:         52 LV SV Index:   28 LVOT Area:     3.14 cm LV IVRT:       92 msec  RIGHT VENTRICLE RV Basal diam:  3.20 cm RV Mid diam:    2.40 cm LEFT ATRIUM             Index        RIGHT ATRIUM           Index LA diam:        3.90 cm 2.08 cm/m   RA Area:     15.80 cm LA Vol (A2C):   58.9 ml 31.48 ml/m  RA Volume:   36.40 ml  19.45 ml/m LA Vol (A4C):   38.1 ml 20.36 ml/m LA Biplane Vol: 49.0 ml 26.19 ml/m  AORTIC VALVE AV Area (Vmax):    1.87 cm AV Area (Vmean):   2.01 cm AV Area (VTI):     1.89 cm AV Vmax:           142.67 cm/s AV Vmean:          99.333 cm/s AV VTI:            0.276 m AV Peak Grad:      8.1 mmHg AV Mean Grad:      4.7 mmHg LVOT Vmax:         84.90 cm/s LVOT Vmean:        63.500 cm/s LVOT VTI:          0.166 m LVOT/AV VTI ratio: 0.60  AORTA Ao Root diam: 3.00 cm MITRAL VALVE               TRICUSPID VALVE MV Area (PHT): 4.46 cm    TR Peak grad:   11.7 mmHg MV Area VTI:   2.32 cm    TR Vmax:        171.00 cm/s MV Peak grad:  4.0 mmHg MV Mean grad:  3.0 mmHg    SHUNTS MV  Vmax:       1.00 m/s    Systemic VTI:  0.17 m MV Vmean:      77.0 cm/s   Systemic Diam: 2.00 cm MV Decel Time: 170 msec MV E velocity: 83.60 cm/s MV A velocity: 85.70 cm/s MV E/A ratio:  0.98 Marsa Dooms MD Electronically signed by Marsa Dooms MD Signature Date/Time: 02/27/2024/4:53:31 PM    Final    DG Chest 2 View Result Date: 02/26/2024 CLINICAL DATA:  Shortness of breath. EXAM: CHEST - 2 VIEW COMPARISON:  03/17/2023 FINDINGS: Low volume film. The cardio pericardial silhouette is enlarged. Moderate hiatal hernia. There is pulmonary vascular congestion without overt pulmonary edema. Bandlike clustered nodularity overlies the upper chest, seen to be anterior to the patient on the lateral projection. IMPRESSION: 1. Low volume film with vascular congestion. 2. Moderate hiatal hernia. Electronically Signed   By: Camellia Candle M.D.   On: 02/26/2024 10:26    ECHO as above  TELEMETRY (personally reviewed): not on tele   EKG (personally reviewed): NSR rate 100 bpm  Data reviewed by me 02/29/2024: last 24h vitals tele labs imaging I/O ED provider note, admission H&P, hospitalist progress note  Principal Problem:   CHF (congestive heart failure) (HCC) Active Problems:   HLD (hyperlipidemia)   Benign essential  HTN   MGUS (monoclonal gammopathy of unknown significance)   Depression with anxiety   History of ischemic stroke   COPD with acute exacerbation (HCC)    ASSESSMENT AND PLAN:  Carly Peck is a 89 y.o. female  with a past medical history of HTN, HLD, anxiety/depression, left bundle branch block, breast cancer s/p radiation and chemotherapy, MGUS, tic [place], history of ischemic stroke without residual in December 2023 on aspirin , herpes zoster, dementia who presented to the ED on 02/26/2024 for shortness of breath and cough. Cardiology was consulted for further evaluation.   # Shortness of breath # Elevated BNP # Chronic LBBB # Hx CVA Patient presented with SOB, found  to have elevated BNP at 1400 and vascular congestion on CXR. Started on IV lasix  in the ED. Echo this admission with EF 55-60%, no WMAs, grade I diastolic dysfunction. -Continue PO lasix  20 mg daily. -Start spironolactone 12.5 mg daily. Continue home carvedilol  12.5 mg twice daily. Likely a poor candidate for SGLT2i.  -Continue atorvastatin  40 mg daily and aspirin  81 mg daily.  -Minimal troponin most consistent with demand/supply mismatch and not ACS.   This patient's plan of care was discussed and created with Dr. Ammon and he is in agreement.  Signed: Danita Bloch, PA-C  02/29/2024, 8:34 AM [facility]

## 2024-05-28 ENCOUNTER — Other Ambulatory Visit

## 2024-06-11 ENCOUNTER — Ambulatory Visit: Admitting: Internal Medicine
